# Patient Record
Sex: Female | Born: 1995 | Race: Black or African American | Hispanic: No | Marital: Single | Smoking: Current every day smoker
Health system: Southern US, Community
[De-identification: ages and names within clinical notes are randomized; demographics above are authoritative.]

## PROBLEM LIST (undated history)

## (undated) ENCOUNTER — Inpatient Hospital Stay (HOSPITAL_COMMUNITY): Payer: Self-pay

## (undated) ENCOUNTER — Emergency Department (HOSPITAL_COMMUNITY): Admission: EM | Payer: Medicaid Other | Source: Home / Self Care

## (undated) ENCOUNTER — Emergency Department (HOSPITAL_COMMUNITY): Discharge status: Absent without leave | Payer: Self-pay

## (undated) DIAGNOSIS — O1490 Unspecified pre-eclampsia, unspecified trimester: Secondary | ICD-10-CM

## (undated) DIAGNOSIS — N200 Calculus of kidney: Secondary | ICD-10-CM

## (undated) DIAGNOSIS — B999 Unspecified infectious disease: Secondary | ICD-10-CM

## (undated) DIAGNOSIS — R011 Cardiac murmur, unspecified: Secondary | ICD-10-CM

## (undated) DIAGNOSIS — J45909 Unspecified asthma, uncomplicated: Secondary | ICD-10-CM

## (undated) HISTORY — PX: NO PAST SURGERIES: SHX2092

## (undated) HISTORY — DX: Unspecified pre-eclampsia, unspecified trimester: O14.90

---
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
## 2013-01-31 ENCOUNTER — Emergency Department (HOSPITAL_COMMUNITY)
Admission: EM | Admit: 2013-01-31 | Discharge: 2013-01-31 | Disposition: A | Discharge status: Home / Self Care | Payer: Medicaid Other | Attending: Emergency Medicine | Admitting: Emergency Medicine

## 2013-01-31 ENCOUNTER — Encounter (HOSPITAL_COMMUNITY): Payer: Self-pay

## 2013-01-31 DIAGNOSIS — R011 Cardiac murmur, unspecified: Secondary | ICD-10-CM | POA: Insufficient documentation

## 2013-01-31 DIAGNOSIS — L259 Unspecified contact dermatitis, unspecified cause: Secondary | ICD-10-CM

## 2013-01-31 DIAGNOSIS — R21 Rash and other nonspecific skin eruption: Secondary | ICD-10-CM | POA: Insufficient documentation

## 2013-01-31 MED ORDER — METHYLPREDNISOLONE SODIUM SUCC 125 MG IJ SOLR
125.0000 mg | Freq: Once | INTRAMUSCULAR | Status: AC
  Administered 2013-01-31: 125 mg via INTRAMUSCULAR
  Filled 2013-01-31: qty 2

## 2013-01-31 MED ORDER — DIPHENHYDRAMINE HCL 25 MG PO CAPS
25.0000 mg | ORAL_CAPSULE | Freq: Once | ORAL | Status: AC
Start: 2013-01-31 — End: 2013-01-31
  Administered 2013-01-31: 25 mg via ORAL
  Filled 2013-01-31: qty 1

## 2013-01-31 NOTE — ED Provider Notes (Signed)
History  This chart was scribed for non-physician practitioner, Teressa Lower PA-C, working with Derwood Kaplan, MD by Ardeen Jourdain, ED Scribe. This patient was seen in room WTR6/WTR6 and the patient's care was started at 1655.  CSN: [id number]     Arrival date & time 01/31/13  1644   First MD Initiated Contact with Patient 01/31/13 1655     Chief Complaint  Patient presents with  . Facial Swelling  . Rash    Patient is a 17 y.o. female presenting with rash. The history is provided by the patient. No language interpreter was used.  Rash Location:  Face and shoulder/arm Facial rash location:  Face Shoulder/arm rash location:  L arm and R arm Quality: swelling   Severity:  Mild Onset quality:  Gradual Duration:  8 hours Timing:  Constant Progression:  Worsening Chronicity:  New Relieved by:  None tried Worsened by:  Nothing tried Ineffective treatments:  None tried Associated symptoms: no abdominal pain, no diarrhea, no fatigue, no fever, no headaches, no hoarse voice, no induration, no joint pain, no myalgias, no nausea, no periorbital edema, no shortness of breath, no sore throat, no throat swelling, no tongue swelling, no URI, not vomiting and not wheezing     HPI Comments: Cindy [name] is a 17 y.o. female who presents to the Emergency Department complaining of gradual onset, unchanging, constant facial swelling and rash to bilateral arms. Pt states she woke up this morning with the rash. She denies any new soaps, detergent, lotions and makeup. She reports taking Tylenol for the first time yesterday. She denies any trouble breathing, trouble swallowing and pain as associated symptoms.    Past Medical History  Diagnosis Date  . Heart murmur    No past surgical history on file. No family history on file. History  Substance Use Topics  . Smoking status: Never Smoker   . Smokeless tobacco: Not on file  . Alcohol Use: No   No OB history available.   Review of  Systems  Constitutional: Negative for fever and fatigue.  HENT: Positive for facial swelling. Negative for sore throat and hoarse voice.   Respiratory: Negative for shortness of breath and wheezing.   Gastrointestinal: Negative for nausea, vomiting, abdominal pain and diarrhea.  Musculoskeletal: Negative for myalgias and arthralgias.  Skin: Positive for rash.  Neurological: Negative for headaches.  All other systems reviewed and are negative.    Allergies  Review of patient's allergies indicates no known allergies.  Home Medications   Current Outpatient Rx  Name  Route  Sig  Dispense  Refill  . acetaminophen (TYLENOL) 500 MG tablet   Oral   Take 500 mg by mouth every 6 (six) hours as needed for pain.           Triage Vitals: BP 119/76  Pulse 99  Temp(Src) 99.6 F (37.6 C) (Oral)  Resp 21  SpO2 100%  Physical Exam  Nursing note and vitals reviewed. Constitutional: She is oriented to person, place, and time. She appears well-developed and well-nourished. No distress.  HENT:  Head: Normocephalic and atraumatic.  Mouth/Throat: Uvula is midline. No posterior oropharyngeal edema or posterior oropharyngeal erythema.  Mild facial swelling. No mucus al swelling. No oropharyngeal edema.   Eyes: EOM are normal. Pupils are equal, round, and reactive to light.  Neck: Normal range of motion. Neck supple. No tracheal deviation present.  Cardiovascular: Normal rate, regular rhythm and normal heart sounds.  Exam reveals no gallop and no friction rub.  No murmur heard. Pulmonary/Chest: Effort normal and breath sounds normal. No respiratory distress. She has no wheezes. She has no rales. She exhibits no tenderness.  Abdominal: Soft. She exhibits no distension.  Musculoskeletal: Normal range of motion. She exhibits no edema.  Neurological: She is alert and oriented to person, place, and time.  Skin: Skin is warm and dry.  Raised papules to upper extremities,face and trunk    Psychiatric: She has a normal mood and affect. Her behavior is normal.    ED Course   Procedures (including critical care time)  DIAGNOSTIC STUDIES: Oxygen Saturation is 100% on room air, normal by my interpretation.    COORDINATION OF CARE:  5:12 PM-Discussed treatment plan which includes steroids and benadryl with pt at bedside and pt agreed to plan.    Labs Reviewed - No data to display No results found. 1. Contact dermatitis     MDM  Rash consistent with allergic reaction:pt not having any respiratory symptoms:discussed with AV:[id number] relation to the acetaminophen dose taken yesterday as pt has never had in the past  I personally performed the services described in this documentation, which was scribed in my presence. The recorded information has been reviewed and is accurate.    Teressa Lower, NP 01/31/13 1753

## 2013-01-31 NOTE — ED Notes (Signed)
Pt reports she woke up with facial swelling, denies difficulty breathing or swallowing. Also reports rash on arms. Denies pain

## 2013-01-31 NOTE — ED Notes (Signed)
Facial swelling since 8am today denies trouble breathing

## 2013-02-03 NOTE — ED Provider Notes (Signed)
Medical screening examination/treatment/procedure(s) were performed by non-physician practitioner and as supervising physician I was immediately available for consultation/collaboration.  Helmi Hechavarria, MD 02/03/13 1423 

## 2013-03-22 ENCOUNTER — Emergency Department (HOSPITAL_COMMUNITY)
Admission: EM | Admit: 2013-03-22 | Discharge: 2013-03-23 | Disposition: A | Discharge status: Home / Self Care | Payer: No Typology Code available for payment source | Attending: Emergency Medicine | Admitting: Emergency Medicine

## 2013-03-22 ENCOUNTER — Encounter (HOSPITAL_COMMUNITY): Payer: Self-pay | Admitting: *Deleted

## 2013-03-22 DIAGNOSIS — S8001XA Contusion of right knee, initial encounter: Secondary | ICD-10-CM

## 2013-03-22 DIAGNOSIS — S8000XA Contusion of unspecified knee, initial encounter: Secondary | ICD-10-CM | POA: Insufficient documentation

## 2013-03-22 DIAGNOSIS — S139XXA Sprain of joints and ligaments of unspecified parts of neck, initial encounter: Secondary | ICD-10-CM | POA: Insufficient documentation

## 2013-03-22 DIAGNOSIS — Z79899 Other long term (current) drug therapy: Secondary | ICD-10-CM | POA: Insufficient documentation

## 2013-03-22 DIAGNOSIS — Y92410 Unspecified street and highway as the place of occurrence of the external cause: Secondary | ICD-10-CM | POA: Insufficient documentation

## 2013-03-22 DIAGNOSIS — R011 Cardiac murmur, unspecified: Secondary | ICD-10-CM | POA: Insufficient documentation

## 2013-03-22 DIAGNOSIS — Y9389 Activity, other specified: Secondary | ICD-10-CM | POA: Insufficient documentation

## 2013-03-22 DIAGNOSIS — S161XXA Strain of muscle, fascia and tendon at neck level, initial encounter: Secondary | ICD-10-CM
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
## 2013-03-23 MED ORDER — IBUPROFEN 200 MG PO TABS
600.0000 mg | ORAL_TABLET | Freq: Once | ORAL | Status: AC
  Administered 2013-03-23: 600 mg via ORAL
  Filled 2013-03-23: qty 3

## 2013-03-23 MED ORDER — METHOCARBAMOL 500 MG PO TABS
500.0000 mg | ORAL_TABLET | Freq: Two times a day (BID) | ORAL | Status: DC
Start: 2013-03-23 — End: 2013-07-03

## 2013-03-23 MED ORDER — IBUPROFEN 600 MG PO TABS: 600.0000 mg | ORAL_TABLET | Freq: Four times a day (QID) | ORAL | Status: DC | PRN

## 2013-03-23 NOTE — ED Provider Notes (Signed)
Medical screening examination/treatment/procedure(s) were performed by non-physician practitioner and as supervising physician I was immediately available for consultation/collaboration.  Tobin Witucki L Shakiara Lukic, MD 03/23/13 0448 

## 2013-07-03 ENCOUNTER — Encounter (HOSPITAL_COMMUNITY): Payer: Self-pay | Admitting: Emergency Medicine

## 2013-07-03 ENCOUNTER — Emergency Department (HOSPITAL_COMMUNITY)
Admission: EM | Admit: 2013-07-03 | Discharge: 2013-07-03 | Disposition: A | Discharge status: Home / Self Care | Payer: Medicaid Other | Attending: Emergency Medicine | Admitting: Emergency Medicine

## 2013-07-03 DIAGNOSIS — Z3202 Encounter for pregnancy test, result negative: Secondary | ICD-10-CM | POA: Insufficient documentation

## 2013-07-03 DIAGNOSIS — R809 Proteinuria, unspecified: Secondary | ICD-10-CM | POA: Insufficient documentation

## 2013-07-03 DIAGNOSIS — N390 Urinary tract infection, site not specified: Secondary | ICD-10-CM | POA: Insufficient documentation

## 2013-07-03 DIAGNOSIS — R011 Cardiac murmur, unspecified: Secondary | ICD-10-CM | POA: Insufficient documentation

## 2013-07-03 LAB — URINALYSIS, ROUTINE W REFLEX MICROSCOPIC
BILIRUBIN URINE: NEGATIVE
GLUCOSE, UA: NEGATIVE mg/dL
KETONES UR: 15 mg/dL — AB
Nitrite: POSITIVE — AB
PH: 6 (ref 5.0–8.0)
Protein, ur: >300 mg/dL — AB
Specific Gravity, Urine: 1.028 (ref 1.005–1.030)
Urobilinogen, UA: 0.2 mg/dL (ref 0.0–1.0)

## 2013-07-03 LAB — URINE MICROSCOPIC-ADD ON

## 2013-07-03 LAB — POCT PREGNANCY, URINE: Preg Test, Ur: NEGATIVE

## 2013-07-03 MED ORDER — PHENAZOPYRIDINE HCL 200 MG PO TABS: 200.0000 mg | ORAL_TABLET | Freq: Three times a day (TID) | ORAL | Status: DC

## 2013-07-03 MED ORDER — SULFAMETHOXAZOLE-TRIMETHOPRIM 800-160 MG PO TABS: 1.0000 | ORAL_TABLET | Freq: Two times a day (BID) | ORAL | Status: DC

## 2013-07-03 NOTE — Discharge Instructions (Signed)
You have been seen today for your complaint of pain with urination. Your lab work showed urine infection. Your discharge medications include: 1)Bactrim Please take all of your antibiotics until finished!   You may develop abdominal discomfort or diarrhea from the antibiotic.  You may help offset this with probiotics which you can buy or get in yogurt. Do not eat  or take the probiotics until 2 hours after your antibiotic.  2) Pyridium This medication will help relieve pain and burning but does not treat the infection.  Make sure that you wear a panty liner as it may stain your underwear.   There was protein in your urine. This may be a result of the infection and may resolve, however you will need to follow up in the next to weeks to recheck your urine with your primary care doctor.  Home care instructions are as follows:  1) please drink plenty of water, avoid tea and beverages with caffeine like coffee or soda 2) if you are sexually active, ,make sure to urinate immediately after intercourse Follow up with: your doctor or the emergency department Please seek immediate medical care if you develop any of the following symptoms: SEEK MEDICAL CARE IF:  You have back pain.  You develop a fever.  Your symptoms do not begin to resolve within 3 days.  SEEK IMMEDIATE MEDICAL CARE IF:  You have severe back pain or lower abdominal pain.  You develop chills.  You have nausea or vomiting.  You have continued burning or discomfort with urination.   Proteinuria Proteinuria is a condition in which urine contains more protein than is normal. Proteinuria is either a sign that your body is producing too much protein or a sign that there is a problem with the kidneys. Healthy kidneys prevent most substances that the body needs, including proteins, from leaving the bloodstream and ending up in urine. CAUSES  Proteinuria may be caused by a temporary event or condition such as stress, exercise, or fever,  and go away on its own. Proteinuria may also be a symptom of a more serious condition or disease. Causes of proteinuria include:  A kidney disease caused by:  Diabetes.  High blood pressure (hypertension).   A disease that affects the immune system, such as lupus.  A genetic disease, such as Alport's syndrome.  Medicines that damage the kidneys, such as long-term nonsteroidal anti-inflammatory drugs (NSAIDs).  Poisoning or exposure to toxic substances.  A reoccurring kidney or urinary infection.  Excess protein production in the body caused by:  Multiple myeloma.  Amyloidosis. SYMPTOMS You may have proteinuria without having noticeable symptoms. If there is a large amount of protein in your urine, your urine may look foamy. You may also notice swelling (edema) in your hands, feet, abdomen, or face. DIAGNOSIS To determine whether you have proteinuria, you will need to provide a urine sample. Your urine will then be tested for too much protein and the main blood protein albumin. If your test shows that you have proteinuria, you may need to take additional tests to determine its cause, how much protein is in your urine, and what type of protein is being lost. Tests may include:  Blood tests.  Urine tests.  A blood pressure measurement.  Imaging tests. TREATMENT  Treatment will depend on the cause of your proteinuria. Your caregiver will discuss treatment options with you after you have been diagnosed. If your proteinuria is mild or temporary, no treatment may be necessary. HOME CARE INSTRUCTIONS Ask  your caregiver if monitoring the level of protein in your urine at home using simple testing strips is appropriate for you. Early detection of proteinuria can lead to early and often successful treatment of the condition causing it. Document Released: 07/31/2005 Document Revised: 03/04/2012 Document Reviewed: 11/08/2011 [facility] Patient Information 2014 [place].

## 2013-07-03 NOTE — ED Provider Notes (Signed)
CSN: [id number]     Arrival date & time 07/03/13  1052 History   First MD Initiated Contact with Patient 07/03/13 1114     Chief Complaint  Patient presents with  . Dysuria   (Consider location/radiation/quality/duration/timing/severity/associated sxs/prior Treatment) HPI This is a 18 year old female presents the emergency department with chief complaint of symptoms of UTI.  Patient states that for the past few days she has had frequency of urination, urgency to urinate, dysuria, and suprapubic pain.  Patient denies hematuria or flank pain.  She has a previous history of urinary tract infections and states that this feels similar to her previous.  The patient denies any vaginal symptoms.  She lives in a monogamous relationship with one partner.  She is not using birth control.  She uses protection "most of the time."  Patient denies pain with sexual intercourse. Denies fevers, chills, myalgias, arthralgias. Denies DOE, SOB, chest tightness or pressure, radiation to left arm, jaw or back, or diaphoresis.  Denies headaches, light headedness, weakness, visual disturbances. Denies abdominal pain, nausea, vomiting, diarrhea or constipation.    Past Medical History  Diagnosis Date  . Heart murmur    History reviewed. No pertinent past surgical history. History reviewed. No pertinent family history. History  Substance Use Topics  . Smoking status: Never Smoker   . Smokeless tobacco: Not on file  . Alcohol Use: No   OB History   Grav Para Term Preterm Abortions TAB SAB Ect Mult Living                 Review of Systems Ten systems reviewed and are negative for acute change, except as noted in the HPI.   Allergies  Acetaminophen  Home Medications  No current outpatient prescriptions on file. BP 111/58  Pulse 70  Temp(Src) 98.4 F (36.9 C) (Oral)  Resp 18  Wt 120 lb (54.432 kg)  SpO2 100%  LMP 06/08/2013 Physical Exam Physical Exam  Nursing note and vitals  reviewed. Constitutional: She is oriented to person, place, and time. She appears well-developed and well-nourished. No distress.  HENT:  Head: Normocephalic and atraumatic.  Eyes: Conjunctivae normal and EOM are normal. Pupils are equal, round, and reactive to light. No scleral icterus.  Neck: Normal range of motion.  Cardiovascular: Normal rate, regular rhythm and normal heart sounds.  Exam reveals no gallop and no friction rub.   No murmur heard. Pulmonary/Chest: Effort normal and breath sounds normal. No respiratory distress.  Abdominal: Soft. Bowel sounds are normal. She exhibits no distension and no mass. There is no tenderness. There is no guarding.  Neurological: She is alert and oriented to person, place, and time.  Skin: Skin is warm and dry. She is not diaphoretic.    ED Course  Procedures (including critical care time) Labs Review Labs Reviewed  URINALYSIS, ROUTINE W REFLEX MICROSCOPIC  POCT PREGNANCY, URINE   Imaging Review No results found.  EKG Interpretation   None       MDM   1. UTI (lower urinary tract infection)   2. Proteinuria    Patient here with complaint of urinary symptoms.  Pregnancy is negative.  UA is pending.  .1:09 PM Filed Vitals:   07/03/13 1106 07/03/13 1255  BP: 111/58 110/57  Pulse: 70 75  Temp: 98.4 F (36.9 C) 98.3 F (36.8 C)  TempSrc: Oral Oral  Resp: 18   Weight: 120 lb (54.432 kg)   SpO2: 100% 100%   Recently the urinary tract infection on urinalysis.  She also has routine area greater than 300.  As advised the patient that she will need to follow up with her primary care for repeat urinalysis in the next week or 2.  Patient will be discharged with Bactrim and Pyridium for UTI. The patient appears reasonably screened and/or stabilized for discharge and I doubt any other medical condition or other [facility] requiring further screening, evaluation, or treatment in the ED at this time prior to discharge.   Arthor Captain,  PA-C 07/03/13 1349

## 2013-07-03 NOTE — ED Provider Notes (Signed)
Medical screening examination/treatment/procedure(s) were conducted as a shared visit with non-physician practitioner(s) and myself.  I personally evaluated the patient during the encounter.  EKG Interpretation   None         Darlys Galesavid Corvette Orser, MD 07/03/13 2106

## 2013-07-03 NOTE — ED Notes (Signed)
Pt in from home c/o dysuria & cloudy urine onset x 2 days, denies hematuria, denies back pain

## 2013-07-06 ENCOUNTER — Ambulatory Visit: Payer: Medicaid Other

## 2013-07-06 LAB — URINE CULTURE

## 2013-08-04 ENCOUNTER — Emergency Department (HOSPITAL_COMMUNITY): Payer: Medicaid Other

## 2013-08-04 ENCOUNTER — Emergency Department (HOSPITAL_COMMUNITY)
Admission: EM | Admit: 2013-08-04 | Discharge: 2013-08-04 | Disposition: A | Discharge status: Home / Self Care | Payer: Medicaid Other | Attending: Emergency Medicine | Admitting: Emergency Medicine

## 2013-08-04 ENCOUNTER — Encounter (HOSPITAL_COMMUNITY): Payer: Self-pay | Admitting: Emergency Medicine

## 2013-08-04 DIAGNOSIS — R011 Cardiac murmur, unspecified: Secondary | ICD-10-CM | POA: Insufficient documentation

## 2013-08-04 DIAGNOSIS — N200 Calculus of kidney: Secondary | ICD-10-CM

## 2013-08-04 DIAGNOSIS — Z3202 Encounter for pregnancy test, result negative: Secondary | ICD-10-CM | POA: Insufficient documentation

## 2013-08-04 LAB — CBC WITH DIFFERENTIAL/PLATELET
BASOS ABS: 0 10*3/uL (ref 0.0–0.1)
Basophils Relative: 1 % (ref 0–1)
EOS PCT: 1 % (ref 0–5)
Eosinophils Absolute: 0 10*3/uL (ref 0.0–0.7)
HEMATOCRIT: 31.1 % — AB (ref 36.0–46.0)
Hemoglobin: 10.1 g/dL — ABNORMAL LOW (ref 12.0–15.0)
LYMPHS ABS: 2.3 10*3/uL (ref 0.7–4.0)
LYMPHS PCT: 37 % (ref 12–46)
MCH: 27.7 pg (ref 26.0–34.0)
MCHC: 32.5 g/dL (ref 30.0–36.0)
MCV: 85.2 fL (ref 78.0–100.0)
MONO ABS: 0.6 10*3/uL (ref 0.1–1.0)
Monocytes Relative: 10 % (ref 3–12)
NEUTROS ABS: 3.2 10*3/uL (ref 1.7–7.7)
Neutrophils Relative %: 52 % (ref 43–77)
Platelets: 350 10*3/uL (ref 150–400)
RBC: 3.65 MIL/uL — AB (ref 3.87–5.11)
RDW: 15.8 % — ABNORMAL HIGH (ref 11.5–15.5)
WBC: 6.2 10*3/uL (ref 4.0–10.5)

## 2013-08-04 LAB — URINALYSIS, ROUTINE W REFLEX MICROSCOPIC
Bilirubin Urine: NEGATIVE
GLUCOSE, UA: NEGATIVE mg/dL
HGB URINE DIPSTICK: NEGATIVE
KETONES UR: NEGATIVE mg/dL
Leukocytes, UA: NEGATIVE
Nitrite: NEGATIVE
PROTEIN: NEGATIVE mg/dL
Specific Gravity, Urine: 1.024 (ref 1.005–1.030)
UROBILINOGEN UA: 0.2 mg/dL (ref 0.0–1.0)
pH: 8 (ref 5.0–8.0)

## 2013-08-04 LAB — POCT PREGNANCY, URINE: PREG TEST UR: NEGATIVE

## 2013-08-04 LAB — COMPREHENSIVE METABOLIC PANEL
ALT: 11 U/L (ref 0–35)
AST: 19 U/L (ref 0–37)
Albumin: 4 g/dL (ref 3.5–5.2)
Alkaline Phosphatase: 54 U/L (ref 39–117)
BILIRUBIN TOTAL: 0.3 mg/dL (ref 0.3–1.2)
BUN: 9 mg/dL (ref 6–23)
CALCIUM: 8.8 mg/dL (ref 8.4–10.5)
CHLORIDE: 104 meq/L (ref 96–112)
CO2: 23 meq/L (ref 19–32)
CREATININE: 0.85 mg/dL (ref 0.50–1.10)
GLUCOSE: 100 mg/dL — AB (ref 70–99)
Potassium: 3.8 mEq/L (ref 3.7–5.3)
SODIUM: 140 meq/L (ref 137–147)
Total Protein: 7.5 g/dL (ref 6.0–8.3)

## 2013-08-04 LAB — LIPASE, BLOOD: Lipase: 29 U/L (ref 11–59)

## 2013-08-04 MED ORDER — KETOROLAC TROMETHAMINE 30 MG/ML IJ SOLN
30.0000 mg | Freq: Once | INTRAMUSCULAR | Status: AC
  Administered 2013-08-04: 30 mg via INTRAVENOUS
  Filled 2013-08-04: qty 1

## 2013-08-04 MED ORDER — MORPHINE SULFATE 4 MG/ML IJ SOLN
4.0000 mg | Freq: Once | INTRAMUSCULAR | Status: AC
  Administered 2013-08-04: 4 mg via INTRAVENOUS
  Filled 2013-08-04: qty 1

## 2013-08-04 MED ORDER — NAPROXEN 500 MG PO TABS: 500.0000 mg | ORAL_TABLET | Freq: Two times a day (BID) | ORAL | Status: DC

## 2013-08-04 MED ORDER — TAMSULOSIN HCL 0.4 MG PO CAPS: 0.4000 mg | ORAL_CAPSULE | Freq: Every day | ORAL | Status: DC

## 2013-08-04 MED ORDER — ONDANSETRON HCL 4 MG/2ML IJ SOLN
4.0000 mg | Freq: Once | INTRAMUSCULAR | Status: AC
  Administered 2013-08-04: 4 mg via INTRAVENOUS
  Filled 2013-08-04: qty 2

## 2013-08-04 NOTE — ED Notes (Signed)
Pt given sprite per request 

## 2013-08-04 NOTE — ED Notes (Signed)
Pt from home c/o right upper quadrant abdominal pain that started today, pt states she started her period today and thought the pains were from her menstral but states that the pain is more severe and different. Pt denies any n/v/d, states she is having a normal period. Alert and oriented,nad noted.

## 2013-08-04 NOTE — ED Notes (Signed)
Pt escorted to restroom with little difficulty.

## 2013-08-04 NOTE — ED Notes (Signed)
Unable to obtain e-signature due to signature pad not working.  Pt verbalized understanding of d/c and f/u.  No additional questions by pt regarding d/c.  VSS.  Pt ambulatory at discharge.  Ride called.

## 2013-08-04 NOTE — ED Notes (Signed)
Patient unable to urinate for urinalysis at this moment

## 2013-08-04 NOTE — Discharge Instructions (Signed)
Kidney Stones  Kidney stones (urolithiasis) are deposits that form inside your kidneys. The intense pain is caused by the stone moving through the urinary tract. When the stone moves, the ureter goes into spasm around the stone. The stone is usually passed in the urine.   CAUSES   · A disorder that makes certain neck glands produce too much parathyroid hormone (primary hyperparathyroidism).  · A buildup of uric acid crystals, similar to gout in your joints.  · Narrowing (stricture) of the ureter.  · A kidney obstruction present at birth (congenital obstruction).  · Previous surgery on the kidney or ureters.  · Numerous kidney infections.  SYMPTOMS   · Feeling sick to your stomach (nauseous).  · Throwing up (vomiting).  · Blood in the urine (hematuria).  · Pain that usually spreads (radiates) to the groin.  · Frequency or urgency of urination.  DIAGNOSIS   · Taking a history and physical exam.  · Blood or urine tests.  · CT scan.  · Occasionally, an examination of the inside of the urinary bladder (cystoscopy) is performed.  TREATMENT   · Observation.  · Increasing your fluid intake.  · Extracorporeal shock wave lithotripsy This is a noninvasive procedure that uses shock waves to break up kidney stones.  · Surgery may be needed if you have severe pain or persistent obstruction. There are various surgical procedures. Most of the procedures are performed with the use of small instruments. Only small incisions are needed to accommodate these instruments, so recovery time is minimized.  The size, location, and chemical composition are all important variables that will determine the proper choice of action for you. Talk to your health care provider to better understand your situation so that you will minimize the risk of injury to yourself and your kidney.   HOME CARE INSTRUCTIONS   · Drink enough water and fluids to keep your urine clear or pale yellow. This will help you to pass the stone or stone fragments.  · Strain  all urine through the provided strainer. Keep all particulate matter and stones for your health care provider to see. The stone causing the pain may be as small as a grain of salt. It is very important to use the strainer each and every time you pass your urine. The collection of your stone will allow your health care provider to analyze it and verify that a stone has actually passed. The stone analysis will often identify what you can do to reduce the incidence of recurrences.  · Only take over-the-counter or prescription medicines for pain, discomfort, or fever as directed by your health care provider.  · Make a follow-up appointment with your health care provider as directed.  · Get follow-up X-rays if required. The absence of pain does not always mean that the stone has passed. It may have only stopped moving. If the urine remains completely obstructed, it can cause loss of kidney function or even complete destruction of the kidney. It is your responsibility to make sure X-rays and follow-ups are completed. Ultrasounds of the kidney can show blockages and the status of the kidney. Ultrasounds are not associated with any radiation and can be performed easily in a matter of minutes.  SEEK MEDICAL CARE IF:  · You experience pain that is progressive and unresponsive to any pain medicine you have been prescribed.  SEEK IMMEDIATE MEDICAL CARE IF:   · Pain cannot be controlled with the prescribed medicine.  · You have a fever   or shaking chills.  · The severity or intensity of pain increases over 18 hours and is not relieved by pain medicine.  · You develop a new onset of abdominal pain.  · You feel faint or pass out.  · You are unable to urinate.  MAKE SURE YOU:   · Understand these instructions.  · Will watch your condition.  · Will get help right away if you are not doing well or get worse.  Document Released: 06/10/2005 Document Revised: 02/10/2013 Document Reviewed: 11/11/2012  [employer]® Patient Information ©2014  [employer].

## 2013-08-04 NOTE — ED Provider Notes (Signed)
CSN: [id number]     Arrival date & time 08/04/13  1237 History   First MD Initiated Contact with Patient 08/04/13 1335     Chief Complaint  Patient presents with  . Abdominal Pain     (Consider location/radiation/quality/duration/timing/severity/associated sxs/prior Treatment) Patient is a 18 y.o. female presenting with abdominal pain. The history is provided by the patient.  Abdominal Pain  patient here complaining of 24 hours her right upper quadrant pain that began suddenly at rest. She denies any associated nodular vomiting or diarrhea. No fever or chills. No prior history of same and nothing makes her symptoms better or worse. No treatment used for this prior to arrival. Pain characterized as sharp. No associated pleurisy. No dysuria or hematuria. Symptoms have been persistently worse.  Past Medical History  Diagnosis Date  . Heart murmur    History reviewed. No pertinent past surgical history. No family history on file. History  Substance Use Topics  . Smoking status: Never Smoker   . Smokeless tobacco: Not on file  . Alcohol Use: No   OB History   Grav Para Term Preterm Abortions TAB SAB Ect Mult Living                 Review of Systems  Gastrointestinal: Positive for abdominal pain.  All other systems reviewed and are negative.      Allergies  Acetaminophen  Home Medications   Current Outpatient Rx  Name  Route  Sig  Dispense  Refill  . ibuprofen (ADVIL,MOTRIN) 200 MG tablet   Oral   Take 200 mg by mouth every 6 (six) hours as needed for moderate pain.          BP 111/62  Pulse 71  Temp(Src) 99.2 F (37.3 C) (Oral)  Resp 16  SpO2 100%  LMP 08/03/2013 Physical Exam  Nursing note and vitals reviewed. Constitutional: She is oriented to person, place, and time. She appears well-developed and well-nourished.  Non-toxic appearance. No distress.  HENT:  Head: Normocephalic and atraumatic.  Eyes: Conjunctivae, EOM and lids are normal. Pupils are equal,  round, and reactive to light.  Neck: Normal range of motion. Neck supple. No tracheal deviation present. No mass present.  Cardiovascular: Normal rate, regular rhythm and normal heart sounds.  Exam reveals no gallop.   No murmur heard. Pulmonary/Chest: Effort normal and breath sounds normal. No stridor. No respiratory distress. She has no decreased breath sounds. She has no wheezes. She has no rhonchi. She has no rales.  Abdominal: Soft. Normal appearance and bowel sounds are normal. She exhibits no distension. There is tenderness in the right upper quadrant. There is guarding. There is no rigidity, no rebound and no CVA tenderness.    Musculoskeletal: Normal range of motion. She exhibits no edema and no tenderness.  Neurological: She is alert and oriented to person, place, and time. She has normal strength. No cranial nerve deficit or sensory deficit. GCS eye subscore is 4. GCS verbal subscore is 5. GCS motor subscore is 6.  Skin: Skin is warm and dry. No abrasion and no rash noted.  Psychiatric: She has a normal mood and affect. Her speech is normal and behavior is normal.    ED Course  Procedures (including critical care time) Labs Review Labs Reviewed  CBC WITH DIFFERENTIAL - Abnormal; Notable for the following:    RBC 3.65 (*)    Hemoglobin 10.1 (*)    HCT 31.1 (*)    RDW 15.8 (*)    All  other components within normal limits  COMPREHENSIVE METABOLIC PANEL  LIPASE, BLOOD  URINALYSIS, ROUTINE W REFLEX MICROSCOPIC   Imaging Review No results found.  EKG Interpretation   None       MDM   Final diagnoses:  None    Patient given morphine for pain here. We assessed and pain continues. Abdominal ultrasound was suspicious for right-sided renal stone and this is confirmed by CT. Patient to be given referral to urology on call as well as pain medication.    Toy Baker, MD 08/04/13 [phone]

## 2013-08-04 NOTE — ED Notes (Signed)
Pt reminded of urine specimen needed, states she cannot go at this time.

## 2013-08-04 NOTE — ED Notes (Signed)
CT notified of preg test results.

## 2013-08-11 ENCOUNTER — Ambulatory Visit: Payer: Medicaid Other

## 2013-09-22 ENCOUNTER — Encounter (HOSPITAL_COMMUNITY): Payer: Self-pay | Admitting: Emergency Medicine

## 2013-09-22 ENCOUNTER — Emergency Department (HOSPITAL_COMMUNITY)
Admission: EM | Admit: 2013-09-22 | Discharge: 2013-09-22 | Disposition: A | Discharge status: Home / Self Care | Payer: Medicaid Other | Attending: Emergency Medicine | Admitting: Emergency Medicine

## 2013-09-22 DIAGNOSIS — N909 Noninflammatory disorder of vulva and perineum, unspecified: Secondary | ICD-10-CM

## 2013-09-22 DIAGNOSIS — N9089 Other specified noninflammatory disorders of vulva and perineum: Secondary | ICD-10-CM | POA: Insufficient documentation

## 2013-09-22 DIAGNOSIS — Z3202 Encounter for pregnancy test, result negative: Secondary | ICD-10-CM | POA: Insufficient documentation

## 2013-09-22 DIAGNOSIS — N72 Inflammatory disease of cervix uteri: Secondary | ICD-10-CM | POA: Insufficient documentation

## 2013-09-22 DIAGNOSIS — R300 Dysuria: Secondary | ICD-10-CM | POA: Insufficient documentation

## 2013-09-22 LAB — URINALYSIS, ROUTINE W REFLEX MICROSCOPIC
BILIRUBIN URINE: NEGATIVE
Glucose, UA: NEGATIVE mg/dL
HGB URINE DIPSTICK: NEGATIVE
Ketones, ur: NEGATIVE mg/dL
Nitrite: NEGATIVE
PROTEIN: NEGATIVE mg/dL
Specific Gravity, Urine: 1.019 (ref 1.005–1.030)
UROBILINOGEN UA: 0.2 mg/dL (ref 0.0–1.0)
pH: 7 (ref 5.0–8.0)

## 2013-09-22 LAB — URINE MICROSCOPIC-ADD ON

## 2013-09-22 LAB — WET PREP, GENITAL
TRICH WET PREP: NONE SEEN
Yeast Wet Prep HPF POC: NONE SEEN

## 2013-09-22 LAB — POC URINE PREG, ED: Preg Test, Ur: NEGATIVE

## 2013-09-22 MED ORDER — CEFTRIAXONE SODIUM 250 MG IJ SOLR
250.0000 mg | Freq: Once | INTRAMUSCULAR | Status: AC
  Administered 2013-09-22: 250 mg via INTRAMUSCULAR
  Filled 2013-09-22: qty 250

## 2013-09-22 MED ORDER — AZITHROMYCIN 250 MG PO TABS
1000.0000 mg | ORAL_TABLET | Freq: Once | ORAL | Status: AC
  Administered 2013-09-22: 1000 mg via ORAL
  Filled 2013-09-22: qty 4

## 2013-09-22 MED ORDER — LIDOCAINE HCL (PF) 1 % IJ SOLN
INTRAMUSCULAR | Status: AC
  Administered 2013-09-22: 0.9 mL
  Filled 2013-09-22: qty 5

## 2013-09-22 MED ORDER — LIDOCAINE 4 % EX CREA: 1.0000 "application " | TOPICAL_CREAM | CUTANEOUS | Status: DC | PRN

## 2013-09-22 MED ORDER — VALACYCLOVIR HCL 1 G PO TABS: 1000.0000 mg | ORAL_TABLET | Freq: Two times a day (BID) | ORAL | Status: AC

## 2013-09-22 NOTE — ED Notes (Signed)
PT c/o vaginal itching and discharge. Upon assessment, few small reddened blister looking areas noted to R side of labia. Thick white discharge present from vaginal area. LMP last week. Last date of intercourse yesterday.

## 2013-09-22 NOTE — ED Notes (Signed)
Pt in c/o rash to vaginal area and vaginal discharge, seen for same last week and OBGYN and states she was told everything was normal but states symptoms have continued

## 2013-09-22 NOTE — Discharge Instructions (Signed)
You cultures for a STDs including herpes are pending. You will be called if abnormal. Take valtrex as prescribed for possible herpes infection. Apply lidocaine cream for pain. Follow up with OB/GYN for recheck.    Gastritis, Adult Gastritis is soreness and swelling (inflammation) of the lining of the stomach. Gastritis can develop as a sudden onset (acute) or long-term (chronic) condition. If gastritis is not treated, it can lead to stomach bleeding and ulcers. CAUSES  Gastritis occurs when the stomach lining is weak or damaged. Digestive juices from the stomach then inflame the weakened stomach lining. The stomach lining may be weak or damaged due to viral or bacterial infections. One common bacterial infection is the Helicobacter pylori infection. Gastritis can also result from excessive alcohol consumption, taking certain medicines, or having too much acid in the stomach.  SYMPTOMS  In some cases, there are no symptoms. When symptoms are present, they may include:  Pain or a burning sensation in the upper abdomen.  Nausea.  Vomiting.  An uncomfortable feeling of fullness after eating. DIAGNOSIS  Your caregiver may suspect you have gastritis based on your symptoms and a physical exam. To determine the cause of your gastritis, your caregiver may perform the following:  Blood or stool tests to check for the H pylori bacterium.  Gastroscopy. A thin, flexible tube (endoscope) is passed down the esophagus and into the stomach. The endoscope has a light and camera on the end. Your caregiver uses the endoscope to view the inside of the stomach.  Taking a tissue sample (biopsy) from the stomach to examine under a microscope. TREATMENT  Depending on the cause of your gastritis, medicines may be prescribed. If you have a bacterial infection, such as an H pylori infection, antibiotics may be given. If your gastritis is caused by too much acid in the stomach, H2 blockers or antacids may be given.  Your caregiver may recommend that you stop taking aspirin, ibuprofen, or other nonsteroidal anti-inflammatory drugs (NSAIDs). HOME CARE INSTRUCTIONS  Only take over-the-counter or prescription medicines as directed by your caregiver.  If you were given antibiotic medicines, take them as directed. Finish them even if you start to feel better.  Drink enough fluids to keep your urine clear or pale yellow.  Avoid foods and drinks that make your symptoms worse, such as:  Caffeine or alcoholic drinks.  Chocolate.  Peppermint or mint flavorings.  Garlic and onions.  Spicy foods.  Citrus fruits, such as oranges, lemons, or limes.  Tomato-based foods such as sauce, chili, salsa, and pizza.  Fried and fatty foods.  Eat small, frequent meals instead of large meals. SEEK IMMEDIATE MEDICAL CARE IF:   You have black or dark red stools.  You vomit blood or material that looks like coffee grounds.  You are unable to keep fluids down.  Your abdominal pain gets worse.  You have a fever.  You do not feel better after 1 week.  You have any other questions or concerns. MAKE SURE YOU:  Understand these instructions.  Will watch your condition.  Will get help right away if you are not doing well or get worse. Document Released: 06/04/2001 Document Revised: 12/10/2011 Document Reviewed: 07/24/2011 [facility] FExitCare Patient Information 2014 [place], [place].

## 2013-09-22 NOTE — ED Provider Notes (Signed)
CSN: [id number]     Arrival date & time 09/22/13  1643 History   First MD Initiated Contact with Patient 09/22/13 1808     Chief Complaint  Patient presents with  . Rash  . Vaginal Discharge     (Consider location/radiation/quality/duration/timing/severity/associated sxs/prior Treatment) HPI Cindy [name] is a 18 y.o. female who presents emergency department complaining of abnormal vaginal discharge and vulvar rash. Patient states that she has had her discharge for approximately 2 weeks. She was seen by OB/GYN Dr. last week, she had a pelvic exam done, and her GYN Dr. told her everything looked normal. She said he tested her for STDs but unsure which STDs that they tested her for her. She states since then she developed lesions to her perineum. States very tender, pain with palpation, also burns with urination. She states she continues to have discharge. States she was also diagnosed with UTI, currently takes antibiotics for it. Denies urinary frequency or urgency. No fever, chills, malaise.   Past Medical History  Diagnosis Date  . Heart murmur    History reviewed. No pertinent past surgical history. History reviewed. No pertinent family history. History  Substance Use Topics  . Smoking status: Never Smoker   . Smokeless tobacco: Not on file  . Alcohol Use: No   OB History   Grav Para Term Preterm Abortions TAB SAB Ect Mult Living                 Review of Systems  Constitutional: Negative for fever and chills.  Respiratory: Negative for cough, chest tightness and shortness of breath.   Cardiovascular: Negative for chest pain, palpitations and leg swelling.  Gastrointestinal: Negative for nausea, vomiting, abdominal pain and diarrhea.  Genitourinary: Positive for dysuria, vaginal discharge and vaginal pain. Negative for flank pain, vaginal bleeding and pelvic pain.  Musculoskeletal: Negative for arthralgias, myalgias, neck pain and neck stiffness.  Skin: Negative for rash.   Neurological: Negative for dizziness, weakness and headaches.  All other systems reviewed and are negative.      Allergies  Acetaminophen  Home Medications   Current Outpatient Rx  Name  Route  Sig  Dispense  Refill  . naproxen (NAPROSYN) 500 MG tablet   Oral   Take 500 mg by mouth 2 (two) times daily with a meal.         . tamsulosin (FLOMAX) 0.4 MG CAPS capsule   Oral   Take 0.4 mg by mouth daily.          BP 115/60  Pulse 51  Temp(Src) 98.3 F (36.8 C) (Oral)  Resp 18  Ht 5\' 5"  (1.651 m)  Wt 113 lb (51.256 kg)  BMI 18.80 kg/m2  SpO2 99% Physical Exam  Nursing note and vitals reviewed. Constitutional: She appears well-developed and well-nourished. No distress.  HENT:  Head: Normocephalic.  Eyes: Conjunctivae are normal.  Neck: Neck supple.  Cardiovascular: Normal rate, regular rhythm and normal heart sounds.   Pulmonary/Chest: Effort normal and breath sounds normal. No respiratory distress. She has no wheezes. She has no rales.  Abdominal: Soft. Bowel sounds are normal. She exhibits no distension. There is no tenderness. There is no rebound.  Genitourinary:  4 small ulcerated lesions to the vulva around the vaginal opening, no drainage, no vesicles. Tender to palpation. Normal vaginal canal. No CMT. No uterine or adnexal tenderness.   Musculoskeletal: She exhibits no edema.  Neurological: She is alert.  Skin: Skin is warm and dry.  Psychiatric: She has  a normal mood and affect. Her behavior is normal.    ED Course  Procedures (including critical care time) Labs Review Labs Reviewed  WET PREP, GENITAL - Abnormal; Notable for the following:    Clue Cells Wet Prep HPF POC FEW (*)    WBC, Wet Prep HPF POC MANY (*)    All other components within normal limits  URINALYSIS, ROUTINE W REFLEX MICROSCOPIC - Abnormal; Notable for the following:    APPearance CLOUDY (*)    Leukocytes, UA SMALL (*)    All other components within normal limits  URINE  MICROSCOPIC-ADD ON - Abnormal; Notable for the following:    Squamous Epithelial / LPF FEW (*)    All other components within normal limits  GC/CHLAMYDIA PROBE AMP  URINE CULTURE  HERPES SIMPLEX VIRUS CULTURE  RPR  POC URINE PREG, ED   Imaging Review No results found.   EKG Interpretation None      MDM   Final diagnoses:  Cervicitis  Lesion of female perineum    Pt with abnormal vaginal discharge and vulval lesions. Pt is concerned about herpes. i scraped and obtained viral cultures. Pt is afebrile. Many wbcs on wet prep. Will cover with rocephin 250mg  IM and zithromax 1g PO. Will d/c home with close gyn follow up.    Filed Vitals:   09/22/13 1647 09/22/13 1930  BP: 115/60 121/57  Pulse: 51 66  Temp: 98.3 F (36.8 C) 99.1 F (37.3 C)  TempSrc: Oral Oral  Resp: 18 16  Height: 5\' 5"  (1.651 m)   Weight: 113 lb (51.256 kg)   SpO2: 99% 100%      Lottie Mussel, PA-C 09/22/13 2014

## 2013-09-22 NOTE — ED Provider Notes (Signed)
Medical screening examination/treatment/procedure(s) were performed by non-physician practitioner and as supervising physician I was immediately available for consultation/collaboration.   EKG Interpretation None      Granville Whitefield, MD, FACEP   Damaria Vachon L Baruc Tugwell, MD 09/22/13 2026 

## 2013-09-23 LAB — URINE CULTURE
COLONY COUNT: NO GROWTH
Culture: NO GROWTH

## 2013-09-23 LAB — RPR: RPR Ser Ql: NONREACTIVE

## 2013-09-23 LAB — GC/CHLAMYDIA PROBE AMP
CT PROBE, AMP APTIMA: NEGATIVE
GC Probe RNA: NEGATIVE

## 2013-09-25 LAB — HERPES SIMPLEX VIRUS CULTURE
Culture: DETECTED
Special Requests: NORMAL

## 2013-11-28 ENCOUNTER — Encounter (HOSPITAL_COMMUNITY): Payer: Self-pay | Admitting: Emergency Medicine

## 2013-11-28 ENCOUNTER — Emergency Department (HOSPITAL_COMMUNITY)
Admission: EM | Admit: 2013-11-28 | Discharge: 2013-11-29 | Disposition: A | Discharge status: Home / Self Care | Payer: Medicaid Other | Attending: Emergency Medicine | Admitting: Emergency Medicine

## 2013-11-28 DIAGNOSIS — D649 Anemia, unspecified: Secondary | ICD-10-CM

## 2013-11-28 DIAGNOSIS — Z3202 Encounter for pregnancy test, result negative: Secondary | ICD-10-CM | POA: Insufficient documentation

## 2013-11-28 DIAGNOSIS — Z791 Long term (current) use of non-steroidal anti-inflammatories (NSAID): Secondary | ICD-10-CM | POA: Insufficient documentation

## 2013-11-28 DIAGNOSIS — R011 Cardiac murmur, unspecified: Secondary | ICD-10-CM | POA: Insufficient documentation

## 2013-11-28 DIAGNOSIS — Z87442 Personal history of urinary calculi: Secondary | ICD-10-CM | POA: Insufficient documentation

## 2013-11-28 DIAGNOSIS — N946 Dysmenorrhea, unspecified: Secondary | ICD-10-CM | POA: Insufficient documentation

## 2013-11-28 DIAGNOSIS — R110 Nausea: Secondary | ICD-10-CM | POA: Insufficient documentation

## 2013-11-28 DIAGNOSIS — Z79899 Other long term (current) drug therapy: Secondary | ICD-10-CM | POA: Insufficient documentation

## 2013-11-28 HISTORY — DX: Calculus of kidney: N20.0

## 2013-11-28 LAB — CBC WITH DIFFERENTIAL/PLATELET
BASOS PCT: 1 % (ref 0–1)
Basophils Absolute: 0 10*3/uL (ref 0.0–0.1)
EOS ABS: 0 10*3/uL (ref 0.0–0.7)
Eosinophils Relative: 1 % (ref 0–5)
HCT: 29.5 % — ABNORMAL LOW (ref 36.0–46.0)
Hemoglobin: 9.5 g/dL — ABNORMAL LOW (ref 12.0–15.0)
Lymphocytes Relative: 44 % (ref 12–46)
Lymphs Abs: 2.1 10*3/uL (ref 0.7–4.0)
MCH: 27.4 pg (ref 26.0–34.0)
MCHC: 32.2 g/dL (ref 30.0–36.0)
MCV: 85 fL (ref 78.0–100.0)
MONOS PCT: 13 % — AB (ref 3–12)
Monocytes Absolute: 0.6 10*3/uL (ref 0.1–1.0)
NEUTROS PCT: 41 % — AB (ref 43–77)
Neutro Abs: 1.9 10*3/uL (ref 1.7–7.7)
PLATELETS: 336 10*3/uL (ref 150–400)
RBC: 3.47 MIL/uL — ABNORMAL LOW (ref 3.87–5.11)
RDW: 15.4 % (ref 11.5–15.5)
WBC: 4.7 10*3/uL (ref 4.0–10.5)

## 2013-11-28 LAB — URINALYSIS, ROUTINE W REFLEX MICROSCOPIC
Bilirubin Urine: NEGATIVE
Glucose, UA: NEGATIVE mg/dL
Ketones, ur: 15 mg/dL — AB
NITRITE: NEGATIVE
Protein, ur: 100 mg/dL — AB
SPECIFIC GRAVITY, URINE: 1.029 (ref 1.005–1.030)
UROBILINOGEN UA: 1 mg/dL (ref 0.0–1.0)
pH: 6 (ref 5.0–8.0)

## 2013-11-28 LAB — LIPASE, BLOOD: LIPASE: 55 U/L (ref 11–59)

## 2013-11-28 LAB — COMPREHENSIVE METABOLIC PANEL
ALK PHOS: 45 U/L (ref 39–117)
ALT: 8 U/L (ref 0–35)
AST: 17 U/L (ref 0–37)
Albumin: 3.9 g/dL (ref 3.5–5.2)
BUN: 9 mg/dL (ref 6–23)
CO2: 26 mEq/L (ref 19–32)
Calcium: 9.5 mg/dL (ref 8.4–10.5)
Chloride: 102 mEq/L (ref 96–112)
Creatinine, Ser: 0.85 mg/dL (ref 0.50–1.10)
GFR calc Af Amer: >90 mL/min (ref >90)
GFR calc non Af Amer: >90 mL/min (ref >90)
Glucose, Bld: 93 mg/dL (ref 70–99)
Potassium: 3.9 mEq/L (ref 3.7–5.3)
SODIUM: 140 meq/L (ref 137–147)
TOTAL PROTEIN: 7.4 g/dL (ref 6.0–8.3)
Total Bilirubin: 0.3 mg/dL (ref 0.3–1.2)

## 2013-11-28 LAB — URINE MICROSCOPIC-ADD ON

## 2013-11-28 LAB — PREGNANCY, URINE: Preg Test, Ur: NEGATIVE

## 2013-11-28 MED ORDER — IBUPROFEN 200 MG PO TABS
600.0000 mg | ORAL_TABLET | Freq: Once | ORAL | Status: AC
  Administered 2013-11-29: 600 mg via ORAL
  Filled 2013-11-28: qty 3

## 2013-11-28 NOTE — ED Notes (Signed)
MD at bedside. 

## 2013-11-28 NOTE — ED Notes (Signed)
Pt. reports low abdominal pain/cramping  onset last week with nausea , denies emesis or diarrhea . No fever or chills.

## 2013-11-28 NOTE — ED Provider Notes (Signed)
CSN: [id number]     Arrival date & time 11/28/13  2123 History   First MD Initiated Contact with Patient 11/28/13 2334     Chief Complaint  Patient presents with  . Abdominal Pain     (Consider location/radiation/quality/duration/timing/severity/associated sxs/prior Treatment) Patient is a 18 y.o. female presenting with abdominal pain. The history is provided by the patient.  Abdominal Pain She comes in with suprapubic pain for the last 3 days. Pain is crampy and moderate in intensity. She rates it at 6/10. Nothing makes it better nothing makes it worse. There is associated nausea but no vomiting. She has noted some urinary urgency and frequency but no dysuria. She is currently on her menses which started about the same time. Menstrual flow is heavier than normal and she states that she is going through about 4 pads a day with passing some clots. She has problems with underlying irregular menses. She denies fever but has had chills and sweats. She denies constipation or diarrhea. She states that she uses condoms for contraception and denies any unprotected sex. She had been in the ED 2 months ago for painful lesions which are treated as herpes and have resolved. She also states that she had been treated for a kidney stone and was wondering whether she might have another kidney stone. She denies any flank pain.  Past Medical History  Diagnosis Date  . Heart murmur   . Kidney stone    History reviewed. No pertinent past surgical history. No family history on file. History  Substance Use Topics  . Smoking status: Never Smoker   . Smokeless tobacco: Not on file  . Alcohol Use: No   OB History   Grav Para Term Preterm Abortions TAB SAB Ect Mult Living                 Review of Systems  Gastrointestinal: Positive for abdominal pain.  All other systems reviewed and are negative.     Allergies  Acetaminophen  Home Medications   Prior to Admission medications   Medication Sig Start  Date End Date Taking? Authorizing Provider  lidocaine (LMX) 4 % cream Apply 1 application topically as needed. 09/22/13   Tatyana A Kirichenko, PA-C  naproxen (NAPROSYN) 500 MG tablet Take 500 mg by mouth 2 (two) times daily with a meal.    Historical Provider, MD  tamsulosin (FLOMAX) 0.4 MG CAPS capsule Take 0.4 mg by mouth daily.    Historical Provider, MD   BP 107/62  Pulse 97  Temp(Src) 98.7 F (37.1 C) (Oral)  Resp 14  Ht 5\' 5"  (1.651 m)  Wt 113 lb (51.256 kg)  BMI 18.80 kg/m2  SpO2 100%  LMP 11/27/2013 Physical Exam  Nursing note and vitals reviewed.  18 year old female, resting comfortably and in no acute distress. Vital signs are normal. Oxygen saturation is 100%, which is normal. Head is normocephalic and atraumatic. PERRLA, EOMI. Oropharynx is clear. Neck is nontender and supple without adenopathy or JVD. Back is nontender and there is no CVA tenderness. Lungs are clear without rales, wheezes, or rhonchi. Chest is nontender. Heart has regular rate and rhythm without murmur. Abdomen is soft, flat, with mild suprapubic tenderness. There is no rebound or guarding. There are no masses or hepatosplenomegaly and peristalsis is normoactive. Pelvic: Normal external female genitalia. Small amount of blood present in the vaginal vault without active bleeding. Cervix appears normal. Fundus is normal size and position.. No adnexal masses but there is mild bilateral  adnexal tenderness. Extremities have no cyanosis or edema, full range of motion is present. Skin is warm and dry without rash. Neurologic: Mental status is normal, cranial nerves are intact, there are no motor or sensory deficits.  ED Course  Procedures (including critical care time) Labs Review Results for orders placed during the hospital encounter of 11/28/13  WET PREP, GENITAL      Result Value Ref Range   Yeast Wet Prep HPF POC NONE SEEN  NONE SEEN   Trich, Wet Prep NONE SEEN  NONE SEEN   Clue Cells Wet Prep HPF POC  FEW (*) NONE SEEN   WBC, Wet Prep HPF POC FEW (*) NONE SEEN  CBC WITH DIFFERENTIAL      Result Value Ref Range   WBC 4.7  4.0 - 10.5 K/uL   RBC 3.47 (*) 3.87 - 5.11 MIL/uL   Hemoglobin 9.5 (*) 12.0 - 15.0 g/dL   HCT 16.1 (*) 09.6 - 04.5 %   MCV 85.0  78.0 - 100.0 fL   MCH 27.4  26.0 - 34.0 pg   MCHC 32.2  30.0 - 36.0 g/dL   RDW 40.9  81.1 - 91.4 %   Platelets 336  150 - 400 K/uL   Neutrophils Relative % 41 (*) 43 - 77 %   Neutro Abs 1.9  1.7 - 7.7 K/uL   Lymphocytes Relative 44  12 - 46 %   Lymphs Abs 2.1  0.7 - 4.0 K/uL   Monocytes Relative 13 (*) 3 - 12 %   Monocytes Absolute 0.6  0.1 - 1.0 K/uL   Eosinophils Relative 1  0 - 5 %   Eosinophils Absolute 0.0  0.0 - 0.7 K/uL   Basophils Relative 1  0 - 1 %   Basophils Absolute 0.0  0.0 - 0.1 K/uL  COMPREHENSIVE METABOLIC PANEL      Result Value Ref Range   Sodium 140  137 - 147 mEq/L   Potassium 3.9  3.7 - 5.3 mEq/L   Chloride 102  96 - 112 mEq/L   CO2 26  19 - 32 mEq/L   Glucose, Bld 93  70 - 99 mg/dL   BUN 9  6 - 23 mg/dL   Creatinine, Ser 7.82  0.50 - 1.10 mg/dL   Calcium 9.5  8.4 - 95.6 mg/dL   Total Protein 7.4  6.0 - 8.3 g/dL   Albumin 3.9  3.5 - 5.2 g/dL   AST 17  0 - 37 U/L   ALT 8  0 - 35 U/L   Alkaline Phosphatase 45  39 - 117 U/L   Total Bilirubin 0.3  0.3 - 1.2 mg/dL   GFR calc non Af Amer >90  >90 mL/min   GFR calc Af Amer >90  >90 mL/min  LIPASE, BLOOD      Result Value Ref Range   Lipase 55  11 - 59 U/L  PREGNANCY, URINE      Result Value Ref Range   Preg Test, Ur NEGATIVE  NEGATIVE  URINALYSIS, ROUTINE W REFLEX MICROSCOPIC      Result Value Ref Range   Color, Urine YELLOW  YELLOW   APPearance CLOUDY (*) CLEAR   Specific Gravity, Urine 1.029  1.005 - 1.030   pH 6.0  5.0 - 8.0   Glucose, UA NEGATIVE  NEGATIVE mg/dL   Hgb urine dipstick LARGE (*) NEGATIVE   Bilirubin Urine NEGATIVE  NEGATIVE   Ketones, ur 15 (*) NEGATIVE mg/dL   Protein, ur 213 (*)  NEGATIVE mg/dL   Urobilinogen, UA 1.0  0.0 - 1.0  mg/dL   Nitrite NEGATIVE  NEGATIVE   Leukocytes, UA TRACE (*) NEGATIVE  URINE MICROSCOPIC-ADD ON      Result Value Ref Range   Squamous Epithelial / LPF FEW (*) RARE   WBC, UA 3-6  <3 WBC/hpf   RBC / HPF 21-50  <3 RBC/hpf   Bacteria, UA RARE  RARE   Urine-Other MUCOUS PRESENT     MDM   Final diagnoses:  Dysmenorrhea  Anemia    Dysmenorrhea which appears to be somewhat mild. Old records are reviewed and she was evaluated for possible herpes simplex infection on April 1. At that time, GC and Chlamydia and RPR were all negative. Herpes culture was ordered but I do not see any result. She was empirically treated with ceftriaxone and azithromycin as well as valacyclovir. Although she states her menses are heavier than normal, it does not sound as if it is something that requires immediate hormonal manipulation. [facility] and given a prescription for tramadol for pain. Based on the amount of bleeding on exam, I do not feel she needs emergent hormonal manipulation.Dione Booze.    Shavaughn Seidl, MD 11/29/13 [phone]

## 2013-11-29 LAB — HIV ANTIBODY (ROUTINE TESTING W REFLEX): HIV: NONREACTIVE

## 2013-11-29 LAB — WET PREP, GENITAL
Trich, Wet Prep: NONE SEEN
Yeast Wet Prep HPF POC: NONE SEEN

## 2013-11-29 LAB — GC/CHLAMYDIA PROBE AMP
CT Probe RNA: NEGATIVE
GC PROBE AMP APTIMA: NEGATIVE

## 2013-11-29 LAB — RPR

## 2013-11-29 MED ORDER — TRAMADOL HCL 50 MG PO TABS: 50.0000 mg | ORAL_TABLET | Freq: Four times a day (QID) | ORAL | Status: DC | PRN

## 2013-11-29 NOTE — Discharge Instructions (Signed)
Dysmenorrhea °Menstrual cramps (dysmenorrhea) are caused by the muscles of the uterus tightening (contracting) during a menstrual period. For some women, this discomfort is merely bothersome. For others, dysmenorrhea can be severe enough to interfere with everyday activities for a few days each month. °Primary dysmenorrhea is menstrual cramps that last a couple of days when you start having menstrual periods or soon after. This often begins after a teenager starts having her period. As a woman gets older or has a baby, the cramps will usually lessen or disappear. Secondary dysmenorrhea begins later in life, lasts longer, and the pain may be stronger than primary dysmenorrhea. The pain may start before the period and last a few days after the period.  °CAUSES  °Dysmenorrhea is usually caused by an underlying problem, such as: °· The tissue lining the uterus grows outside of the uterus in other areas of the body (endometriosis). °· The endometrial tissue, which normally lines the uterus, is found in or grows into the muscular walls of the uterus (adenomyosis). °· The pelvic blood vessels are engorged with blood just before the menstrual period (pelvic congestive syndrome). °· Overgrowth of cells (polyps) in the lining of the uterus or cervix. °· Falling down of the uterus (prolapse) because of loose or stretched ligaments. °· Depression. °· Bladder problems, infection, or inflammation. °· Problems with the intestine, a tumor, or irritable bowel syndrome. °· Cancer of the female organs or bladder. °· A severely tipped uterus. °· A very tight opening or closed cervix. °· Noncancerous tumors of the uterus (fibroids). °· Pelvic inflammatory disease (PID). °· Pelvic scarring (adhesions) from a previous surgery. °· Ovarian cyst. °· An intrauterine device (IUD) used for birth control. °RISK FACTORS °You may be at greater risk of dysmenorrhea if: °· You are younger than age 30. °· You started puberty early. °· You have  irregular or heavy bleeding. °· You have never given birth. °· You have a family history of this problem. °· You are a smoker. °SIGNS AND SYMPTOMS  °· Cramping or throbbing pain in your lower abdomen. °· Headaches. °· Lower back pain. °· Nausea or vomiting. °· Diarrhea. °· Sweating or dizziness. °· Loose stools. °DIAGNOSIS  °A diagnosis is based on your history, symptoms, physical exam, diagnostic tests, or procedures. Diagnostic tests or procedures may include: °· Blood tests. °· Ultrasonography. °· An examination of the lining of the uterus (dilation and curettage, D&C). °· An examination inside your abdomen or pelvis with a scope (laparoscopy). °· X-rays. °· CT scan. °· MRI. °· An examination inside the bladder with a scope (cystoscopy). °· An examination inside the intestine or stomach with a scope (colonoscopy, gastroscopy). °TREATMENT  °Treatment depends on the cause of the dysmenorrhea. Treatment may include: °· Pain medicine prescribed by your health care provider. °· Birth control pills or an IUD with progesterone hormone in it. °· Hormone replacement therapy. °· Nonsteroidal anti-inflammatory drugs (NSAIDs). These may help stop the production of prostaglandins. °· Surgery to remove adhesions, endometriosis, ovarian cyst, or fibroids. °· Removal of the uterus (hysterectomy). °· Progesterone shots to stop the menstrual period. °· Cutting the nerves on the sacrum that go to the female organs (presacral neurectomy). °· Electric current to the sacral nerves (sacral nerve stimulation). °· Antidepressant medicine. °· Psychiatric therapy, counseling, or group therapy. °· Exercise and physical therapy. °· Meditation and yoga therapy. °· Acupuncture. °HOME CARE INSTRUCTIONS  °· Only take over-the-counter or prescription medicines as directed by your health care provider. °· Place a heating pad   or hot water bottle on your lower back or abdomen. Do not sleep with the heating pad.  Use aerobic exercises, walking,  swimming, biking, and other exercises to help lessen the cramping.  Massage to the lower back or abdomen may help.  Stop smoking.  Avoid alcohol and caffeine. SEEK MEDICAL CARE IF:   Your pain does not get better with medicine.  You have pain with sexual intercourse.  Your pain increases and is not controlled with medicines.  You have abnormal vaginal bleeding with your period.  You develop nausea or vomiting with your period that is not controlled with medicine. SEEK IMMEDIATE MEDICAL CARE IF:  You pass out.  Document Released: 06/10/2005 Document Revised: 02/10/2013 Document Reviewed: 11/26/2012 [facility] Patient Information 2014 [place], [place].   Anemia, Nonspecific Anemia is a condition in which the concentration of red blood cells or hemoglobin in the blood is below normal. Hemoglobin is a substance in red blood cells that carries oxygen to the tissues of the body. Anemia results in not enough oxygen reaching these tissues.  CAUSES  Common causes of anemia include:   Excessive bleeding. Bleeding may be internal or external. This includes excessive bleeding from periods (in women) or from the intestine.   Poor nutrition.   Chronic kidney, thyroid, and liver disease.  Bone marrow disorders that decrease red blood cell production.  Cancer and treatments for cancer.  HIV, AIDS, and their treatments.  Spleen problems that increase red blood cell destruction.  Blood disorders.  Excess destruction of red blood cells due to infection, medicines, and autoimmune disorders. SIGNS AND SYMPTOMS   Minor weakness.   Dizziness.   Headache.  Palpitations.   Shortness of breath, especially with exercise.   Paleness.  Cold sensitivity.  Indigestion.  Nausea.  Difficulty sleeping.  Difficulty concentrating. Symptoms may occur suddenly or they may develop slowly.  DIAGNOSIS  Additional blood tests are often needed. These help your health care provider  determine the best treatment. Your health care provider will check your stool for blood and look for other causes of blood loss.  TREATMENT  Treatment varies depending on the cause of the anemia. Treatment can include:   Supplements of iron, vitamin B12, or folic acid.   Hormone medicines.   A blood transfusion. This may be needed if blood loss is severe.   Hospitalization. This may be needed if there is significant continual blood loss.   Dietary changes.  Spleen removal. HOME CARE INSTRUCTIONS Keep all follow-up appointments. It often takes many weeks to correct anemia, and having your health care provider check on your condition and your response to treatment is very important. SEEK IMMEDIATE MEDICAL CARE IF:   You develop extreme weakness, shortness of breath, or chest pain.   You become dizzy or have trouble concentrating.  You develop heavy vaginal bleeding.   You develop a rash.   You have bloody or black, tarry stools.   You faint.   You vomit up blood.   You vomit repeatedly.   You have abdominal pain.  You have a fever or persistent symptoms for more than 2 3 days.   You have a fever and your symptoms suddenly get worse.   You are dehydrated.  MAKE SURE YOU:  Understand these instructions.  Will watch your condition.  Will get help right away if you are not doing well or get worse. Document Released: 07/18/2004 Document Revised: 02/10/2013 Document Reviewed: 12/04/2012 [facility] Patient Information 2014 [place], [place].  Tramadol tablets What  is this medicine? TRAMADOL (TRA ma dole) is a pain reliever. It is used to treat moderate to severe pain in adults. This medicine may be used for other purposes; ask your health care provider or pharmacist if you have questions. COMMON BRAND NAME(S): Ultram What should I tell my health care provider before I take this medicine? They need to know if you have any of these conditions: -brain  tumor -depression -drug abuse or addiction -head injury -if you frequently drink alcohol containing drinks -kidney disease or trouble passing urine -liver disease -lung disease, asthma, or breathing problems -seizures or epilepsy -suicidal thoughts, plans, or attempt; a previous suicide attempt by you or a family member -an unusual or allergic reaction to tramadol, codeine, other medicines, foods, dyes, or preservatives -pregnant or trying to get pregnant -breast-feeding How should I use this medicine? Take this medicine by mouth with a full glass of water. Follow the directions on the prescription label. If the medicine upsets your stomach, take it with food or milk. Do not take more medicine than you are told to take. Talk to your pediatrician regarding the use of this medicine in children. Special care may be needed. Overdosage: If you think you have taken too much of this medicine contact a poison control center or emergency room at once. NOTE: This medicine is only for you. Do not share this medicine with others. What if I miss a dose? If you miss a dose, take it as soon as you can. If it is almost time for your next dose, take only that dose. Do not take double or extra doses. What may interact with this medicine? Do not take this medicine with any of the following medications: -MAOIs like Carbex, Eldepryl, Marplan, Nardil, and Parnate This medicine may also interact with the following medications: -alcohol or medicines that contain alcohol -antihistamines -benzodiazepines -bupropion -carbamazepine or oxcarbazepine -clozapine -cyclobenzaprine -digoxin -furazolidone -linezolid -medicines for depression, anxiety, or psychotic disturbances -medicines for migraine headache like almotriptan, eletriptan, frovatriptan, naratriptan, rizatriptan, sumatriptan, zolmitriptan -medicines for pain like pentazocine, buprenorphine, butorphanol, meperidine, nalbuphine, and  propoxyphene -medicines for sleep -muscle relaxants -naltrexone -phenobarbital -phenothiazines like perphenazine, thioridazine, chlorpromazine, mesoridazine, fluphenazine, prochlorperazine, promazine, and trifluoperazine -procarbazine -warfarin This list may not describe all possible interactions. Give your health care provider a list of all the medicines, herbs, non-prescription drugs, or dietary supplements you use. Also tell them if you smoke, drink alcohol, or use illegal drugs. Some items may interact with your medicine. What should I watch for while using this medicine? Tell your doctor or health care professional if your pain does not go away, if it gets worse, or if you have new or a different type of pain. You may develop tolerance to the medicine. Tolerance means that you will need a higher dose of the medicine for pain relief. Tolerance is normal and is expected if you take this medicine for a long time. Do not suddenly stop taking your medicine because you may develop a severe reaction. Your body becomes used to the medicine. This does NOT mean you are addicted. Addiction is a behavior related to getting and using a drug for a non-medical reason. If you have pain, you have a medical reason to take pain medicine. Your doctor will tell you how much medicine to take. If your doctor wants you to stop the medicine, the dose will be slowly lowered over time to avoid any side effects. You may get drowsy or dizzy. Do not drive, use machinery, or  do anything that needs mental alertness until you know how this medicine affects you. Do not stand or sit up quickly, especially if you are an older patient. This reduces the risk of dizzy or fainting spells. Alcohol can increase or decrease the effects of this medicine. Avoid alcoholic drinks. You may have constipation. Try to have a bowel movement at least every 2 to 3 days. If you do not have a bowel movement for 3 days, call your doctor or health care  professional. Your mouth may get dry. Chewing sugarless gum or sucking hard candy, and drinking plenty of water may help. Contact your doctor if the problem does not go away or is severe. What side effects may I notice from receiving this medicine? Side effects that you should report to your doctor or health care professional as soon as possible: -allergic reactions like skin rash, itching or hives, swelling of the face, lips, or tongue -breathing difficulties, wheezing -confusion -itching -light headedness or fainting spells -redness, blistering, peeling or loosening of the skin, including inside the mouth -seizures Side effects that usually do not require medical attention (report to your doctor or health care professional if they continue or are bothersome): -constipation -dizziness -drowsiness -headache -nausea, vomiting This list may not describe all possible side effects. Call your doctor for medical advice about side effects. You may report side effects to FDA at 1-800-FDA-1088. Where should I keep my medicine? Keep out of the reach of children. Store at room temperature between 15 and 30 degrees C (59 and 86 degrees F). Keep container tightly closed. Throw away any unused medicine after the expiration date. NOTE: This sheet is a summary. It may not cover all possible information. If you have questions about this medicine, talk to your doctor, pharmacist, or health care provider.  2014, [employer]. (2010-02-21 11:55:44)

## 2013-11-29 NOTE — ED Notes (Signed)
Pelvic cart set up at bedside- pt undressed from waist down.  

## 2014-01-24 ENCOUNTER — Encounter (HOSPITAL_COMMUNITY): Payer: Self-pay | Admitting: Emergency Medicine

## 2014-01-24 ENCOUNTER — Emergency Department (INDEPENDENT_AMBULATORY_CARE_PROVIDER_SITE_OTHER)
Admission: EM | Admit: 2014-01-24 | Discharge: 2014-01-24 | Disposition: A | Discharge status: Home / Self Care | Payer: Self-pay | Source: Home / Self Care

## 2014-01-24 DIAGNOSIS — M65839 Other synovitis and tenosynovitis, unspecified forearm: Secondary | ICD-10-CM

## 2014-01-24 DIAGNOSIS — M778 Other enthesopathies, not elsewhere classified: Secondary | ICD-10-CM

## 2014-01-24 DIAGNOSIS — M65849 Other synovitis and tenosynovitis, unspecified hand: Secondary | ICD-10-CM

## 2014-01-24 DIAGNOSIS — M779 Enthesopathy, unspecified: Principal | ICD-10-CM

## 2014-01-24 NOTE — Discharge Instructions (Signed)
Repetitive Strain Injuries °Repetitive strain injuries (RSIs) result from overuse or misuse of soft tissues including muscles, tendons, or nerves. Tendons are the cord-like structures that attach muscles to bones. RSIs can affect almost any part of the body. However, RSIs are most common in the arms (thumbs, wrists, elbows, shoulders) and legs (ankles, knees). Common medical conditions that are often caused by repetitive strain include carpal tunnel syndrome, tennis or golfer's elbow, bursitis, and tendonitis. If RSIs are treated early, and the repeated activity is reduced or removed, the severity and length of your problems can usually be reduced. RSIs are also called cumulative trauma disorders (CTD).  °CAUSES  °Many RSIs occur due to repeating the same activity at work over weeks or months without sufficient rest, such as prolonged typing. RSIs also commonly occur when a hobby or sport is done repeatedly without sufficient rest. RSIs can also occur due to repeated strain or stress on a body part in someone who has one or more risk factors for RSIs. °RISK FACTORS °Workplace risk factors °· Frequent computer use, especially if your workstation is not adjusted for your body type. °· Infrequent rest breaks. °· Working in a high-pressure environment. °· Working at a fast pace. °· Repeating the same motion, such as frequent typing. °· Working in an awkward position or holding the same position for a long time. °· Forceful movements such as lifting, pulling, or pushing. °· Vibration caused by using power tools. °· Working in cold temperatures. °· Job stress. °Personal risk factors °· Poor posture. °· Being loose-jointed. °· Not exercising regularly. °· Being overweight. °· Arthritis, diabetes, thyroid problems, or other long-term (chronic) medical conditions. °· Vitamin deficiencies. °· Keeping your fingernails long. °· An unhealthy, stressful, or inactive lifestyle. °· Not sleeping well. °SYMPTOMS  °Symptoms often  begin at work but become more noticeable after the repeated stress has ended. For example, you may develop fatigue or soreness in your  wrist while typing at work, and at night you may develop numbness and tingling in your fingers. Common symptoms include:  °· Burning, shooting, or aching pain, especially in the fingers, palms, wrists, forearms, or shoulders. °· Tenderness. °· Swelling. °· Tingling, numbness, or loss of feeling. °· Pain with certain activities, such as turning a doorknob or reaching above your head. °· Weakness, heaviness, or loss of coordination in your hand. °· Muscle spasms or tightness. °In some cases, symptoms can become so intense that it is difficult to perform everyday tasks. Symptoms that do not improve with rest may indicate a more serious condition.  °DIAGNOSIS  °Your caregiver may determine the type of RSI you have based on your medical evaluation and a description of your activities.  °TREATMENT  °Treatment depends on the severity and type of RSI you have. Your caregiver may recommend rest for the affected body part, medicines, and physical or occupational therapy to reduce pain, swelling, and soreness. Discuss the activities you do repeatedly with your caregiver. Your caregiver can help you decide whether you need to change your activities. An RSI may take months or years to heal, especially if the affected body part gets insufficient rest. In some cases, such as severe carpal tunnel syndrome, surgery may be recommended. °PREVENTION °· Talk with your supervisor to make sure you have the proper equipment for your work station. °· Maintain good posture at your desk or work station with: °¨ Feet flat on the floor. °¨ Knees directly over the feet, bent at a right angle. °¨ Lower back supported by your chair or a   cushion in the curve of your lower back. °¨ Shoulders and arms relaxed and at your sides. °¨ Neck relaxed and not bent forwards or backwards. °¨ Your desk and computer workstation  properly adjusted to your body type. °¨ Your chair adjusted so there is no excess pressure on the back of your thighs. °¨ The keyboard resting above your thighs. You should be able to reach the keys with your elbows at your side, bent at a right angle. Your arms should be supported on forearm rests, with your forearms parallel to the ground. °¨ The computer mouse within easy reach. °¨ The monitor directly in front of you, so that your eyes are aligned with the top of the screen. The screen should be about 15 to 25 inches from your eyes. °· While typing, keep your wrist straight, in a neutral position. Move your entire arm when you move your mouse or when typing hard-to-reach keys. °· Only use your computer as much as you need to for work. Do not use it during breaks. °· Take breaks often from any repeated activity. Alternate with another task which requires you to use different muscles, or rest at least once every hour. °· Change positions regularly. If you spend a lot of time sitting, get up, walk around, and stretch. °· Do not hold pens or pencils tightly when writing. °· Exercise regularly. °· Maintain a normal weight. °· Eat a diet with plenty of vegetables, whole grains, and fruit. °· Get sufficient, restful sleep. °HOME CARE INSTRUCTIONS °· If your caregiver prescribed medicine to help reduce swelling, take it as directed. °· Only take over-the-counter or prescription medicines for pain, discomfort, or fever as directed by your caregiver. °· Reduce, and if needed, stop the activities that are causing your problems until you have no further symptoms. If your symptoms are work-related, you may need to talk to your supervisor about changing your activities. °· When symptoms develop, put ice or a cold pack on the aching area. °¨ Put ice in a plastic bag. °¨ Place a towel between your skin and the bag. °¨ Leave the ice on for 15-20 minutes. °· If you were given a splint to keep your wrist from bending, wear it as  instructed. It is important to wear the splint at night. Use the splint for as long as your caregiver recommends. °SEEK MEDICAL CARE IF: °· You develop new problems. °· Your problems do not get better with medicine. °MAKE SURE YOU: °· Understand these instructions. °· Will watch your condition. °· Will get help right away if you are not doing well or get worse. °Document Released: 05/31/2002 Document Revised: 12/10/2011 Document Reviewed: 08/01/2011 °[employer]® Patient Information ©2015 [employer]. This information is not intended to replace advice given to you by your health care provider. Make sure you discuss any questions you have with your health care provider. ° °Tendinitis ° Tendinitis is redness, soreness, and puffiness (inflammation) of the tendons. Tendons are band-like tissues that connect muscle to bone. Tendinitis often happens in the shoulders, heels, or elbows. It might happen if your job involves doing the same motions over and over. °HOME CARE °· Use a sling or splint as told by your doctor. °· Put ice on the injured area. °¨ Put ice in a plastic bag. °¨ Place a towel between your skin and the bag. °¨ Leave the ice on for 15-20 minutes, 03-04 times a day. °· Avoid using your injured arm or leg until the pain goes away. °· Do   gentle exercises only as told by your doctor. Stop exercises if the pain gets worse, unless your doctor tells you otherwise. °· Only take medicines as told by your doctor. °GET HELP RIGHT AWAY IF: °· Your pain and puffiness get worse. °· You have new problems, such as loss of feeling (numbness) in the hands. °MAKE SURE YOU: °· Understand these instructions. °· Will watch your condition. °· Will get help right away if you are not doing well or get worse. °Document Released: 09/20/2010 Document Revised: 09/02/2011 Document Reviewed: 09/20/2010 °[employer]® Patient Information ©2015 [employer]. This information is not intended to replace advice given to you by your health care  provider. Make sure you discuss any questions you have with your health care provider. ° °

## 2014-01-24 NOTE — ED Provider Notes (Signed)
Medical screening examination/treatment/procedure(s) were performed by resident physician or non-physician practitioner and as supervising physician I was immediately available for consultation/collaboration.   Chesley Valls DOUGLAS MD.   Argyle Gustafson D Westley Blass, MD 01/24/14 2027 

## 2014-01-24 NOTE — ED Provider Notes (Signed)
CSN: [id number]     Arrival date & time 01/24/14  1216 History   First MD Initiated Contact with Patient 01/24/14 1442     Chief Complaint  Patient presents with  . 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 No pain. No swelling, No injury.   Past Medical History  Diagnosis Date  . Heart murmur   . Kidney stone    History reviewed. No pertinent past surgical history. History reviewed. No pertinent family history. History  Substance Use Topics  . Smoking status: Never Smoker   . Smokeless tobacco: Not on file  . Alcohol Use: No   OB History   Grav Para Term Preterm Abortions TAB SAB Ect Mult Living                 Review of Systems  Constitutional: Negative.   Musculoskeletal: Negative for back pain, gait problem, joint swelling and myalgias.  Skin: Negative.   Neurological: Positive for numbness. Negative for dizziness, speech difficulty and light-headedness.    Allergies  Acetaminophen  Home Medications   Prior to Admission medications   Not on File   BP 111/71  Pulse 72  Temp(Src) 97.4 F (36.3 C) (Oral)  Resp 16  SpO2 96% Physical Exam  Nursing note and vitals reviewed. Constitutional: She is oriented to person, place, and time. She appears well-developed and well-nourished. No distress.  Neck: Normal range of motion. Neck supple.  Pulmonary/Chest: Effort normal.  Musculoskeletal:  r hand, digits and wrist with full ROM. Difficulty with passive extension of 2-5th digits. Passive is complete. No tenderness or swelling. Nl warmth and color. Sensation is normal. Cap refill brisk. Radial pulse 2+.  Neurological: She is alert and oriented to person, place, and time.  Skin: Skin is warm and dry.  Psychiatric: She has a normal mood and affect.    ED Course  Procedures (including critical care time) Labs Review Labs  Reviewed - No data to display  Imaging Review No results found.   MDM   1. Finger tendinitis     Wrap digits in extension Limit use for 2-3 days.     Hayden Rasmussenavid Almyra Birman, NP 01/24/14 1459

## 2014-01-24 NOTE — ED Notes (Signed)
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

## 2014-01-24 NOTE — ED Notes (Signed)
4  Fingers  Splinted  In pof         With  Gauze and  Ace  Bandage  Reviewed  By  The  physcian  extender

## 2014-06-22 ENCOUNTER — Emergency Department (HOSPITAL_COMMUNITY)
Admission: EM | Admit: 2014-06-22 | Discharge: 2014-06-22 | Disposition: A | Discharge status: Home / Self Care | Payer: No Typology Code available for payment source | Attending: Emergency Medicine | Admitting: Emergency Medicine

## 2014-06-22 ENCOUNTER — Encounter (HOSPITAL_COMMUNITY): Payer: Self-pay

## 2014-06-22 ENCOUNTER — Emergency Department (HOSPITAL_COMMUNITY): Payer: No Typology Code available for payment source

## 2014-06-22 DIAGNOSIS — Y92410 Unspecified street and highway as the place of occurrence of the external cause: Secondary | ICD-10-CM | POA: Diagnosis not present

## 2014-06-22 DIAGNOSIS — S199XXA Unspecified injury of neck, initial encounter: Secondary | ICD-10-CM | POA: Insufficient documentation

## 2014-06-22 DIAGNOSIS — Z87442 Personal history of urinary calculi: Secondary | ICD-10-CM | POA: Insufficient documentation

## 2014-06-22 DIAGNOSIS — S0003XA Contusion of scalp, initial encounter: Secondary | ICD-10-CM | POA: Diagnosis not present

## 2014-06-22 DIAGNOSIS — R51 Headache: Secondary | ICD-10-CM

## 2014-06-22 DIAGNOSIS — Y998 Other external cause status: Secondary | ICD-10-CM | POA: Diagnosis not present

## 2014-06-22 DIAGNOSIS — Z791 Long term (current) use of non-steroidal anti-inflammatories (NSAID): Secondary | ICD-10-CM | POA: Insufficient documentation

## 2014-06-22 DIAGNOSIS — R519 Headache, unspecified: Secondary | ICD-10-CM

## 2014-06-22 DIAGNOSIS — Y9389 Activity, other specified: Secondary | ICD-10-CM | POA: Diagnosis not present

## 2014-06-22 DIAGNOSIS — Z79899 Other long term (current) drug therapy: Secondary | ICD-10-CM | POA: Diagnosis not present

## 2014-06-22 DIAGNOSIS — S0990XA Unspecified injury of head, initial encounter: Secondary | ICD-10-CM | POA: Diagnosis present

## 2014-06-22 DIAGNOSIS — R011 Cardiac murmur, unspecified: Secondary | ICD-10-CM | POA: Diagnosis not present

## 2014-06-22 MED ORDER — NAPROXEN 250 MG PO TABS
500.0000 mg | ORAL_TABLET | Freq: Once | ORAL | Status: AC
  Administered 2014-06-22: 500 mg via ORAL
  Filled 2014-06-22: qty 2

## 2014-06-22 MED ORDER — NAPROXEN 500 MG PO TABS: 500.0000 mg | ORAL_TABLET | Freq: Two times a day (BID) | ORAL | Status: DC

## 2014-06-22 MED ORDER — METHOCARBAMOL 500 MG PO TABS: 500.0000 mg | ORAL_TABLET | Freq: Two times a day (BID) | ORAL | Status: DC
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
## 2014-10-20 ENCOUNTER — Inpatient Hospital Stay (HOSPITAL_COMMUNITY): Payer: Self-pay

## 2014-10-20 ENCOUNTER — Encounter (HOSPITAL_COMMUNITY): Payer: Self-pay

## 2014-10-20 ENCOUNTER — Inpatient Hospital Stay (HOSPITAL_COMMUNITY)
Admission: AD | Admit: 2014-10-20 | Discharge: 2014-10-20 | Disposition: A | Discharge status: Home / Self Care | Payer: Self-pay | Source: Ambulatory Visit | Attending: Obstetrics & Gynecology | Admitting: Obstetrics & Gynecology

## 2014-10-20 DIAGNOSIS — O3680X0 Pregnancy with inconclusive fetal viability, not applicable or unspecified: Secondary | ICD-10-CM

## 2014-10-20 DIAGNOSIS — Z3A01 Less than 8 weeks gestation of pregnancy: Secondary | ICD-10-CM | POA: Insufficient documentation

## 2014-10-20 DIAGNOSIS — O4691 Antepartum hemorrhage, unspecified, first trimester: Secondary | ICD-10-CM

## 2014-10-20 DIAGNOSIS — O209 Hemorrhage in early pregnancy, unspecified: Secondary | ICD-10-CM | POA: Insufficient documentation

## 2014-10-20 LAB — URINALYSIS, ROUTINE W REFLEX MICROSCOPIC
BILIRUBIN URINE: NEGATIVE
Glucose, UA: NEGATIVE mg/dL
Ketones, ur: NEGATIVE mg/dL
Leukocytes, UA: NEGATIVE
Nitrite: NEGATIVE
PH: 6.5 (ref 5.0–8.0)
Protein, ur: NEGATIVE mg/dL
SPECIFIC GRAVITY, URINE: 1.015 (ref 1.005–1.030)
UROBILINOGEN UA: 0.2 mg/dL (ref 0.0–1.0)

## 2014-10-20 LAB — ABO/RH: ABO/RH(D): A POS

## 2014-10-20 LAB — CBC
HCT: 30.9 % — ABNORMAL LOW (ref 36.0–46.0)
Hemoglobin: 10.4 g/dL — ABNORMAL LOW (ref 12.0–15.0)
MCH: 30.2 pg (ref 26.0–34.0)
MCHC: 33.7 g/dL (ref 30.0–36.0)
MCV: 89.8 fL (ref 78.0–100.0)
PLATELETS: 317 10*3/uL (ref 150–400)
RBC: 3.44 MIL/uL — ABNORMAL LOW (ref 3.87–5.11)
RDW: 14.8 % (ref 11.5–15.5)
WBC: 6.5 10*3/uL (ref 4.0–10.5)

## 2014-10-20 LAB — URINE MICROSCOPIC-ADD ON

## 2014-10-20 LAB — WET PREP, GENITAL
Trich, Wet Prep: NONE SEEN
Yeast Wet Prep HPF POC: NONE SEEN

## 2014-10-20 LAB — HCG, QUANTITATIVE, PREGNANCY: hCG, Beta Chain, Quant, S: 169 m[IU]/mL — ABNORMAL HIGH (ref <5)

## 2014-10-20 LAB — POCT PREGNANCY, URINE: PREG TEST UR: POSITIVE — AB

## 2014-10-20 NOTE — MAU Provider Note (Signed)
Chief Complaint: Vaginal Bleeding   First Provider Initiated Contact with Patient 10/20/14 1832      SUBJECTIVE HPI: Cindy [name] is a 19 y.o. G1P0 at 3083w3d by LMP who presents to maternity admissions reporting light spotting 2-3 days after missed menses on 4/23, then positive pregnancy test at home, then more light bleeding starting yesterday. She reports mild cramping associated with bleeding, that is intermittent. She has not taken anything for pain.  Bleeding is intermittent and light.   She denies vaginal itching/burning, urinary symptoms, h/a, dizziness, n/v, or fever/chills.     Vaginal Bleeding The patient's primary symptoms include vaginal bleeding. This is a recurrent problem. The current episode started 1 to 4 weeks ago. The problem occurs intermittently. The problem has been waxing and waning. The pain is mild. The problem affects both sides. She is pregnant. Associated symptoms include abdominal pain. Pertinent negatives include no chills, constipation, dysuria, fever, frequency, headaches, nausea, urgency or vomiting. The vaginal bleeding is lighter than menses. She has not been passing clots. She has not been passing tissue. Nothing aggravates the symptoms. No, her partner does not have an STD.    Past Medical History  Diagnosis Date  . Heart murmur   . Kidney stone    History reviewed. No pertinent past surgical history. History   Social History  . Marital Status: Single    Spouse Name: N/A  . Number of Children: N/A  . Years of Education: N/A   Occupational History  . Not on file.   Social History Main Topics  . Smoking status: Never Smoker   . Smokeless tobacco: Not on file  . Alcohol Use: No  . Drug Use: Yes    Special: Marijuana  . Sexual Activity: Yes    Birth Control/ Protection: None   Other Topics Concern  . Not on file   Social History Narrative   No current facility-administered medications on file prior to encounter.   Current Outpatient  Prescriptions on File Prior to Encounter  Medication Sig Dispense Refill  . ibuprofen (ADVIL,MOTRIN) 200 MG tablet Take 200-400 mg by mouth every 6 (six) hours as needed for moderate pain.    . methocarbamol (ROBAXIN) 500 MG tablet Take 1 tablet (500 mg total) by mouth 2 (two) times daily. (Patient not taking: Reported on 10/20/2014) 20 tablet 0  . naproxen (NAPROSYN) 500 MG tablet Take 1 tablet (500 mg total) by mouth 2 (two) times daily. (Patient not taking: Reported on 10/20/2014) 30 tablet 0   Allergies  Allergen Reactions  . Acetaminophen Swelling    Causes swelling of the face.    Review of Systems  Constitutional: Negative for fever, chills and malaise/fatigue.  Eyes: Negative for blurred vision.  Respiratory: Negative for cough and shortness of breath.   Cardiovascular: Negative for chest pain.  Gastrointestinal: Positive for abdominal pain. Negative for heartburn, nausea, vomiting and constipation.  Genitourinary: Positive for vaginal bleeding. Negative for dysuria, urgency and frequency.  Musculoskeletal: Negative.   Neurological: Negative for dizziness and headaches.  Psychiatric/Behavioral: Negative for depression.    OBJECTIVE Blood pressure 131/65, pulse 64, temperature 99.6 F (37.6 C), resp. rate 18, height 5\' 5"  (1.651 m), weight 50.077 kg (110 lb 6.4 oz), last menstrual period 09/12/2014. GENERAL: Well-developed, well-nourished female in no acute distress.  EYES: normal sclera/conjunctiva; no lid-lag HENT: Atraumatic, normocephalic HEART: normal rate RESP: normal effort ABDOMEN: Soft, non-tender MUSCULOSKELETAL: Normal ROM EXTREMITIES: Nontender, no edema NEURO/PSYCH: Alert and oriented, appropriate affect  PELVIC EXAM: Cervix  pink, visually closed, without lesion, small amount dark red bleeding, vaginal walls and external genitalia normal Bimanual exam: Cervix 0/long/high, firm, anterior, neg CMT, uterus nontender, nonenlarged, adnexa without tenderness,  enlargement, or mass   LAB RESULTS Results for orders placed or performed during the hospital encounter of 10/20/14 (from the past 24 hour(s))  Urinalysis, Routine w reflex microscopic     Status: Abnormal   Collection Time: 10/20/14  6:00 PM  Result Value Ref Range   Color, Urine YELLOW YELLOW   APPearance CLEAR CLEAR   Specific Gravity, Urine 1.015 1.005 - 1.030   pH 6.5 5.0 - 8.0   Glucose, UA NEGATIVE NEGATIVE mg/dL   Hgb urine dipstick LARGE (A) NEGATIVE   Bilirubin Urine NEGATIVE NEGATIVE   Ketones, ur NEGATIVE NEGATIVE mg/dL   Protein, ur NEGATIVE NEGATIVE mg/dL   Urobilinogen, UA 0.2 0.0 - 1.0 mg/dL   Nitrite NEGATIVE NEGATIVE   Leukocytes, UA NEGATIVE NEGATIVE  Urine microscopic-add on     Status: Abnormal   Collection Time: 10/20/14  6:00 PM  Result Value Ref Range   Squamous Epithelial / LPF FEW (A) RARE   WBC, UA 3-6 <3 WBC/hpf   RBC / HPF 7-10 <3 RBC/hpf   Bacteria, UA FEW (A) RARE  Pregnancy, urine POC     Status: Abnormal   Collection Time: 10/20/14  6:17 PM  Result Value Ref Range   Preg Test, Ur POSITIVE (A) NEGATIVE  Wet prep, genital     Status: Abnormal   Collection Time: 10/20/14  6:29 PM  Result Value Ref Range   Yeast Wet Prep HPF POC NONE SEEN NONE SEEN   Trich, Wet Prep NONE SEEN NONE SEEN   Clue Cells Wet Prep HPF POC FEW (A) NONE SEEN   WBC, Wet Prep HPF POC FEW (A) NONE SEEN  CBC     Status: Abnormal   Collection Time: 10/20/14  7:00 PM  Result Value Ref Range   WBC 6.5 4.0 - 10.5 K/uL   RBC 3.44 (L) 3.87 - 5.11 MIL/uL   Hemoglobin 10.4 (L) 12.0 - 15.0 g/dL   HCT 16.1 (L) 09.6 - 04.5 %   MCV 89.8 78.0 - 100.0 fL   MCH 30.2 26.0 - 34.0 pg   MCHC 33.7 30.0 - 36.0 g/dL   RDW 40.9 81.1 - 91.4 %   Platelets 317 150 - 400 K/uL  hCG, quantitative, pregnancy     Status: Abnormal   Collection Time: 10/20/14  7:00 PM  Result Value Ref Range   hCG, Beta Chain, Quant, S 169 (H) <5 mIU/mL  ABO/Rh     Status: None (Preliminary result)   Collection  Time: 10/20/14  7:00 PM  Result Value Ref Range   ABO/RH(D) A POS     IMAGING US Ob Comp Less 14 Wks  10/20/2014   CLINICAL DATA:  19 year old G1, LMP 09/12/2014 (5 weeks 3 days), presenting with vaginal bleeding that began yesterday. Quantitative beta HCG pending.  EXAM: OBSTETRIC <14 WK [place] AND TRANSVAGINAL OB US  TECHNIQUE: Both transabdominal and transvaginal ultrasound examinations were performed for complete evaluation of the gestation as well as the maternal uterus, adnexal regions, and pelvic cul-de-sac. Transvaginal technique was performed to assess early pregnancy.  COMPARISON:  None.  FINDINGS: Intrauterine gestational sac: Not present.  Yolk sac:  Not present.  Embryo:  Not present.  Cardiac Activity: Not applicable.  MSD: Not applicable.  [place] EDC: Not applicable.  Maternal uterus/adnexae: Endometrial thickening up to approximately 14 mm.  Normal-appearing ovaries bilaterally, each containing small follicular cysts. Right ovary measures approximately 3.5 x 1.6 x 2.2 cm. Left ovary measures approximately 2.5 x 1.6 x 1.8 cm. Normal power Doppler flow in both ovaries. Small amount of free fluid in the cul-de-sac.  IMPRESSION: 1. No evidence of intrauterine pregnancy. Please correlate with quantitative beta HCG levels when available. 2. Endometrial thickening up to approximately 14 mm. 3. Normal-appearing ovaries. 4. No adnexal masses.  Small amount of free fluid in the cul-de-sac.   Electronically Signed   By: Hulan Saas M.D.   On: 10/20/2014 19:41   US Ob Transvaginal  10/20/2014   CLINICAL DATA:  19 year old G1, LMP 09/12/2014 (5 weeks 3 days), presenting with vaginal bleeding that began yesterday. Quantitative beta HCG pending.  EXAM: OBSTETRIC <14 WK [place] AND TRANSVAGINAL OB US  TECHNIQUE: Both transabdominal and transvaginal ultrasound examinations were performed for complete evaluation of the gestation as well as the maternal uterus, adnexal regions, and pelvic cul-de-sac. Transvaginal  technique was performed to assess early pregnancy.  COMPARISON:  None.  FINDINGS: Intrauterine gestational sac: Not present.  Yolk sac:  Not present.  Embryo:  Not present.  Cardiac Activity: Not applicable.  MSD: Not applicable.  [place] EDC: Not applicable.  Maternal uterus/adnexae: Endometrial thickening up to approximately 14 mm. Normal-appearing ovaries bilaterally, each containing small follicular cysts. Right ovary measures approximately 3.5 x 1.6 x 2.2 cm. Left ovary measures approximately 2.5 x 1.6 x 1.8 cm. Normal power Doppler flow in both ovaries. Small amount of free fluid in the cul-de-sac.  IMPRESSION: 1. No evidence of intrauterine pregnancy. Please correlate with quantitative beta HCG levels when available. 2. Endometrial thickening up to approximately 14 mm. 3. Normal-appearing ovaries. 4. No adnexal masses.  Small amount of free fluid in the cul-de-sac.   Electronically Signed   By: Hulan Saas M.D.   On: 10/20/2014 19:41    ASSESSMENT 1. Pregnancy of unknown anatomic location   2. Vaginal bleeding in pregnancy, first trimester     PLAN Discharge home with ectopic precautions Return to MAU in 48 hours for repeat quant hcg Return sooner as needed for emergencies    Medication List    STOP taking these medications        ibuprofen 200 MG tablet  Commonly known as:  ADVIL,MOTRIN     methocarbamol 500 MG tablet  Commonly known as:  ROBAXIN     naproxen 500 MG tablet  Commonly known as:  NAPROSYN       Follow-up Information    Follow up with [facility].   Why:  In 48 hours for repeat labs or sooner as needed   Contact information:   [address] [id number] mc [place] [address] [phone]      Sharen Counter Certified Nurse-Midwife 10/20/2014  8:12 PM

## 2014-10-20 NOTE — Discharge Instructions (Signed)
Vaginal Bleeding During Pregnancy, First Trimester  A small amount of bleeding (spotting) from the vagina is relatively common in early pregnancy. It usually stops on its own. Various things may cause bleeding or spotting in early pregnancy. Some bleeding may be related to the pregnancy, and some may not. In most cases, the bleeding is normal and is not a problem. However, bleeding can also be a sign of something serious. Be sure to tell your health care provider about any vaginal bleeding right away.  Some possible causes of vaginal bleeding during the first trimester include:  · Infection or inflammation of the cervix.  · Growths (polyps) on the cervix.  · Miscarriage or threatened miscarriage.  · Pregnancy tissue has developed outside of the uterus and in a fallopian tube (tubal pregnancy).  · Tiny cysts have developed in the uterus instead of pregnancy tissue (molar pregnancy).  HOME CARE INSTRUCTIONS   Watch your condition for any changes. The following actions may help to lessen any discomfort you are feeling:  · Follow your health care provider's instructions for limiting your activity. If your health care provider orders bed rest, you may need to stay in bed and only get up to use the bathroom. However, your health care provider may allow you to continue light activity.  · If needed, make plans for someone to help with your regular activities and responsibilities while you are on bed rest.  · Keep track of the number of pads you use each day, how often you change pads, and how soaked (saturated) they are. Write this down.  · Do not use tampons. Do not douche.  · Do not have sexual intercourse or orgasms until approved by your health care provider.  · If you pass any tissue from your vagina, save the tissue so you can show it to your health care provider.  · Only take over-the-counter or prescription medicines as directed by your health care provider.  · Do not take aspirin because it can make you  bleed.  · Keep all follow-up appointments as directed by your health care provider.  SEEK MEDICAL CARE IF:  · You have any vaginal bleeding during any part of your pregnancy.  · You have cramps or labor pains.  · You have a fever, not controlled by medicine.  SEEK IMMEDIATE MEDICAL CARE IF:   · You have severe cramps in your back or belly (abdomen).  · You pass large clots or tissue from your vagina.  · Your bleeding increases.  · You feel light-headed or weak, or you have fainting episodes.  · You have chills.  · You are leaking fluid or have a gush of fluid from your vagina.  · You pass out while having a bowel movement.  MAKE SURE YOU:  · Understand these instructions.  · Will watch your condition.  · Will get help right away if you are not doing well or get worse.  Document Released: 03/20/2005 Document Revised: 06/15/2013 Document Reviewed: 02/15/2013  ExitCare® Patient Information ©2015 [employer]. This information is not intended to replace advice given to you by your health care provider. Make sure you discuss any questions you have with your health care provider.

## 2014-10-20 NOTE — MAU Note (Signed)
Pt reports she had positive HPT on 10/18/14. Stared bleeding yesterday. C/O abd cramping as well.

## 2014-10-21 LAB — GC/CHLAMYDIA PROBE AMP (~~LOC~~) NOT AT ARMC
Chlamydia: POSITIVE — AB
Neisseria Gonorrhea: NEGATIVE

## 2014-10-21 LAB — HIV ANTIBODY (ROUTINE TESTING W REFLEX): HIV Screen 4th Generation wRfx: NONREACTIVE

## 2014-10-22 ENCOUNTER — Inpatient Hospital Stay (HOSPITAL_COMMUNITY)
Admission: AD | Admit: 2014-10-22 | Discharge: 2014-10-22 | Disposition: A | Discharge status: Home / Self Care | Payer: Self-pay | Source: Ambulatory Visit | Attending: Family Medicine | Admitting: Family Medicine

## 2014-10-22 DIAGNOSIS — O021 Missed abortion: Secondary | ICD-10-CM | POA: Insufficient documentation

## 2014-10-22 DIAGNOSIS — O020 Blighted ovum and nonhydatidiform mole: Secondary | ICD-10-CM

## 2014-10-22 LAB — HCG, QUANTITATIVE, PREGNANCY: hCG, Beta Chain, Quant, S: 84 m[IU]/mL — ABNORMAL HIGH (ref <5)

## 2014-10-22 NOTE — MAU Provider Note (Signed)
Pt here for f/u HCG; pt was seen on 10/20/2014 with spotting and light bleeding and cramping in pregnancy Pt was to f/u later today for HCG but started having heavier bleeding. US on 4/28 showed no IUGS, no evidence of IUP and normal ovaries Pt is bleeding like a heavy period; some cramping - has changed pads about 5 times yesterday- not saturated Pt is alert and oriented in NAD Results for Drucie IpSHIPMAN, Ismahan T (MRN [id number]) as of 10/22/2014 13:07  Ref. Range 10/20/2014 19:00 10/20/2014 19:34 10/22/2014 11:57  HCG, Beta Chain, Quant, S Latest Ref Range: <5 mIU/mL 169 (H)  84 (H)   IMP- anembryonic pregnancy Plan/Disp -f/u 1 week for repeat HCG in MAU Sign and held order for HCG submitted Jean RosenthalSusan P Alfreida Steffenhagen, NP

## 2014-10-22 NOTE — MAU Note (Signed)
Pt presents to MAU with complaints of heavier vaginal bleeding with clots this am. Repeat quant was suppose to be around 5pm but pt was told to come in if her vaginal bleeding got worse.

## 2014-10-24 ENCOUNTER — Telehealth (HOSPITAL_COMMUNITY): Payer: Self-pay | Admitting: *Deleted

## 2014-10-24 DIAGNOSIS — O98811 Other maternal infectious and parasitic diseases complicating pregnancy, first trimester: Principal | ICD-10-CM

## 2014-10-24 DIAGNOSIS — A749 Chlamydial infection, unspecified: Secondary | ICD-10-CM

## 2014-10-24 MED ORDER — AZITHROMYCIN 500 MG PO TABS: ORAL_TABLET | ORAL | Status: DC

## 2014-10-24 NOTE — Telephone Encounter (Signed)
Telephone call to patient regarding positive chlamydia culture, patient notified.  Patient had not been treated and Rx called in per protocol to pharmacy.  Instructed patient to notify her partner for treatment.  Report faxed to health department.

## 2014-10-29 ENCOUNTER — Inpatient Hospital Stay (HOSPITAL_COMMUNITY)
Admission: AD | Admit: 2014-10-29 | Discharge: 2014-10-29 | Disposition: A | Discharge status: Home / Self Care | Payer: Self-pay | Source: Ambulatory Visit | Attending: Obstetrics and Gynecology | Admitting: Obstetrics and Gynecology

## 2014-10-29 DIAGNOSIS — O039 Complete or unspecified spontaneous abortion without complication: Secondary | ICD-10-CM | POA: Insufficient documentation

## 2014-10-29 LAB — HCG, QUANTITATIVE, PREGNANCY: HCG, BETA CHAIN, QUANT, S: 59 m[IU]/mL — AB (ref <5)

## 2014-10-29 NOTE — Discharge Instructions (Signed)
Miscarriage A miscarriage is the sudden loss of an unborn baby (fetus) before the 20th week of pregnancy. Most miscarriages happen in the first 3 months of pregnancy. Sometimes, it happens before a woman even knows she is pregnant. A miscarriage is also called a "spontaneous miscarriage" or "early pregnancy loss." Having a miscarriage can be an emotional experience. Talk with your caregiver about any questions you may have about miscarrying, the grieving process, and your future pregnancy plans. CAUSES   Problems with the fetal chromosomes that make it impossible for the baby to develop normally. Problems with the baby's genes or chromosomes are most often the result of errors that occur, by chance, as the embryo divides and grows. The problems are not inherited from the parents.  Infection of the cervix or uterus.   Hormone problems.   Problems with the cervix, such as having an incompetent cervix. This is when the tissue in the cervix is not strong enough to hold the pregnancy.   Problems with the uterus, such as an abnormally shaped uterus, uterine fibroids, or congenital abnormalities.   Certain medical conditions.   Smoking, drinking alcohol, or taking illegal drugs.   Trauma.  Often, the cause of a miscarriage is unknown.  SYMPTOMS   Vaginal bleeding or spotting, with or without cramps or pain.  Pain or cramping in the abdomen or lower back.  Passing fluid, tissue, or blood clots from the vagina. DIAGNOSIS  Your caregiver will perform a physical exam. You may also have an ultrasound to confirm the miscarriage. Blood or urine tests may also be ordered. TREATMENT   Sometimes, treatment is not necessary if you naturally pass all the fetal tissue that was in the uterus. If some of the fetus or placenta remains in the body (incomplete miscarriage), tissue left behind may become infected and must be removed. Usually, a dilation and curettage (D and C) procedure is performed.  During a D and C procedure, the cervix is widened (dilated) and any remaining fetal or placental tissue is gently removed from the uterus.  Antibiotic medicines are prescribed if there is an infection. Other medicines may be given to reduce the size of the uterus (contract) if there is a lot of bleeding.  If you have Rh negative blood and your baby was Rh positive, you will need a Rh immunoglobulin shot. This shot will protect any future baby from having Rh blood problems in future pregnancies. HOME CARE INSTRUCTIONS   Your caregiver may order bed rest or may allow you to continue light activity. Resume activity as directed by your caregiver.  Have someone help with home and family responsibilities during this time.   Keep track of the number of sanitary pads you use each day and how soaked (saturated) they are. Write down this information.   Do not use tampons. Do not douche or have sexual intercourse until approved by your caregiver.   Only take over-the-counter or prescription medicines for pain or discomfort as directed by your caregiver.   Do not take aspirin. Aspirin can cause bleeding.   Keep all follow-up appointments with your caregiver.   If you or your partner have problems with grieving, talk to your caregiver or seek counseling to help cope with the pregnancy loss. Allow enough time to grieve before trying to get pregnant again.  SEEK IMMEDIATE MEDICAL CARE IF:   You have severe cramps or pain in your back or abdomen.  You have a fever.  You pass large blood clots (walnut-sized   or larger) ortissue from your vagina. Save any tissue for your caregiver to inspect.   Your bleeding increases.   You have a thick, bad-smelling vaginal discharge.  You become lightheaded, weak, or you faint.   You have chills.  MAKE SURE YOU:  Understand these instructions.  Will watch your condition.  Will get help right away if you are not doing well or get  worse. Document Released: 12/04/2000 Document Revised: 10/05/2012 Document Reviewed: 07/30/2011 ExitCare Patient Information 2015 [employer]. This information is not intended to replace advice given to you by your health care provider. Make sure you discuss any questions you have with your health care provider.  

## 2014-10-29 NOTE — MAU Provider Note (Signed)
S: 18 y.o. G1P0 @[redacted]w[redacted]d  by LMP presents to MAU for repeat hcg.  She was diagnosed with SAB at previous visit related to decreasing quant hcg.  She continues to have light spotting, only when wiping and denies pain.  She did pick up prescribed abx for chlamydia and completed treatment.     Her quant hcg on 4/28was 169, then on 4/30 was 84.     O: BP 126/66 mmHg  Pulse 67  Temp(Src) 97.7 F (36.5 C) (Oral)  Resp 16  Ht 5\' 5"  (1.651 m)  Wt 50.066 kg (110 lb 6 oz)  BMI 18.37 kg/m2  LMP 09/12/2014  Physical Examination: General appearance - alert, well appearing, and in no distress, oriented to person, place, and time and acyanotic, in no respiratory distress  Results for orders placed or performed during the hospital encounter of 10/29/14 (from the past 24 hour(s))  hCG, quantitative, pregnancy     Status: Abnormal   Collection Time: 10/29/14  4:38 PM  Result Value Ref Range   hCG, Beta Chain, Quant, S 59 (H) <5 mIU/mL    A: 1. SAB (spontaneous abortion)     P: D/C home with bleeding precautions F/U in [facility] in 1 week, message sent to clinic to make appt Return to MAU as needed for emergencies  LEFTWICH-KIRBY, Remedios Mckone, CNM 2:18 PM

## 2014-10-29 NOTE — MAU Note (Signed)
Pt here for repeat BHCG, denies pain, is still bleeding however only when wiping.

## 2014-11-04 ENCOUNTER — Other Ambulatory Visit: Payer: Medicaid Other

## 2014-11-04 DIAGNOSIS — O039 Complete or unspecified spontaneous abortion without complication: Secondary | ICD-10-CM

## 2014-11-05 LAB — HCG, QUANTITATIVE, PREGNANCY: hCG, Beta Chain, Quant, S: <2 m[IU]/mL

## 2014-11-08 ENCOUNTER — Telehealth: Payer: Self-pay | Admitting: *Deleted

## 2014-11-08 NOTE — Telephone Encounter (Addendum)
-----   Message from Levie HeritageJacob J Stinson, DO sent at 11/05/2014  3:21 PM EDT ----- HCG negative.  No further testing needed.  5/17 0850  Called pt and informed her of result of HCG test on 5/13. I advised that no further testing is necessary. Pt asked if her clinic appt on 5/27 is still needed. I said yes and informed her that the provider will answer any remaining questions that she might have as well as review her birth control options.  Pt agreed and voiced understanding.   Giabella Duhart RNC

## 2014-11-18 ENCOUNTER — Encounter: Payer: Self-pay | Admitting: Medical

## 2014-11-18 ENCOUNTER — Ambulatory Visit (INDEPENDENT_AMBULATORY_CARE_PROVIDER_SITE_OTHER): Payer: Medicaid Other | Admitting: Medical

## 2014-11-18 VITALS — BP 112/61 | HR 62 | Temp 98.2°F | Wt 106.6 lb

## 2014-11-18 DIAGNOSIS — Z30011 Encounter for initial prescription of contraceptive pills: Secondary | ICD-10-CM | POA: Diagnosis present

## 2014-11-18 DIAGNOSIS — R636 Underweight: Secondary | ICD-10-CM | POA: Diagnosis not present

## 2014-11-18 DIAGNOSIS — Z3009 Encounter for other general counseling and advice on contraception: Secondary | ICD-10-CM

## 2014-11-18 MED ORDER — NORGESTIMATE-ETH ESTRADIOL 0.25-35 MG-MCG PO TABS: 1.0000 | ORAL_TABLET | Freq: Every day | ORAL | Status: DC

## 2014-11-18 NOTE — Progress Notes (Signed)
Patient ID: Cindy IpRashawna T Marple, female   DOB: 02-14-1996, 19 y.o.   MRN: [id number]  History:  Cindy [name] is a 19 y.o. G1P0010 who presents to clinic today for follow-up after SAB. Patient had recent lab work showing hCG < 2.0. Patient denies bleeding and pain today. She has not resumed normal periods yet. She has resumed sexual activity and has been using condoms with each encounter. She does not desire another pregnancy at this time and would like to initiate birth control. She has used Nexplanon in the past and did not like it.     Patient Active Problem List   Diagnosis Date Noted  . TINEA VERSICOLOR 08/21/2010  . 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 DYSPNEA ON EXERTION 08/21/2010  . ANEMIA 03/31/2007  . MURMUR 03/31/2007  . CHEST WALL PAIN, HX OF 03/31/2007    Allergies  Allergen Reactions  . Acetaminophen Swelling    Causes swelling of the face.    No current outpatient prescriptions on file prior to visit.   No current facility-administered medications on file prior to visit.     The following portions of the patient's history were reviewed and updated as appropriate: allergies, current medications, family history, past medical history, social history, past surgical history and problem list.  Review of Systems:  Other than those mentioned in HPI all ROS negative  Objective:  Physical Exam BP 112/61 mmHg  Pulse 62  Temp(Src) 98.2 F (36.8 C)  Wt 106 lb 9.6 oz (48.353 kg)  LMP 09/12/2014 CONSTITUTIONAL: Well-developed, well-nourished female in no acute distress.  EYES: EOM intact, conjunctivae normal, no scleral icterus HEAD: Normocephalic, atraumatic ENT: External right and left ear normal, oropharynx is clear and moist. CARDIOVASCULAR: Normal heart rate noted RESPIRATORY: Normal effort MUSCULOSKELETAL: Normal range of motion. No tenderness. SKIN: Skin is warm and dry. No rash noted. Not diaphoretic. No erythema. No pallor. NEUROLGIC: Alert and oriented  to person, place, and time. Normal reflexes, muscle tone, coordination PSYCHIATRIC: Normal mood and affect. Normal behavior. Normal judgment and thought content.  MDM Discussed birth control options. Patient desires OCPs.  Assessment & Plan:  Assessment: SAB, complete Birth control intiation Under weight  Plans: Rx for Sprintec sent to patient's pharmacy Advised of condom use until completed at least the first month of OCPs as back-up  [facility] and [facility] care [facility]  for annual exam or sooner if symptoms were to change or worsen  Marny LowensteinJulie N Minerva Bluett, PA-C 11/18/2014 10:32 AM

## 2014-11-18 NOTE — Progress Notes (Signed)
Patient ID: Cindy [name], female   DOB: January 26, 1996, [age] y.o.   MRN: [id number] Pt denies any bleeding today or pain.

## 2014-11-18 NOTE — Patient Instructions (Signed)

## 2015-06-25 NOTE — L&D Delivery Note (Signed)
Delivery Note  Pt progressed to C/C/+1 without an urge to push. She labored down for a while, than ctx spaced out to q 4-5 minutes.  Pitocin ws started at 2 mu//m,in, and pt labored down for a few2 more hours.  After an hour or so 2nd stage, At 5:35 AM a viable female was delivered via Vaginal, Spontaneous Delivery (Presentation: ;  ).  APGAR: ,9/9 ; weight pedning.  Loose nuchal cord, delivered through.  After 2 minutes, the cord was clamped and cut. 40 units of pitocin diluted in 1000cc LR was infused rapidly IV.  The placenta separated spontaneously and delivered via CCT and maternal pushing effort.  It was inspected and appears to be intact with a 3 VC.    Anesthesia:  epidural Episiotomy: None Lacerations: 1st  degree lperineal Suture Repair: 2.0 vicryl Est. Blood Loss (mL):  200  Mom to postpartum.  Baby to Couplet care / Skin to Skin.  The above by Thalia BloodgoodSam Weinhold, snm under my direct supervision  Cindy [name],Cindy [name] 05/31/2016, 5:39 AM

## 2015-08-25 ENCOUNTER — Encounter (HOSPITAL_COMMUNITY): Payer: Self-pay | Admitting: *Deleted

## 2015-10-10 ENCOUNTER — Encounter: Payer: Self-pay | Admitting: Family Medicine

## 2015-10-10 ENCOUNTER — Ambulatory Visit (INDEPENDENT_AMBULATORY_CARE_PROVIDER_SITE_OTHER): Payer: Self-pay

## 2015-10-10 DIAGNOSIS — Z3201 Encounter for pregnancy test, result positive: Secondary | ICD-10-CM

## 2015-10-10 LAB — POCT URINALYSIS DIP (DEVICE)
Bilirubin Urine: NEGATIVE
Glucose, UA: NEGATIVE mg/dL
Hgb urine dipstick: NEGATIVE
KETONES UR: NEGATIVE mg/dL
Leukocytes, UA: NEGATIVE
Nitrite: NEGATIVE
PH: 7 (ref 5.0–8.0)
Protein, ur: NEGATIVE mg/dL
Specific Gravity, Urine: 1.02 (ref 1.005–1.030)
Urobilinogen, UA: 0.2 mg/dL (ref 0.0–1.0)

## 2015-10-10 LAB — POCT PREGNANCY, URINE: PREG TEST UR: POSITIVE — AB

## 2015-10-10 NOTE — Progress Notes (Signed)
Pt presents today for a pregnancy test which is positive. EDD is 06/04/2016. Pt [facility] with [facility].

## 2015-10-27 ENCOUNTER — Encounter (HOSPITAL_COMMUNITY): Payer: Self-pay | Admitting: *Deleted

## 2015-10-27 ENCOUNTER — Inpatient Hospital Stay (HOSPITAL_COMMUNITY): Payer: Self-pay

## 2015-10-27 ENCOUNTER — Inpatient Hospital Stay (HOSPITAL_COMMUNITY)
Admission: AD | Admit: 2015-10-27 | Discharge: 2015-10-27 | Disposition: A | Discharge status: Home / Self Care | Payer: Self-pay | Source: Ambulatory Visit | Attending: Obstetrics & Gynecology | Admitting: Obstetrics & Gynecology

## 2015-10-27 DIAGNOSIS — B9689 Other specified bacterial agents as the cause of diseases classified elsewhere: Secondary | ICD-10-CM

## 2015-10-27 DIAGNOSIS — O209 Hemorrhage in early pregnancy, unspecified: Secondary | ICD-10-CM | POA: Insufficient documentation

## 2015-10-27 DIAGNOSIS — N760 Acute vaginitis: Secondary | ICD-10-CM | POA: Insufficient documentation

## 2015-10-27 DIAGNOSIS — O26851 Spotting complicating pregnancy, first trimester: Secondary | ICD-10-CM

## 2015-10-27 DIAGNOSIS — O23591 Infection of other part of genital tract in pregnancy, first trimester: Secondary | ICD-10-CM | POA: Insufficient documentation

## 2015-10-27 DIAGNOSIS — Z3A08 8 weeks gestation of pregnancy: Secondary | ICD-10-CM | POA: Insufficient documentation

## 2015-10-27 DIAGNOSIS — Z3491 Encounter for supervision of normal pregnancy, unspecified, first trimester: Secondary | ICD-10-CM

## 2015-10-27 DIAGNOSIS — A499 Bacterial infection, unspecified: Secondary | ICD-10-CM

## 2015-10-27 LAB — CBC WITH DIFFERENTIAL/PLATELET
BASOS PCT: 0 %
Basophils Absolute: 0 10*3/uL (ref 0.0–0.1)
EOS ABS: 0 10*3/uL (ref 0.0–0.7)
Eosinophils Relative: 0 %
HEMATOCRIT: 31.5 % — AB (ref 36.0–46.0)
HEMOGLOBIN: 11 g/dL — AB (ref 12.0–15.0)
Lymphocytes Relative: 36 %
Lymphs Abs: 1.9 10*3/uL (ref 0.7–4.0)
MCH: 31.2 pg (ref 26.0–34.0)
MCHC: 34.9 g/dL (ref 30.0–36.0)
MCV: 89.2 fL (ref 78.0–100.0)
MONO ABS: 0.4 10*3/uL (ref 0.1–1.0)
MONOS PCT: 8 %
NEUTROS PCT: 56 %
Neutro Abs: 3.1 10*3/uL (ref 1.7–7.7)
Platelets: 285 10*3/uL (ref 150–400)
RBC: 3.53 MIL/uL — ABNORMAL LOW (ref 3.87–5.11)
RDW: 14.8 % (ref 11.5–15.5)
WBC: 5.5 10*3/uL (ref 4.0–10.5)

## 2015-10-27 LAB — URINALYSIS, ROUTINE W REFLEX MICROSCOPIC
Bilirubin Urine: NEGATIVE
Glucose, UA: NEGATIVE mg/dL
Hgb urine dipstick: NEGATIVE
KETONES UR: NEGATIVE mg/dL
LEUKOCYTES UA: NEGATIVE
NITRITE: NEGATIVE
PROTEIN: NEGATIVE mg/dL
Specific Gravity, Urine: <1.005 — ABNORMAL LOW (ref 1.005–1.030)
pH: 5.5 (ref 5.0–8.0)

## 2015-10-27 LAB — RAPID HIV SCREEN (HIV 1/2 AB+AG)
HIV 1/2 Antibodies: NONREACTIVE
HIV-1 P24 ANTIGEN - HIV24: NONREACTIVE

## 2015-10-27 LAB — WET PREP, GENITAL
Sperm: NONE SEEN
Trich, Wet Prep: NONE SEEN
YEAST WET PREP: NONE SEEN

## 2015-10-27 MED ORDER — METRONIDAZOLE 500 MG PO TABS: 500.0000 mg | ORAL_TABLET | Freq: Two times a day (BID) | ORAL | Status: DC

## 2015-10-27 NOTE — Discharge Instructions (Signed)
Bacterial Vaginosis Bacterial vaginosis is a vaginal infection that occurs when the normal balance of bacteria in the vagina is disrupted. It results from an overgrowth of certain bacteria. This is the most common vaginal infection in women of childbearing age. Treatment is important to prevent complications, especially in pregnant women, as it can cause a premature delivery. CAUSES  Bacterial vaginosis is caused by an increase in harmful bacteria that are normally present in smaller amounts in the vagina. Several different kinds of bacteria can cause bacterial vaginosis. However, the reason that the condition develops is not fully understood. RISK FACTORS Certain activities or behaviors can put you at an increased risk of developing bacterial vaginosis, including:  Having a new sex partner or multiple sex partners.  Douching.  Using an intrauterine device (IUD) for contraception. Women do not get bacterial vaginosis from toilet seats, bedding, swimming pools, or contact with objects around them. SIGNS AND SYMPTOMS  Some women with bacterial vaginosis have no signs or symptoms. Common symptoms include:  Grey vaginal discharge.  A fishlike odor with discharge, especially after sexual intercourse.  Itching or burning of the vagina and vulva.  Burning or pain with urination. DIAGNOSIS  Your health care provider will take a medical history and examine the vagina for signs of bacterial vaginosis. A sample of vaginal fluid may be taken. Your health care provider will look at this sample under a microscope to check for bacteria and abnormal cells. A vaginal pH test may also be done.  TREATMENT  Bacterial vaginosis may be treated with antibiotic medicines. These may be given in the form of a pill or a vaginal cream. A second round of antibiotics may be prescribed if the condition comes back after treatment. Because bacterial vaginosis increases your risk for sexually transmitted diseases, getting  treated can help reduce your risk for chlamydia, gonorrhea, HIV, and herpes. HOME CARE INSTRUCTIONS   Only take over-the-counter or prescription medicines as directed by your health care provider.  If antibiotic medicine was prescribed, take it as directed. Make sure you finish it even if you start to feel better.  Tell all sexual partners that you have a vaginal infection. They should see their health care provider and be treated if they have problems, such as a mild rash or itching.  During treatment, it is important that you follow these instructions:  Avoid sexual activity or use condoms correctly.  Do not douche.  Avoid alcohol as directed by your health care provider.  Avoid breastfeeding as directed by your health care provider. SEEK MEDICAL CARE IF:   Your symptoms are not improving after 3 days of treatment.  You have increased discharge or pain.  You have a fever. MAKE SURE YOU:   Understand these instructions.  Will watch your condition.  Will get help right away if you are not doing well or get worse. FOR MORE INFORMATION  Centers for Disease Control and Prevention, Division of STD Prevention: [employer].za American Sexual Health Association (ASHA): www.ashastd.org    This information is not intended to replace advice given to you by your health care provider. Make sure you discuss any questions you have with your health care provider.   Document Released: 06/10/2005 Document Revised: 07/01/2014 Document Reviewed: 01/20/2013 Elsevier Interactive Patient Education 2016 Elsevier Inc. Vaginal Bleeding During Pregnancy, First Trimester A small amount of bleeding (spotting) from the vagina is common in early pregnancy. Sometimes the bleeding is normal and is not a problem, and sometimes it is a sign of  something serious. Be sure to tell your doctor about any bleeding from your vagina right away. HOME CARE  Watch your condition for any changes.  Follow your  doctor's instructions about how active you can be.  If you are on bed rest:  You may need to stay in bed and only get up to use the bathroom.  You may be allowed to do some activities.  If you need help, make plans for someone to help you.  Write down:  The number of pads you use each day.  How often you change pads.  How soaked (saturated) your pads are.  Do not use tampons.  Do not douche.  Do not have sex or orgasms until your doctor says it is okay.  If you pass any tissue from your vagina, save the tissue so you can show it to your doctor.  Only take medicines as told by your doctor.  Do not take aspirin because it can make you bleed.  Keep all follow-up visits as told by your doctor. GET HELP IF:   You bleed from your vagina.  You have cramps.  You have labor pains.  You have a fever that does not go away after you take medicine. GET HELP RIGHT AWAY IF:   You have very bad cramps in your back or belly (abdomen).  You pass large clots or tissue from your vagina.  You bleed more.  You feel light-headed or weak.  You pass out (faint).  You have chills.  You are leaking fluid or have a gush of fluid from your vagina.  You pass out while pooping (having a bowel movement). MAKE SURE YOU:  Understand these instructions.  Will watch your condition.  Will get help right away if you are not doing well or get worse.   This information is not intended to replace advice given to you by your health care provider. Make sure you discuss any questions you have with your health care provider.   Document Released: 10/25/2013 Document Reviewed: 10/25/2013 Elsevier Interactive Patient Education [employer]2016 Elsevier Inc.

## 2015-10-27 NOTE — MAU Provider Note (Signed)
History     CSN: [id number]  Arrival date and time: 10/27/15 1027   None     Chief Complaint  Patient presents with  . Vaginal Bleeding  . Abdominal Pain   HPI Cindy [name] is 20 y.o. G2P0010 4381w2d weeks presenting with spotting that began yesterday.  Light pink in color after shower.  None seen today.  Hx of SAB last year.  Also having left abdomen pain.  Yesterday she had right sided abdominal pain.  Rated pain at its worst 10.10 after eating greasy, currently rates as 5/10. Movement makes it worse. Has not taken anything for pain.  N&V occasionally.  1 partner.     Past Medical History  Diagnosis Date  . Heart murmur   . Kidney stone     Past Surgical History  Procedure Laterality Date  . No past surgeries      History reviewed. No pertinent family history.  Social History  Substance Use Topics  . Smoking status: Never Smoker   . Smokeless tobacco: None  . Alcohol Use: No     Comment: Denies current drup use 10/27/2015    Allergies:  Allergies  Allergen Reactions  . Acetaminophen Swelling    Causes swelling of the face.    Prescriptions prior to admission  Medication Sig Dispense Refill Last Dose  . Prenatal MV-Min-FA-Omega-3 (PRENATAL GUMMIES/DHA & FA PO) Take 2 each by mouth daily.   10/26/2015 at Unknown time    Review of Systems  Constitutional: Negative for fever, chills and malaise/fatigue.  Gastrointestinal: Positive for nausea, vomiting and abdominal pain (lower left abdominal pain.).  Genitourinary: Negative for dysuria, urgency, frequency, hematuria and flank pain.       Spotting yesterday.  Neurological: Negative for headaches.   Physical Exam   Blood pressure 103/59, pulse 76, temperature 98.4 F (36.9 C), resp. rate 16, height 5' 4.5" (1.638 m), weight 100 lb (45.36 kg), last menstrual period 08/30/2015, SpO2 100 %, unknown if currently breastfeeding.  Physical Exam  Nursing note and vitals reviewed. Constitutional: She is oriented to  person, place, and time. She appears well-developed and well-nourished. No distress.  HENT:  Head: Normocephalic.  Neck: Normal range of motion.  Cardiovascular: Normal rate.   Respiratory: Effort normal.  GI: Soft. She exhibits no distension and no mass. There is tenderness (lower left quadrants). There is no rebound and no guarding.  Genitourinary: There is no rash, tenderness or lesion on the right labia. There is no rash, tenderness or lesion on the left labia. Uterus is enlarged. Uterus is not tender. Cervix exhibits no motion tenderness, no discharge and no friability. Right adnexum displays tenderness (lower, mild tenderness on exam) and fullness. Right adnexum displays no mass. Left adnexum displays no mass, no tenderness and no fullness. No erythema, tenderness or bleeding (neg for blood, clots) in the vagina. Vaginal discharge (moderate amount of frothy, malodorous discharge.  ) found.  Neurological: She is alert and oriented to person, place, and time.  Skin: Skin is warm and dry.  Psychiatric: She has a normal mood and affect. Her behavior is normal. Thought content normal.    Results for orders placed or performed during the hospital encounter of 10/27/15 (from the past 24 hour(s))  Urinalysis, Routine w reflex microscopic (not [facility])     Status: Abnormal   Collection Time: 10/27/15 10:35 AM  Result Value Ref Range   Color, Urine YELLOW YELLOW   APPearance CLEAR CLEAR   Specific Gravity, Urine <1.005 (L)  1.005 - 1.030   pH 5.5 5.0 - 8.0   Glucose, UA NEGATIVE NEGATIVE mg/dL   Hgb urine dipstick NEGATIVE NEGATIVE   Bilirubin Urine NEGATIVE NEGATIVE   Ketones, ur NEGATIVE NEGATIVE mg/dL   Protein, ur NEGATIVE NEGATIVE mg/dL   Nitrite NEGATIVE NEGATIVE   Leukocytes, UA NEGATIVE NEGATIVE  Wet prep, genital     Status: Abnormal   Collection Time: 10/27/15 11:13 AM  Result Value Ref Range   Yeast Wet Prep HPF POC NONE SEEN NONE SEEN   Trich, Wet Prep NONE SEEN NONE SEEN    Clue Cells Wet Prep HPF POC PRESENT (A) NONE SEEN   WBC, Wet Prep HPF POC MANY (A) NONE SEEN   Sperm NONE SEEN   CBC with Differential/Platelet     Status: Abnormal   Collection Time: 10/27/15 11:27 AM  Result Value Ref Range   WBC 5.5 4.0 - 10.5 K/uL   RBC 3.53 (L) 3.87 - 5.11 MIL/uL   Hemoglobin 11.0 (L) 12.0 - 15.0 g/dL   HCT 45.4 (L) 09.8 - 11.9 %   MCV 89.2 78.0 - 100.0 fL   MCH 31.2 26.0 - 34.0 pg   MCHC 34.9 30.0 - 36.0 g/dL   RDW 14.7 82.9 - 56.2 %   Platelets 285 150 - 400 K/uL   Neutrophils Relative % 56 %   Neutro Abs 3.1 1.7 - 7.7 K/uL   Lymphocytes Relative 36 %   Lymphs Abs 1.9 0.7 - 4.0 K/uL   Monocytes Relative 8 %   Monocytes Absolute 0.4 0.1 - 1.0 K/uL   Eosinophils Relative 0 %   Eosinophils Absolute 0.0 0.0 - 0.7 K/uL   Basophils Relative 0 %   Basophils Absolute 0.0 0.0 - 0.1 K/uL  Rapid HIV screen (HIV 1/2 Ab+Ag) ([facility] Only)     Status: None   Collection Time: 10/27/15 11:27 AM  Result Value Ref Range   HIV-1 P24 Antigen - HIV24 NON REACTIVE NON REACTIVE   HIV 1/2 Antibodies NON REACTIVE NON REACTIVE   Interpretation (HIV Ag Ab)      A non reactive test result means that HIV 1 or HIV 2 antibodies and HIV 1 p24 antigen were not detected in the specimen.   BLOOD TYPE from previous record A positive  US Ob Comp Less 14 Wks  10/27/2015  CLINICAL DATA:  20 year old pregnant female presenting with abdominal pain and spotting for 1 day. EDC by LMP: 06/05/2016, projecting to an expected gestational age of [redacted] weeks 2 days. EXAM: OBSTETRIC <14 WK [place] AND TRANSVAGINAL OB US TECHNIQUE: Both transabdominal and transvaginal ultrasound examinations were performed for complete evaluation of the gestation as well as the maternal uterus, adnexal regions, and pelvic cul-de-sac. Transvaginal technique was performed to assess early pregnancy. COMPARISON:  No prior scans from this gestation. FINDINGS: Intrauterine gestational sac: Single intrauterine gestational sac appears normal in  size, shape and position. Yolk sac:  Present. Fetus: Present. Fetal anatomy not assessed at this early gestational age. Fetal Cardiac Activity: Regular rate and rhythm. Fetal Heart Rate: 166  bpm CRL:  17.6  mm   8 w   2 d                  [place] EDC: 06/05/2016 Subchorionic hemorrhage:  None visualized. Maternal uterus/adnexae: Left ovary measures 3.6 x 2.3 x 3.4 cm and contains a 1.7 cm corpus luteum. Right ovary measures 3.8 x 1.5 x 1.7 cm. No suspicious ovarian or adnexal masses. No abnormal free  fluid in the pelvis. No uterine fibroids. IMPRESSION: 1. Single living intrauterine gestation at 8 weeks 2 days by crown-rump length, concordant with provided menstrual dating. 2. No first-trimester gestational abnormality. 3. No ovarian or adnexal abnormality. Electronically Signed   By: Delbert Phenix M.D.   On: 10/27/2015 12:18   US Ob Transvaginal  10/27/2015  CLINICAL DATA:  20 year old pregnant female presenting with abdominal pain and spotting for 1 day. EDC by LMP: 06/05/2016, projecting to an expected gestational age of [redacted] weeks 2 days. EXAM: OBSTETRIC <14 WK [place] AND TRANSVAGINAL OB US TECHNIQUE: Both transabdominal and transvaginal ultrasound examinations were performed for complete evaluation of the gestation as well as the maternal uterus, adnexal regions, and pelvic cul-de-sac. Transvaginal technique was performed to assess early pregnancy. COMPARISON:  No prior scans from this gestation. FINDINGS: Intrauterine gestational sac: Single intrauterine gestational sac appears normal in size, shape and position. Yolk sac:  Present. Fetus: Present. Fetal anatomy not assessed at this early gestational age. Fetal Cardiac Activity: Regular rate and rhythm. Fetal Heart Rate: 166  bpm CRL:  17.6  mm   8 w   2 d                  [place] EDC: 06/05/2016 Subchorionic hemorrhage:  None visualized. Maternal uterus/adnexae: Left ovary measures 3.6 x 2.3 x 3.4 cm and contains a 1.7 cm corpus luteum. Right ovary measures 3.8 x 1.5 x 1.7  cm. No suspicious ovarian or adnexal masses. No abnormal free fluid in the pelvis. No uterine fibroids. IMPRESSION: 1. Single living intrauterine gestation at 8 weeks 2 days by crown-rump length, concordant with provided menstrual dating. 2. No first-trimester gestational abnormality. 3. No ovarian or adnexal abnormality. Electronically Signed   By: Delbert Phenix M.D.   On: 10/27/2015 12:18    MAU Course  Procedures  GC/CHL culture pending.  MDM MSE Exam Lab U/S  Assessment and Plan  A:  Vaginal bleeding in early pregnancy       Lower left abdominal pain in early pregnancy       History of SAB       U/S- Viable IUP [redacted]w[redacted]d gestation       Bacterial vaginosis  P:  Begin prenatal care with MD of choice; begin OTC prenatal vitamins 1 qd      Rx for flagyl to pharmacy      Pelvic rest while on antibiotic and until no further bleeding noted      Return for worsening sxs.       KEY,EVE M 10/27/2015, 12:19 PM

## 2015-10-27 NOTE — MAU Note (Signed)
Pt reports spotting since yesterday. She reports pain in her L abdomen. Yesterday, pain was in her R abdomen.

## 2015-10-30 LAB — GC/CHLAMYDIA PROBE AMP (~~LOC~~) NOT AT ARMC
CHLAMYDIA, DNA PROBE: NEGATIVE
NEISSERIA GONORRHEA: NEGATIVE

## 2015-11-09 LAB — OB RESULTS CONSOLE HIV ANTIBODY (ROUTINE TESTING): HIV: NONREACTIVE

## 2015-11-09 LAB — OB RESULTS CONSOLE GC/CHLAMYDIA: Gonorrhea: NEGATIVE

## 2015-11-09 LAB — OB RESULTS CONSOLE RUBELLA ANTIBODY, IGM: RUBELLA: IMMUNE

## 2015-11-09 LAB — OB RESULTS CONSOLE HEPATITIS B SURFACE ANTIGEN: HEP B S AG: NEGATIVE

## 2015-11-09 LAB — OB RESULTS CONSOLE RPR: RPR: NONREACTIVE

## 2015-11-22 ENCOUNTER — Encounter (HOSPITAL_COMMUNITY): Payer: Self-pay | Admitting: Physician Assistant

## 2015-11-28 ENCOUNTER — Encounter (HOSPITAL_COMMUNITY): Payer: Self-pay

## 2015-11-28 ENCOUNTER — Other Ambulatory Visit (HOSPITAL_COMMUNITY): Payer: Self-pay | Admitting: Physician Assistant

## 2015-11-28 DIAGNOSIS — Z369 Encounter for antenatal screening, unspecified: Secondary | ICD-10-CM

## 2015-11-29 ENCOUNTER — Other Ambulatory Visit (HOSPITAL_COMMUNITY): Payer: Self-pay | Admitting: Physician Assistant

## 2015-11-29 ENCOUNTER — Ambulatory Visit (HOSPITAL_COMMUNITY)
Admission: RE | Admit: 2015-11-29 | Discharge: 2015-11-29 | Disposition: A | Discharge status: Home / Self Care | Payer: Medicaid Other | Source: Ambulatory Visit | Attending: Physician Assistant | Admitting: Physician Assistant

## 2015-11-29 ENCOUNTER — Encounter (HOSPITAL_COMMUNITY): Payer: Self-pay

## 2015-11-29 DIAGNOSIS — Z36 Encounter for antenatal screening of mother: Secondary | ICD-10-CM | POA: Diagnosis not present

## 2015-11-29 DIAGNOSIS — Z369 Encounter for antenatal screening, unspecified: Secondary | ICD-10-CM

## 2015-11-29 DIAGNOSIS — Z3A13 13 weeks gestation of pregnancy: Secondary | ICD-10-CM | POA: Diagnosis not present

## 2016-05-09 LAB — OB RESULTS CONSOLE GBS
GBS: POSITIVE
STREP GROUP B AG: POSITIVE

## 2016-05-30 ENCOUNTER — Inpatient Hospital Stay (HOSPITAL_COMMUNITY)
Admission: AD | Admit: 2016-05-30 | Discharge: 2016-06-02 | DRG: 775 | Disposition: A | Discharge status: Home / Self Care | Payer: Medicaid Other | Source: Ambulatory Visit | Attending: Obstetrics and Gynecology | Admitting: Obstetrics and Gynecology

## 2016-05-30 ENCOUNTER — Encounter (HOSPITAL_COMMUNITY): Payer: Self-pay | Admitting: *Deleted

## 2016-05-30 ENCOUNTER — Inpatient Hospital Stay (HOSPITAL_COMMUNITY): Payer: Medicaid Other | Admitting: Anesthesiology

## 2016-05-30 DIAGNOSIS — O4292 Full-term premature rupture of membranes, unspecified as to length of time between rupture and onset of labor: Secondary | ICD-10-CM | POA: Diagnosis present

## 2016-05-30 DIAGNOSIS — O99824 Streptococcus B carrier state complicating childbirth: Secondary | ICD-10-CM | POA: Diagnosis present

## 2016-05-30 DIAGNOSIS — Z3A39 39 weeks gestation of pregnancy: Secondary | ICD-10-CM | POA: Diagnosis not present

## 2016-05-30 LAB — COMPREHENSIVE METABOLIC PANEL
ALBUMIN: 3.2 g/dL — AB (ref 3.5–5.0)
ALK PHOS: 159 U/L — AB (ref 38–126)
ALT: 23 U/L (ref 14–54)
ANION GAP: 7 (ref 5–15)
AST: 32 U/L (ref 15–41)
BILIRUBIN TOTAL: 0.3 mg/dL (ref 0.3–1.2)
BUN: 12 mg/dL (ref 6–20)
CALCIUM: 9.1 mg/dL (ref 8.9–10.3)
CO2: 22 mmol/L (ref 22–32)
Chloride: 106 mmol/L (ref 101–111)
Creatinine, Ser: 0.58 mg/dL (ref 0.44–1.00)
GFR calc Af Amer: >60 mL/min (ref >60)
GLUCOSE: 88 mg/dL (ref 65–99)
Potassium: 4.4 mmol/L (ref 3.5–5.1)
Sodium: 135 mmol/L (ref 135–145)
TOTAL PROTEIN: 7 g/dL (ref 6.5–8.1)

## 2016-05-30 LAB — TYPE AND SCREEN
ABO/RH(D): A POS
ANTIBODY SCREEN: NEGATIVE

## 2016-05-30 LAB — CBC
HCT: 31.5 % — ABNORMAL LOW (ref 36.0–46.0)
Hemoglobin: 10.6 g/dL — ABNORMAL LOW (ref 12.0–15.0)
MCH: 30.2 pg (ref 26.0–34.0)
MCHC: 33.7 g/dL (ref 30.0–36.0)
MCV: 89.7 fL (ref 78.0–100.0)
Platelets: 294 10*3/uL (ref 150–400)
RBC: 3.51 MIL/uL — ABNORMAL LOW (ref 3.87–5.11)
RDW: 14.5 % (ref 11.5–15.5)
WBC: 10 10*3/uL (ref 4.0–10.5)

## 2016-05-30 LAB — PROTEIN / CREATININE RATIO, URINE
Creatinine, Urine: 59 mg/dL
PROTEIN CREATININE RATIO: 0.2 mg/mg{creat} — AB (ref 0.00–0.15)
Total Protein, Urine: 12 mg/dL

## 2016-05-30 LAB — POCT FERN TEST: POCT Fern Test: POSITIVE

## 2016-05-30 MED ORDER — LIDOCAINE HCL (PF) 1 % IJ SOLN
30.0000 mL | INTRAMUSCULAR | Status: DC | PRN
  Administered 2016-05-31: 30 mL via SUBCUTANEOUS
  Filled 2016-05-30: qty 30

## 2016-05-30 MED ORDER — EPHEDRINE 5 MG/ML INJ
10.0000 mg | INTRAVENOUS | Status: DC | PRN
  Filled 2016-05-30: qty 4

## 2016-05-30 MED ORDER — LACTATED RINGERS IV SOLN
500.0000 mL | Freq: Once | INTRAVENOUS | Status: AC
  Administered 2016-05-30: 500 mL via INTRAVENOUS

## 2016-05-30 MED ORDER — LIDOCAINE HCL (PF) 1 % IJ SOLN
INTRAMUSCULAR | Status: DC | PRN
  Administered 2016-05-30: 4 mL
  Administered 2016-05-30: 6 mL via EPIDURAL

## 2016-05-30 MED ORDER — LACTATED RINGERS IV SOLN: 500.0000 mL | INTRAVENOUS | Status: DC | PRN

## 2016-05-30 MED ORDER — FENTANYL CITRATE (PF) 100 MCG/2ML IJ SOLN: 50.0000 ug | INTRAMUSCULAR | Status: DC | PRN

## 2016-05-30 MED ORDER — OXYTOCIN BOLUS FROM INFUSION
500.0000 mL | Freq: Once | INTRAVENOUS | Status: AC
  Administered 2016-05-31: 500 mL via INTRAVENOUS

## 2016-05-30 MED ORDER — PHENYLEPHRINE 40 MCG/ML (10ML) SYRINGE FOR IV PUSH (FOR BLOOD PRESSURE SUPPORT)
80.0000 ug | PREFILLED_SYRINGE | INTRAVENOUS | Status: DC | PRN
  Filled 2016-05-30: qty 5
  Filled 2016-05-30: qty 10

## 2016-05-30 MED ORDER — FLEET ENEMA 7-19 GM/118ML RE ENEM
1.0000 | ENEMA | RECTAL | Status: DC | PRN
Start: 2016-05-30 — End: 2016-05-31

## 2016-05-30 MED ORDER — PENICILLIN G POT IN DEXTROSE 60000 UNIT/ML IV SOLN
3.0000 10*6.[IU] | INTRAVENOUS | Status: DC
  Administered 2016-05-30 – 2016-05-31 (×4): 3 10*6.[IU] via INTRAVENOUS
  Filled 2016-05-30 (×9): qty 50

## 2016-05-30 MED ORDER — PENICILLIN G POTASSIUM 5000000 UNITS IJ SOLR
5.0000 10*6.[IU] | Freq: Once | INTRAMUSCULAR | Status: AC
  Administered 2016-05-30: 5 10*6.[IU] via INTRAVENOUS
  Filled 2016-05-30: qty 5

## 2016-05-30 MED ORDER — OXYTOCIN 40 UNITS IN LACTATED RINGERS INFUSION - SIMPLE MED
2.5000 [IU]/h | INTRAVENOUS | Status: DC
  Administered 2016-05-31: 2.5 [IU]/h via INTRAVENOUS
  Filled 2016-05-30: qty 1000

## 2016-05-30 MED ORDER — FENTANYL 2.5 MCG/ML BUPIVACAINE 1/10 % EPIDURAL INFUSION (WH - ANES)
14.0000 mL/h | INTRAMUSCULAR | Status: DC | PRN
  Administered 2016-05-30 – 2016-05-31 (×3): 14 mL/h via EPIDURAL
  Filled 2016-05-30 (×3): qty 100

## 2016-05-30 MED ORDER — SOD CITRATE-CITRIC ACID 500-334 MG/5ML PO SOLN: 30.0000 mL | ORAL | Status: DC | PRN

## 2016-05-30 MED ORDER — ONDANSETRON HCL 4 MG/2ML IJ SOLN
4.0000 mg | Freq: Four times a day (QID) | INTRAMUSCULAR | Status: DC | PRN
  Administered 2016-05-30: 4 mg via INTRAVENOUS
  Filled 2016-05-30: qty 2

## 2016-05-30 MED ORDER — LACTATED RINGERS IV SOLN
INTRAVENOUS | Status: DC
  Administered 2016-05-30 (×3): via INTRAVENOUS

## 2016-05-30 MED ORDER — PHENYLEPHRINE 40 MCG/ML (10ML) SYRINGE FOR IV PUSH (FOR BLOOD PRESSURE SUPPORT)
80.0000 ug | PREFILLED_SYRINGE | INTRAVENOUS | Status: DC | PRN
  Filled 2016-05-30: qty 5

## 2016-05-30 MED ORDER — DIPHENHYDRAMINE HCL 50 MG/ML IJ SOLN
12.5000 mg | INTRAMUSCULAR | Status: DC | PRN
Start: 2016-05-30 — End: 2016-05-31

## 2016-05-30 NOTE — Anesthesia Procedure Notes (Signed)

## 2016-05-30 NOTE — Anesthesia Pain Management Evaluation Note (Signed)
  CRNA Pain Management Visit Note  Patient: Cindy [name], 20 y.o., female  "Hello I am a member of the anesthesia team at [facility]. We have an anesthesia team available at all times to provide care throughout the hospital, including epidural management and anesthesia for C-section. I don't know your plan for the delivery whether it a natural birth, water birth, IV sedation, nitrous supplementation, doula or epidural, but we want to meet your pain goals."   1.Was your pain managed to your expectations on prior hospitalizations?   No prior hospitalizations  2.What is your expectation for pain management during this hospitalization?     Epidural  3.How can we help you reach that goal? epidural  Record the patient's initial score and the patient's pain goal.   Pain: 7  Pain Goal: 8 [facility] wants you to be able to say your pain was always managed very well.  Cindy [name] 05/30/2016

## 2016-05-30 NOTE — H&P (Signed)
LABOR AND DELIVERY ADMISSION HISTORY AND PHYSICAL NOTE  Cindy [name] is a 20 y.o. female G2P0010 with IUP at 147w1d by LMP presenting for SROM/SOL.   She reports positive fetal movement. She denies leakage of fluid or vaginal bleeding.  Prenatal History/Complications:  Past Medical History: Past Medical History:  Diagnosis Date  . Heart murmur   . Kidney stone     Past Surgical History: Past Surgical History:  Procedure Laterality Date  . NO PAST SURGERIES      Obstetrical History: OB History    Gravida Para Term Preterm AB Living   2 0 0 0 1 0   SAB TAB Ectopic Multiple Live Births   1 0 0 0 0      Social History: Social History   Social History  . Marital status: Single    Spouse name: N/A  . Number of children: N/A  . Years of education: N/A   Social History Main Topics  . Smoking status: Never Smoker  . Smokeless tobacco: Never Used  . Alcohol use No     Comment: Denies current drup use 09/27/2015  . Drug use: No  . Sexual activity: Yes    Birth control/ protection: None   Other Topics Concern  . None   Social History Narrative  . None    Family History: History reviewed. No pertinent family history.  Allergies: Allergies  Allergen Reactions  . Acetaminophen Swelling    Causes swelling of the face.    Prescriptions Prior to Admission  Medication Sig Dispense Refill Last Dose  . Prenatal Vit-Fe Fumarate-FA (PRENATAL MULTIVITAMIN) TABS tablet Take 1 tablet by mouth daily at 12 noon.   Past Week at Unknown time     Review of Systems   All systems reviewed and negative except as stated in HPI  Blood pressure 129/88, pulse 84, temperature 98.5 F (36.9 C), temperature source Axillary, resp. rate 18, height 5\' 4"  (1.626 m), weight 156 lb (70.8 kg), last menstrual period 08/30/2015, unknown if currently breastfeeding. General appearance: alert, cooperative and appears stated age Lungs: clear to auscultation bilaterally Heart: regular rate  and rhythm Abdomen: soft, non-tender; bowel sounds normal Extremities: No calf swelling or tenderness Presentation: cephalic Fetal monitoring: Cat I tracing Uterine activity: absent Dilation: 3 Effacement (%): 80 Station: -2 Exam by:: Earlene PlaterWallace MD   Prenatal labs: ABO, Rh: --/--/A POS (12/07 16100917) Antibody: NEG (12/07 0917) Rubella: !Error!immune RPR:   negative HBsAg:   negative HIV:   Nonreactive GBS: Positive (11/16 0000)  1 hr Glucola: 135 3hr 85/113 Genetic screening:  Quad neg Anatomy US: normal  Prenatal Transfer Tool  Maternal Diabetes: No Genetic Screening: Normal Maternal Ultrasounds/Referrals: Normal Fetal Ultrasounds or other Referrals:  None Maternal Substance Abuse:  Yes:  Type: Marijuana Significant Maternal Medications:  None Significant Maternal Lab Results: None  Results for orders placed or performed during the hospital encounter of 05/30/16 (from the past 24 hour(s))  Fern Test   Collection Time: 05/30/16  8:53 AM  Result Value Ref Range   POCT Fern Test Positive = ruptured amniotic membanes   CBC   Collection Time: 05/30/16  9:17 AM  Result Value Ref Range   WBC 10.0 4.0 - 10.5 K/uL   RBC 3.51 (L) 3.87 - 5.11 MIL/uL   Hemoglobin 10.6 (L) 12.0 - 15.0 g/dL   HCT 96.031.5 (L) 45.436.0 - 09.846.0 %   MCV 89.7 78.0 - 100.0 fL   MCH 30.2 26.0 - 34.0 pg   MCHC  33.7 30.0 - 36.0 g/dL   RDW 11.914.5 14.711.5 - 82.915.5 %   Platelets 294 150 - 400 K/uL  Type and screen [facility]   Collection Time: 05/30/16  9:17 AM  Result Value Ref Range   ABO/RH(D) A POS    Antibody Screen NEG    Sample Expiration 06/02/2016     Patient Active Problem List   Diagnosis Date Noted  . Normal labor 05/30/2016  . TINEA VERSICOLOR 08/21/2010  . 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 DYSPNEA ON EXERTION 08/21/2010  . ANEMIA 03/31/2007  . MURMUR 03/31/2007  . CHEST WALL PAIN, HX OF 03/31/2007    Assessment: Cindy [name] is a 20 y.o. G2P0010 at 1154w1d here for  SROM/SOL  #Labor:Anticipate SVD. #Pain: IV pain meds prn/Epidural on request #FWB: Cat I tracing #ID:  GBS positive-PCN #MOF: Breast #MOC:Depo #Circ:  Griffith Citronn/a  WALLACE, NOAH I, DO PGY-3 05/30/2016, 12:43 PM  I have seen and evaluated the patient and I agree with the above documentation and findings.  Luna KitchensKathryn Kooistra CNM

## 2016-05-30 NOTE — Progress Notes (Signed)
S: Patient seen & examined for progress of labor. Patient comfortable. Currently undecided on pain control medication. Has mild elevated BP. Had elevated BP in outpatient setting last week. Denies PIH symptoms currently.    O:  Vitals:   05/30/16 1100 05/30/16 1103 05/30/16 1203 05/30/16 1300  BP: (!) 140/93 (!) 140/93 129/88 (!) 141/88  Pulse: 85  84 74  Resp:   18   Temp: 98.5 F (36.9 C)   98.2 F (36.8 C)  TempSrc: Axillary   Oral  Weight:      Height:        Dilation: 5 Effacement (%): 80 Cervical Position: Middle Station: -2 Presentation: Vertex Exam by:: Earlene PlaterWallace MD   FHT: Cat I tracing TOCO: q773min   A/P: Pre-E Labs pending Continue expectant management Anticipate SVD  Sheria LangNoah Amore Grater,DO PGY-3

## 2016-05-30 NOTE — Anesthesia Preprocedure Evaluation (Signed)
Anesthesia Evaluation  Patient identified by MRN, date of birth, ID band Patient awake    Reviewed: Allergy & Precautions, H&P , Patient's Chart, lab work & pertinent test results  Airway Mallampati: II  TM Distance: >3 FB Neck ROM: full    Dental no notable dental hx.    Pulmonary    Pulmonary exam normal breath sounds clear to auscultation       Cardiovascular Exercise Tolerance: Good  Rhythm:regular Rate:Normal     Neuro/Psych    GI/Hepatic   Endo/Other    Renal/GU      Musculoskeletal   Abdominal   Peds  Hematology   Anesthesia Other Findings   Reproductive/Obstetrics                             Anesthesia Physical Anesthesia Plan  ASA: II  Anesthesia Plan: Epidural   Post-op Pain Management:    Induction:   Airway Management Planned:   Additional Equipment:   Intra-op Plan:   Post-operative Plan:   Informed Consent: I have reviewed the patients History and Physical, chart, labs and discussed the procedure including the risks, benefits and alternatives for the proposed anesthesia with the patient or authorized representative who has indicated his/her understanding and acceptance.   Dental Advisory Given  Plan Discussed with:   Anesthesia Plan Comments: (Labs checked- platelets confirmed with RN in room. Fetal heart tracing, per RN, reported to be stable enough for sitting procedure. Discussed epidural, and patient consents to the procedure:  included risk of possible headache,backache, failed block, allergic reaction, and nerve injury. This patient was asked if she had any questions or concerns before the procedure started.)        Anesthesia Quick Evaluation  

## 2016-05-30 NOTE — Progress Notes (Signed)
Patient resting comfortably with epidural. BP is now 133/89; FHR 135 with accelerations and moderate variability. No decelerations. Anticipate NSVD.

## 2016-05-30 NOTE — Progress Notes (Signed)
S: Patient seen & examined for progress of labor. Patient comfortable with epidural. No PIH symptoms   O:  Vitals:   05/30/16 1600 05/30/16 1630 05/30/16 1700 05/30/16 1730  BP: 130/77 125/80 132/85 (!) 127/95  Pulse: 82 86 82 85  Resp:  18  16  Temp:    98.5 F (36.9 C)  TempSrc:    Oral  SpO2: 100%     Weight:      Height:        Dilation: 9 Effacement (%): 100 Cervical Position: Middle Station: -1 Presentation: Vertex Exam by:: Earlene PlaterWallace MD   FHT: Cat I tracing TOCO: q853min   A/P: Continue expectant management Anticipate SVD  PGY-3

## 2016-05-30 NOTE — MAU Note (Signed)
Pt states she had large gush of fluid @ 0700 & then again @ 0730, clear fluid. Is continuing to leak some, also began having uc's.  Denies bleeding.

## 2016-05-31 ENCOUNTER — Encounter (HOSPITAL_COMMUNITY): Payer: Self-pay

## 2016-05-31 DIAGNOSIS — O4292 Full-term premature rupture of membranes, unspecified as to length of time between rupture and onset of labor: Secondary | ICD-10-CM

## 2016-05-31 DIAGNOSIS — Z3A39 39 weeks gestation of pregnancy: Secondary | ICD-10-CM

## 2016-05-31 DIAGNOSIS — O99824 Streptococcus B carrier state complicating childbirth: Secondary | ICD-10-CM

## 2016-05-31 LAB — RPR: RPR: NONREACTIVE

## 2016-05-31 MED ORDER — BENZOCAINE-MENTHOL 20-0.5 % EX AERO: 1.0000 "application " | INHALATION_SPRAY | CUTANEOUS | Status: DC | PRN

## 2016-05-31 MED ORDER — SIMETHICONE 80 MG PO CHEW: 80.0000 mg | CHEWABLE_TABLET | ORAL | Status: DC | PRN

## 2016-05-31 MED ORDER — DIPHENHYDRAMINE HCL 25 MG PO CAPS: 25.0000 mg | ORAL_CAPSULE | Freq: Four times a day (QID) | ORAL | Status: DC | PRN

## 2016-05-31 MED ORDER — METHYLERGONOVINE MALEATE 0.2 MG PO TABS: 0.2000 mg | ORAL_TABLET | ORAL | Status: DC | PRN

## 2016-05-31 MED ORDER — IBUPROFEN 600 MG PO TABS
600.0000 mg | ORAL_TABLET | Freq: Four times a day (QID) | ORAL | Status: DC
  Administered 2016-05-31 – 2016-06-02 (×7): 600 mg via ORAL
  Filled 2016-05-31 (×8): qty 1

## 2016-05-31 MED ORDER — PRENATAL MULTIVITAMIN CH
1.0000 | ORAL_TABLET | Freq: Every day | ORAL | Status: DC
  Administered 2016-05-31 – 2016-06-02 (×3): 1 via ORAL
  Filled 2016-05-31 (×3): qty 1

## 2016-05-31 MED ORDER — TERBUTALINE SULFATE 1 MG/ML IJ SOLN
0.2500 mg | Freq: Once | INTRAMUSCULAR | Status: DC | PRN
  Filled 2016-05-31: qty 1

## 2016-05-31 MED ORDER — ZOLPIDEM TARTRATE 5 MG PO TABS: 5.0000 mg | ORAL_TABLET | Freq: Every evening | ORAL | Status: DC | PRN

## 2016-05-31 MED ORDER — ONDANSETRON HCL 4 MG/2ML IJ SOLN: 4.0000 mg | INTRAMUSCULAR | Status: DC | PRN

## 2016-05-31 MED ORDER — OXYCODONE HCL 5 MG PO TABS: 10.0000 mg | ORAL_TABLET | ORAL | Status: DC | PRN

## 2016-05-31 MED ORDER — METHYLERGONOVINE MALEATE 0.2 MG/ML IJ SOLN: 0.2000 mg | INTRAMUSCULAR | Status: DC | PRN

## 2016-05-31 MED ORDER — FERROUS SULFATE 325 (65 FE) MG PO TABS
325.0000 mg | ORAL_TABLET | Freq: Two times a day (BID) | ORAL | Status: DC
  Administered 2016-05-31 – 2016-06-02 (×4): 325 mg via ORAL
  Filled 2016-05-31 (×4): qty 1

## 2016-05-31 MED ORDER — ONDANSETRON HCL 4 MG PO TABS: 4.0000 mg | ORAL_TABLET | ORAL | Status: DC | PRN

## 2016-05-31 MED ORDER — DOCUSATE SODIUM 100 MG PO CAPS
100.0000 mg | ORAL_CAPSULE | Freq: Two times a day (BID) | ORAL | Status: DC
Start: 2016-05-31 — End: 2016-06-02
  Administered 2016-05-31 – 2016-06-02 (×4): 100 mg via ORAL
  Filled 2016-05-31 (×4): qty 1

## 2016-05-31 MED ORDER — DIBUCAINE 1 % RE OINT: 1.0000 "application " | TOPICAL_OINTMENT | RECTAL | Status: DC | PRN

## 2016-05-31 MED ORDER — BISACODYL 10 MG RE SUPP: 10.0000 mg | Freq: Every day | RECTAL | Status: DC | PRN

## 2016-05-31 MED ORDER — OXYTOCIN 40 UNITS IN LACTATED RINGERS INFUSION - SIMPLE MED
1.0000 m[IU]/min | INTRAVENOUS | Status: DC
  Administered 2016-05-31: 2 m[IU]/min via INTRAVENOUS

## 2016-05-31 MED ORDER — OXYCODONE HCL 5 MG PO TABS
5.0000 mg | ORAL_TABLET | ORAL | Status: DC | PRN
  Administered 2016-05-31 – 2016-06-01 (×2): 5 mg via ORAL
  Filled 2016-05-31 (×2): qty 1

## 2016-05-31 MED ORDER — COCONUT OIL OIL
1.0000 "application " | TOPICAL_OIL | Status: DC | PRN
  Administered 2016-06-01: 1 via TOPICAL
  Filled 2016-05-31: qty 120

## 2016-05-31 MED ORDER — WITCH HAZEL-GLYCERIN EX PADS: 1.0000 "application " | MEDICATED_PAD | CUTANEOUS | Status: DC | PRN

## 2016-05-31 MED ORDER — FLEET ENEMA 7-19 GM/118ML RE ENEM: 1.0000 | ENEMA | Freq: Every day | RECTAL | Status: DC | PRN

## 2016-05-31 MED ORDER — MEASLES, MUMPS & RUBELLA VAC ~~LOC~~ INJ
0.5000 mL | INJECTION | Freq: Once | SUBCUTANEOUS | Status: DC
  Filled 2016-05-31: qty 0.5

## 2016-05-31 MED ORDER — TETANUS-DIPHTH-ACELL PERTUSSIS 5-2.5-18.5 LF-MCG/0.5 IM SUSP: 0.5000 mL | Freq: Once | INTRAMUSCULAR | Status: DC

## 2016-05-31 NOTE — Anesthesia Postprocedure Evaluation (Signed)
Anesthesia Post Note  Patient: Cindy [name]  Procedure(s) Performed: * No procedures listed *  Patient location during evaluation: Mother Baby Anesthesia Type: Epidural Level of consciousness: awake Pain management: pain level controlled Vital Signs Assessment: post-procedure vital signs reviewed and stable Respiratory status: spontaneous breathing Cardiovascular status: stable Postop Assessment: no headache, no backache, epidural receding, patient able to bend at knees, no signs of nausea or vomiting and adequate PO intake Anesthetic complications: no     Last Vitals:  Vitals:   05/31/16 0930 05/31/16 1315  BP: 126/74 119/75  Pulse: 78 79  Resp: 16 16  Temp: 37.3 C 36.9 C    Last Pain:  Vitals:   05/31/16 1315  TempSrc: Oral  PainSc: 2    Pain Goal: Patients Stated Pain Goal: 3 (05/30/16 1850)               Fanny DanceMULLINS,Latania Bascomb

## 2016-05-31 NOTE — Lactation Note (Signed)
This note was copied from a baby's chart. Lactation Consultation Note  Patient Name: Cindy [name] ZOXWR'UToday's Date: 05/31/2016 Reason for consult: Initial assessment Baby at 12 hr of life. Upon entry RN had assisted mom with latching baby in the football position to the L breast. Baby appeared to have a deep latch and rhythmic suck with a few swallows. Mom is reporting bilateral nipple soreness, no skin break down or bruising noted. Offered coconut oil. Mom is worried that baby is not eating enough. Discussed baby behavior, feeding frequency, baby belly size, voids, wt loss, breast changes, and nipple care. Mom stated she can manually express and has spoon in the room. Given lactation handouts. Aware of OP services and support group.   Maternal Data Has patient been taught Hand Expression?: Yes Does the patient have breastfeeding experience prior to this delivery?: No  Feeding Feeding Type: Breast Fed Length of feed: 10 min  LATCH Score/Interventions Latch: Repeated attempts needed to sustain latch, nipple held in mouth throughout feeding, stimulation needed to elicit sucking reflex. Intervention(s): Adjust position;Assist with latch  Audible Swallowing: None Intervention(s): Hand expression;Skin to skin  Type of Nipple: Everted at rest and after stimulation  Comfort (Breast/Nipple): Soft / non-tender     Hold (Positioning): Assistance needed to correctly position infant at breast and maintain latch.  LATCH Score: 8  Lactation Tools Discussed/Used WIC Program: Yes   Consult Status Consult Status: Follow-up Date: 06/01/16 Follow-up type: In-patient    [name] 05/31/2016, 5:52 PM
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
## 2016-06-01 NOTE — Progress Notes (Signed)
Post Partum Day 1 Subjective: no complaints, up ad lib, voiding and tolerating PO  Objective: Blood pressure (!) 114/59, pulse 82, temperature 98.1 F (36.7 C), temperature source Oral, resp. rate 18, height 5\' 4"  (1.626 m), weight 156 lb (70.8 kg), last menstrual period 08/30/2015, SpO2 100 %, unknown if currently breastfeeding.  Physical Exam:  General: alert, cooperative and no distress Lochia: appropriate Uterine Fundus: firm Incision: healing well DVT Evaluation: No evidence of DVT seen on physical exam.   Recent Labs  05/30/16 0917  HGB 10.6*  HCT 31.5*    Assessment/Plan: Plan for discharge tomorrow and Breastfeeding   LOS: 2 days   [facility] HospitalWILLIAMS,Cindy [name] 06/01/2016, 5:22 PM

## 2016-06-02 ENCOUNTER — Ambulatory Visit: Payer: Self-pay

## 2016-06-02 MED ORDER — IBUPROFEN 600 MG PO TABS: 600.0000 mg | ORAL_TABLET | Freq: Four times a day (QID) | ORAL | 0 refills | Status: DC

## 2016-06-02 NOTE — Discharge Instructions (Signed)

## 2016-06-02 NOTE — Discharge Summary (Signed)
Obstetric Discharge Summary Reason for Admission: onset of labor Prenatal Procedures: none Intrapartum Procedures: spontaneous vaginal delivery Postpartum Procedures: none Complications-Operative and Postpartum: 1st degree perineal laceration Hemoglobin  Date Value Ref Range Status  05/30/2016 10.6 (L) 12.0 - 15.0 g/dL Final   HCT  Date Value Ref Range Status  05/30/2016 31.5 (L) 36.0 - 46.0 % Final    Physical Exam:  General: alert, cooperative and no distress Lochia: appropriate Uterine Fundus: firm Incision: n/a DVT Evaluation: No evidence of DVT seen on physical exam. Negative Homan's sign. No cords or calf tenderness. No significant calf/ankle edema.  Discharge Diagnoses: Term Pregnancy-delivered  Discharge Information: Date: 06/02/2016 Activity: pelvic rest Diet: routine Medications: PNV and Ibuprofen Condition: stable Instructions: refer to practice specific booklet Discharge to: home  Contraception: POPs [facility] Information    [facility] Schedule an appointment as soon as possible for a visit in 4 week(s).   Why:  For postpartum visit/contraception Contact information: [address] [place] [address] [phone]           Newborn Data: Live born female  Birth Weight: 6 lb 12.3 oz (3070 g) APGAR: 9, 9  Home with mother.  LEFTWICH-KIRBY, LISA 06/02/2016, 10:56 AM

## 2016-06-02 NOTE — Lactation Note (Signed)
This note was copied from a baby's chart. Lactation Consultation Note: Mother has bilateral strips on her nipples. She has been breastfeeding on one side and now the other side is sore. Assist the mother in football hold. Taught mother how to flange infants lips for wider gape. Mother states no pain with the latch. Advised mother to breastfeed infant 8-12 times in 24 hours and with all feeding cues. Mother has a pump at home and was advised to post pump and offer infant extra calories using spoon or curved tip syringe. Mother may want introduce a bottle Nipples. Mother agreeable with plan. Continue to cue base feed and at least 8-12 times in 24/hr.    Patient Name: Girl Theresa DutyRashawna Steve XBJYN'WToday's Date: 06/02/2016 Reason for consult: Follow-up assessment   Maternal Data    Feeding Feeding Type: Breast Fed  [facility] Score/Interventions                      Lactation Tools Discussed/Used     Consult Status      Michel BickersKendrick, Cipriana Biller McCoy 06/02/2016, 1:56 PM

## 2016-06-24 IMAGING — US US OB COMP LESS 14 WK
1 series · 13 of 28 positions shown · non-contrast
Comparison: None.

CLINICAL DATA: 18-year-old G1, LMP 09/12/2014 ([DATE] weeks 3 days),
presenting with vaginal bleeding that began yesterday. Quantitative
beta HCG pending.

EXAM:
OBSTETRIC <14 WK US AND TRANSVAGINAL OB US
TECHNIQUE: Both transabdominal and transvaginal ultrasound examinations were
performed for complete evaluation of the gestation as well as the
maternal uterus, adnexal regions, and pelvic cul-de-sac.
Transvaginal technique was performed to assess early pregnancy.

[Series 1: us ob comp less 14 wk · 38 acquisitions, 13 frames shown]
[im 2/38]
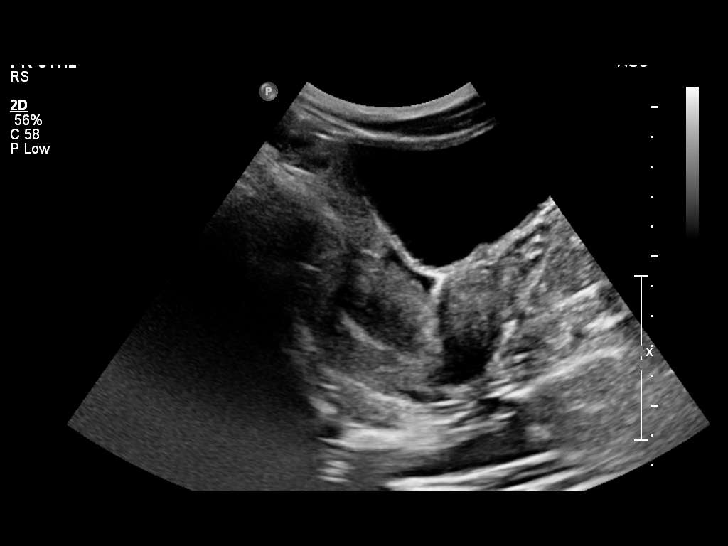
[im 5/38]
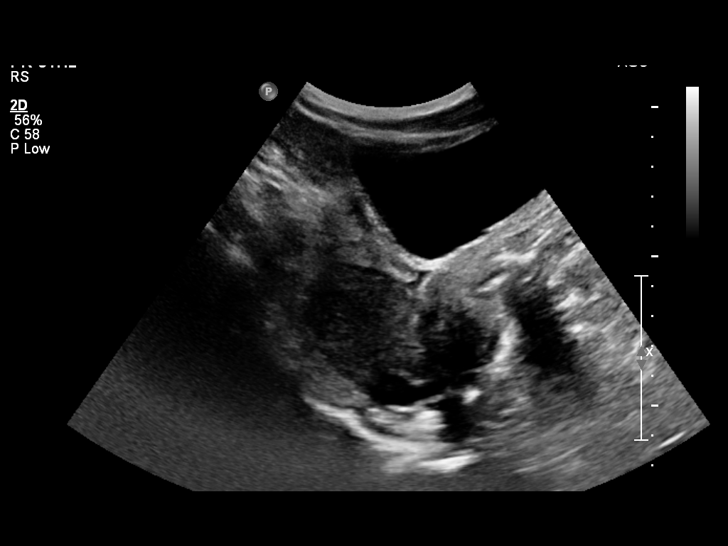
[im 7/38]
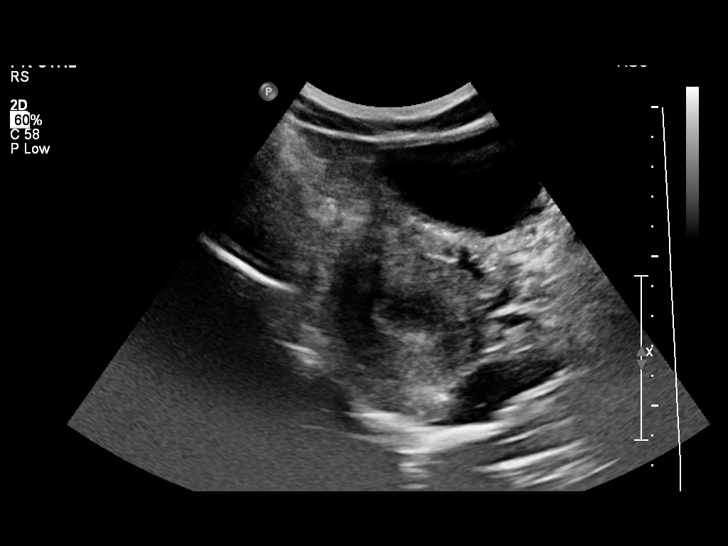
[im 10/38]
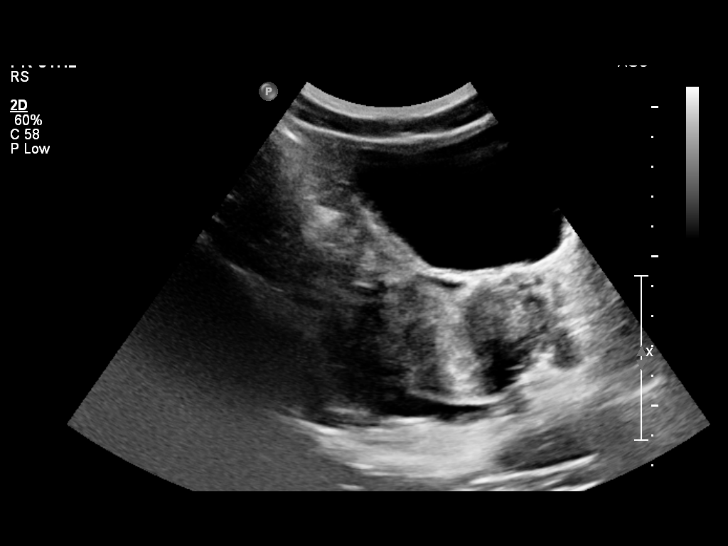
[im 13/38]
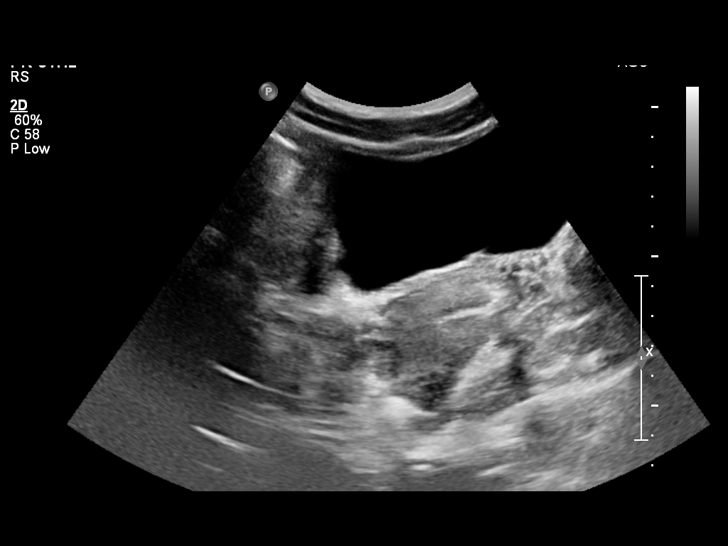
[im 16/38]
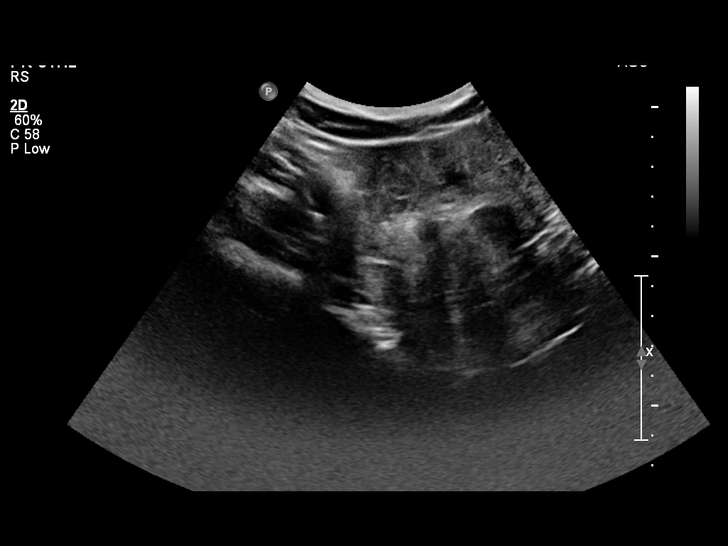
[im 20/38]
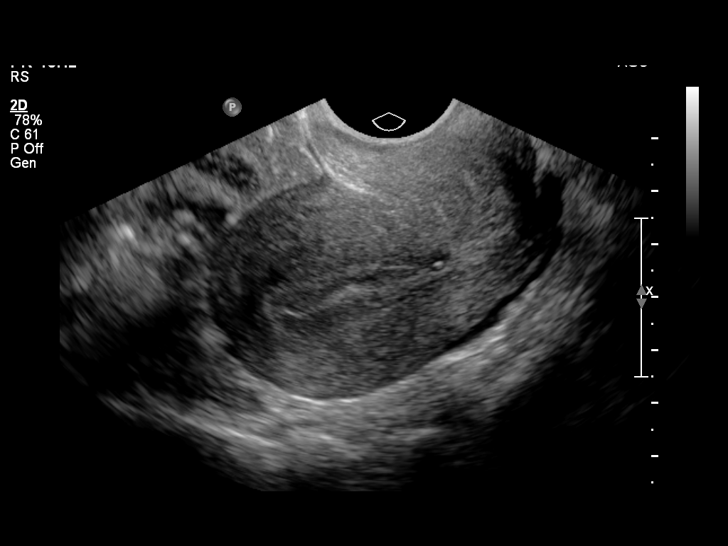
[im 22/38]
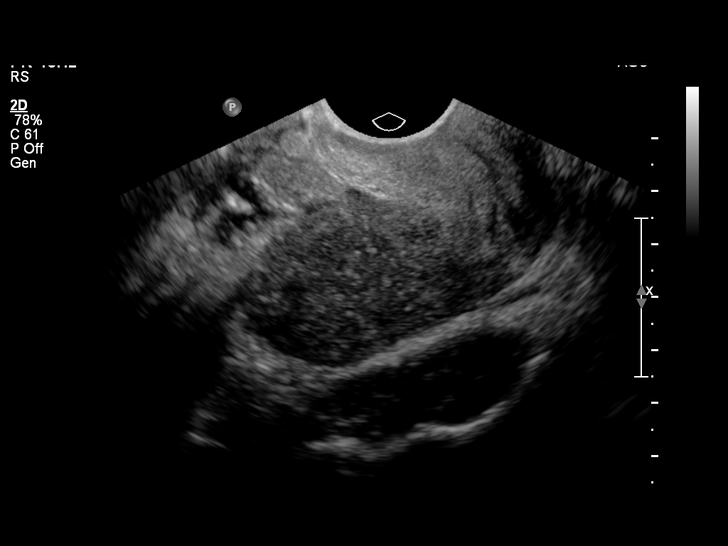
[im 25/38]
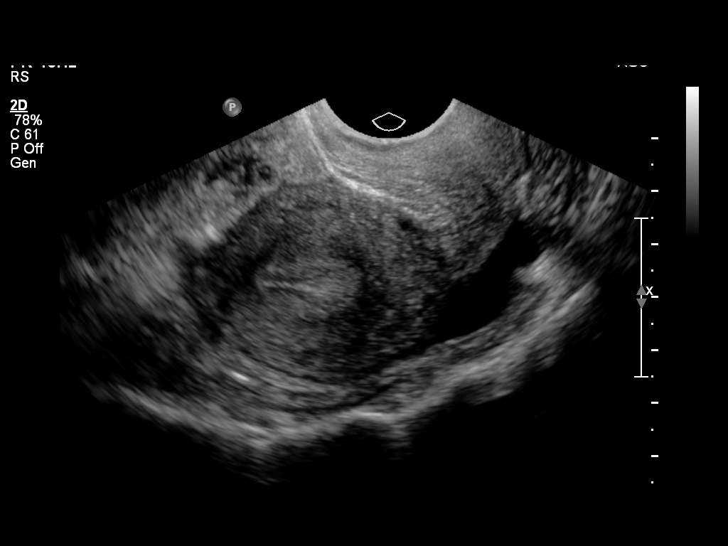
[im 28/38]
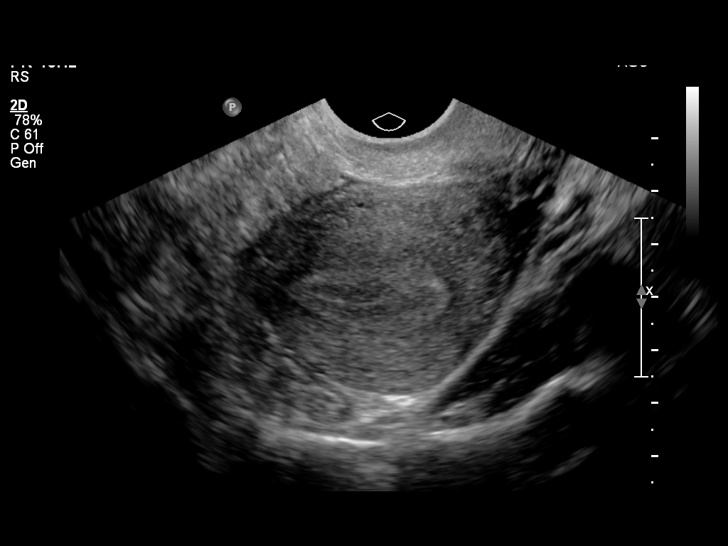
[im 31/38]
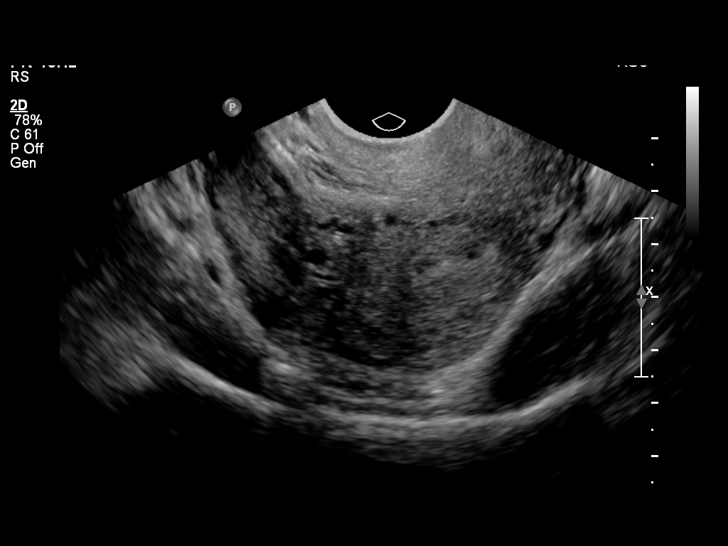
[im 33/38]
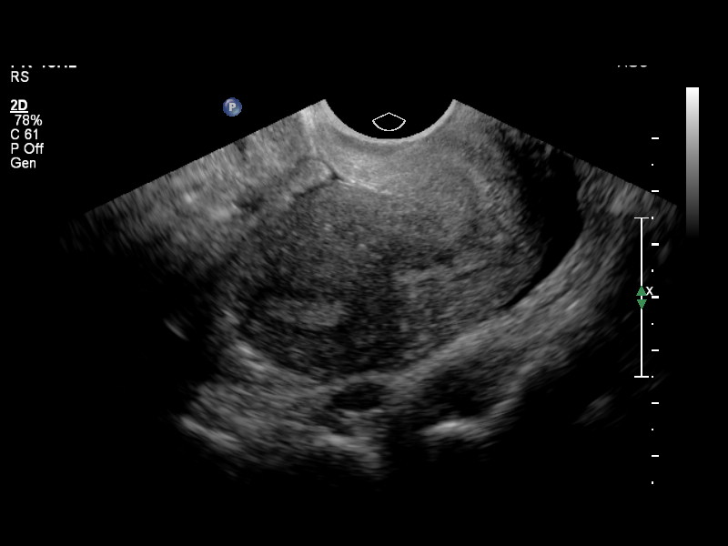
[im 36/38]
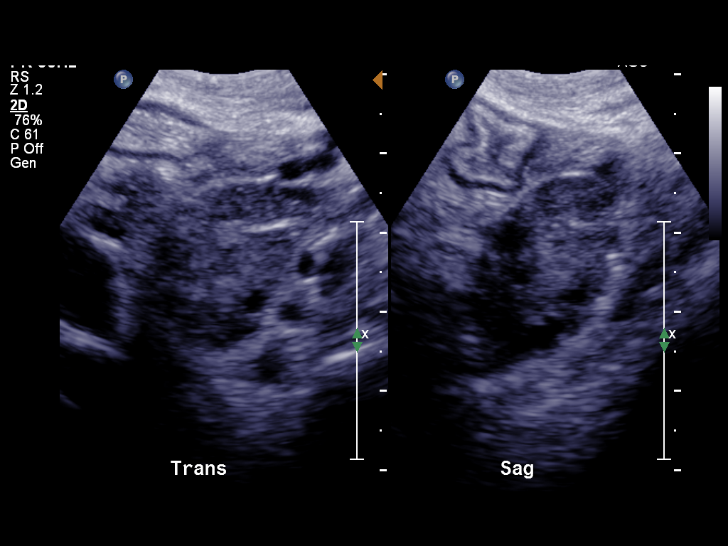

[13 of 28 positions shown; findings below may reference images not displayed]

FINDINGS: Intrauterine gestational sac: Not present.

Yolk sac:  Not present.

Embryo:  Not present.

Cardiac Activity: Not applicable.

MSD: Not applicable.

US EDC: Not applicable.

Maternal uterus/adnexae: Endometrial thickening up to approximately
14 mm. Normal-appearing ovaries bilaterally, each containing small
follicular cysts. Right ovary measures approximately 3.5 x 1.6 x
cm. Left ovary measures approximately 2.5 x 1.6 x 1.8 cm. Normal
power Doppler flow in both ovaries. Small amount of free fluid in
the cul-de-sac.
IMPRESSION: 1. No evidence of intrauterine pregnancy. Please correlate with
quantitative beta HCG levels when available.
2. Endometrial thickening up to approximately 14 mm.
3. Normal-appearing ovaries.
4. No adnexal masses.  Small amount of free fluid in the cul-de-sac.

## 2016-07-25 ENCOUNTER — Inpatient Hospital Stay (HOSPITAL_COMMUNITY)
Admission: AD | Admit: 2016-07-25 | Discharge: 2016-07-25 | Disposition: A | Discharge status: Home / Self Care | Payer: Medicaid Other | Source: Ambulatory Visit | Attending: Obstetrics and Gynecology | Admitting: Obstetrics and Gynecology

## 2016-07-25 ENCOUNTER — Encounter (HOSPITAL_COMMUNITY): Payer: Self-pay

## 2016-07-25 DIAGNOSIS — Z711 Person with feared health complaint in whom no diagnosis is made: Secondary | ICD-10-CM | POA: Diagnosis not present

## 2016-07-25 DIAGNOSIS — O9089 Other complications of the puerperium, not elsewhere classified: Secondary | ICD-10-CM | POA: Diagnosis present

## 2016-07-25 NOTE — MAU Note (Signed)
Pt reports she delivered on December 8. Had a repiar opf a 1st degree lac. Was told  Stiches would desolve in about 3 week. Pt stated she still feels the stiches and wants to make sure everything is ok. Not really having any pain or discomfort.

## 2016-07-25 NOTE — MAU Provider Note (Signed)
  History     CSN: [id number]  Arrival date and time: 07/25/16 1150   First Provider Initiated Contact with Patient 07/25/16 1351      Chief Complaint  Patient presents with  . Wound Check   Non-pregnant female here with concerns that stitches from delivery are still present. She is 8 weeks postpartum. She has not resumed IC. Nothing per vagina. She reports tenderness at times. She reports boyfriend saw stitches there about a day ago and he wants to resume sex. She has not been seen for pp visit yet, it's scheduled in 1 week. She is planning to use OCP for contraception. She has no other complaints. Review of delivery note shows 1st degree perineal lac repaired with Vicryl suture.    Past Medical History:  Diagnosis Date  . Heart murmur   . Kidney stone     Past Surgical History:  Procedure Laterality Date  . NO PAST SURGERIES      History reviewed. No pertinent family history.  Social History  Substance Use Topics  . Smoking status: Never Smoker  . Smokeless tobacco: Never Used  . Alcohol use No     Comment: Denies current drup use 09/27/2015    Allergies:  Allergies  Allergen Reactions  . Acetaminophen Swelling    Causes swelling of the face.    Prescriptions Prior to Admission  Medication Sig Dispense Refill Last Dose  . ibuprofen (ADVIL,MOTRIN) 600 MG tablet Take 1 tablet (600 mg total) by mouth every 6 (six) hours. 30 tablet 0   . Prenatal Vit-Fe Fumarate-FA (PRENATAL MULTIVITAMIN) TABS tablet Take 1 tablet by mouth daily at 12 noon.   Past Week at Unknown time    Review of Systems  Constitutional: Negative for fever.  Genitourinary: Positive for vaginal pain. Negative for vaginal bleeding and vaginal discharge.   Physical Exam   Blood pressure 106/56, pulse 83, temperature 98.3 F (36.8 C), resp. rate 18, height 5\' 5"  (1.651 m), weight 64 kg (141 lb), unknown if currently breastfeeding.  Physical Exam  Nursing note and vitals reviewed. Constitutional:  She is oriented to person, place, and time. She appears well-developed and well-nourished. No distress.  HENT:  Head: Normocephalic and atraumatic.  Neck: Normal range of motion.  Cardiovascular: Normal rate.   Respiratory: Effort normal.  Genitourinary:  Genitourinary Comments: External: no lesions or erythema Perineum intact and well healed, no suture present  Musculoskeletal: Normal range of motion.  Neurological: She is alert and oriented to person, place, and time.  Skin: Skin is warm and dry.  Psychiatric: She has a normal mood and affect.    MAU Course  Procedures  MDM No signs of suture material present, perineum well healed. Stable for discharge home.  Assessment and Plan   1. Postpartum state   2. Physically well but worried    [facility] Abstain from IC until has contraception  Allergies as of 07/25/2016      Reactions   Acetaminophen Swelling   Causes swelling of the face.      Medication List    STOP taking these medications   ibuprofen 600 MG tablet Commonly known as:  ADVIL,MOTRIN   prenatal multivitamin Tabs tablet      Donette LarryMelanie Maryana Pittmon, CNM 07/25/2016, 1:53 PM

## 2018-01-23 DIAGNOSIS — Z30011 Encounter for initial prescription of contraceptive pills: Secondary | ICD-10-CM | POA: Diagnosis not present

## 2018-07-10 ENCOUNTER — Inpatient Hospital Stay (HOSPITAL_COMMUNITY)
Admission: AD | Admit: 2018-07-10 | Discharge: 2018-07-10 | Disposition: A | Discharge status: Home / Self Care | Payer: Self-pay | Attending: Obstetrics & Gynecology | Admitting: Obstetrics & Gynecology

## 2018-07-10 ENCOUNTER — Encounter (HOSPITAL_COMMUNITY): Payer: Self-pay

## 2018-07-10 ENCOUNTER — Inpatient Hospital Stay (HOSPITAL_COMMUNITY): Payer: Self-pay

## 2018-07-10 DIAGNOSIS — O3680X0 Pregnancy with inconclusive fetal viability, not applicable or unspecified: Secondary | ICD-10-CM

## 2018-07-10 DIAGNOSIS — Z3A01 Less than 8 weeks gestation of pregnancy: Secondary | ICD-10-CM | POA: Insufficient documentation

## 2018-07-10 DIAGNOSIS — O209 Hemorrhage in early pregnancy, unspecified: Secondary | ICD-10-CM | POA: Insufficient documentation

## 2018-07-10 LAB — CBC
HCT: 34.8 % — ABNORMAL LOW (ref 36.0–46.0)
HEMOGLOBIN: 11.4 g/dL — AB (ref 12.0–15.0)
MCH: 31.1 pg (ref 26.0–34.0)
MCHC: 32.8 g/dL (ref 30.0–36.0)
MCV: 95.1 fL (ref 80.0–100.0)
Platelets: 303 10*3/uL (ref 150–400)
RBC: 3.66 MIL/uL — ABNORMAL LOW (ref 3.87–5.11)
RDW: 14.1 % (ref 11.5–15.5)
WBC: 4.5 10*3/uL (ref 4.0–10.5)
nRBC: 0 % (ref 0.0–0.2)

## 2018-07-10 LAB — URINALYSIS, ROUTINE W REFLEX MICROSCOPIC
Bilirubin Urine: NEGATIVE
GLUCOSE, UA: NEGATIVE mg/dL
Hgb urine dipstick: NEGATIVE
Ketones, ur: NEGATIVE mg/dL
Nitrite: NEGATIVE
PH: 6 (ref 5.0–8.0)
Protein, ur: NEGATIVE mg/dL
Specific Gravity, Urine: 1.015 (ref 1.005–1.030)

## 2018-07-10 LAB — HCG, QUANTITATIVE, PREGNANCY: hCG, Beta Chain, Quant, S: 201 m[IU]/mL — ABNORMAL HIGH (ref <5)

## 2018-07-10 LAB — WET PREP, GENITAL
CLUE CELLS WET PREP: NONE SEEN
Sperm: NONE SEEN
Trich, Wet Prep: NONE SEEN
Yeast Wet Prep HPF POC: NONE SEEN

## 2018-07-10 LAB — HIV ANTIBODY (ROUTINE TESTING W REFLEX): HIV Screen 4th Generation wRfx: NONREACTIVE

## 2018-07-10 LAB — POCT PREGNANCY, URINE: Preg Test, Ur: POSITIVE — AB

## 2018-07-10 NOTE — MAU Provider Note (Signed)
Chief Complaint: Vaginal Bleeding   First Provider Initiated Contact with Patient 07/10/18 0855      SUBJECTIVE HPI: Cindy [name] is a 23 y.o. G3P1011 at 5110w4d by LMP who presents to maternity admissions reporting positive pregnancy test on 07/02/18, and onset of intermittent spotting and cramping on 07/06/18.  The pain is mild, lower abdominal intermittent cramping pain.  It does not radiate. The spotting is brown, seen when wiping, not requiring a pad.  She has not tried any treatments. There are no other symptoms.   HPI  Past Medical History:  Diagnosis Date  . Heart murmur   . Kidney stone    Past Surgical History:  Procedure Laterality Date  . NO PAST SURGERIES     Social History   Socioeconomic History  . Marital status: Single    Spouse name: Not on file  . Number of children: Not on file  . Years of education: Not on file  . Highest education level: Not on file  Occupational History  . Not on file  Social Needs  . Financial resource strain: Not on file  . Food insecurity:    Worry: Not on file    Inability: Not on file  . Transportation needs:    Medical: Not on file    Non-medical: Not on file  Tobacco Use  . Smoking status: Never Smoker  . Smokeless tobacco: Never Used  Substance and Sexual Activity  . Alcohol use: No  . Drug use: Yes    Types: Marijuana    Comment: daily use   . Sexual activity: Yes    Birth control/protection: None  Lifestyle  . Physical activity:    Days per week: Not on file    Minutes per session: Not on file  . Stress: Not on file  Relationships  . Social connections:    Talks on phone: Not on file    Gets together: Not on file    Attends religious service: Not on file    Active member of club or organization: Not on file    Attends meetings of clubs or organizations: Not on file    Relationship status: Not on file  . Intimate partner violence:    Fear of current or ex partner: Not on file    Emotionally abused: Not on  file    Physically abused: Not on file    Forced sexual activity: Not on file  Other Topics Concern  . Not on file  Social History Narrative  . Not on file   No current facility-administered medications on file prior to encounter.    No current outpatient medications on file prior to encounter.   Allergies  Allergen Reactions  . Acetaminophen Swelling    Causes swelling of the face.    ROS:  Review of Systems  Constitutional: Negative for chills, fatigue and fever.  Respiratory: Negative for shortness of breath.   Cardiovascular: Negative for chest pain.  Gastrointestinal: Positive for abdominal pain.  Genitourinary: Positive for pelvic pain and vaginal bleeding. Negative for difficulty urinating, dysuria, flank pain, vaginal discharge and vaginal pain.  Neurological: Negative for dizziness and headaches.  Psychiatric/Behavioral: Negative.      I have reviewed patient's Past Medical Hx, Surgical Hx, Family Hx, Social Hx, medications and allergies.   Physical Exam   Patient Vitals for the past 24 hrs:  BP Temp Temp src Pulse Resp  07/10/18 0758 (!) 125/57 98.2 F (36.8 C) Oral 70 16   Constitutional: Well-developed,  well-nourished female in no acute distress.  Cardiovascular: normal rate Respiratory: normal effort GI: Abd soft, non-tender. Pos BS x 4 MS: Extremities nontender, no edema, normal ROM Neurologic: Alert and oriented x 4.  GU: Neg CVAT.  PELVIC EXAM: Cervix pink, visually closed, without lesion, scant white creamy discharge, no bleeding noted, vaginal walls and external genitalia normal Bimanual exam: Cervix 0/long/high, firm, anterior, neg CMT, uterus nontender, nonenlarged, adnexa without tenderness, enlargement, or mass   LAB RESULTS Results for orders placed or performed during the hospital encounter of 07/10/18 (from the past 24 hour(s))  Urinalysis, Routine w reflex microscopic     Status: Abnormal   Collection Time: 07/10/18  7:46 AM  Result  Value Ref Range   Color, Urine YELLOW YELLOW   APPearance HAZY (A) CLEAR   Specific Gravity, Urine 1.015 1.005 - 1.030   pH 6.0 5.0 - 8.0   Glucose, UA NEGATIVE NEGATIVE mg/dL   Hgb urine dipstick NEGATIVE NEGATIVE   Bilirubin Urine NEGATIVE NEGATIVE   Ketones, ur NEGATIVE NEGATIVE mg/dL   Protein, ur NEGATIVE NEGATIVE mg/dL   Nitrite NEGATIVE NEGATIVE   Leukocytes, UA TRACE (A) NEGATIVE   RBC / HPF 0-5 0 - 5 RBC/hpf   WBC, UA 0-5 0 - 5 WBC/hpf   Bacteria, UA RARE (A) NONE SEEN   Squamous Epithelial / LPF 6-10 0 - 5   Mucus PRESENT   Pregnancy, urine POC     Status: Abnormal   Collection Time: 07/10/18  7:56 AM  Result Value Ref Range   Preg Test, Ur POSITIVE (A) NEGATIVE  CBC     Status: Abnormal   Collection Time: 07/10/18  8:01 AM  Result Value Ref Range   WBC 4.5 4.0 - 10.5 K/uL   RBC 3.66 (L) 3.87 - 5.11 MIL/uL   Hemoglobin 11.4 (L) 12.0 - 15.0 g/dL   HCT 10.2 (L) 58.5 - 27.7 %   MCV 95.1 80.0 - 100.0 fL   MCH 31.1 26.0 - 34.0 pg   MCHC 32.8 30.0 - 36.0 g/dL   RDW 82.4 23.5 - 36.1 %   Platelets 303 150 - 400 K/uL   nRBC 0.0 0.0 - 0.2 %  hCG, quantitative, pregnancy     Status: Abnormal   Collection Time: 07/10/18  8:01 AM  Result Value Ref Range   hCG, Beta Chain, Quant, S 201 (H) <5 mIU/mL  Wet prep, genital     Status: Abnormal   Collection Time: 07/10/18  9:23 AM  Result Value Ref Range   Yeast Wet Prep HPF POC NONE SEEN NONE SEEN   Trich, Wet Prep NONE SEEN NONE SEEN   Clue Cells Wet Prep HPF POC NONE SEEN NONE SEEN   WBC, Wet Prep HPF POC FEW (A) NONE SEEN   Sperm NONE SEEN        IMAGING US Ob Less Than 14 Weeks With Ob Transvaginal  Result Date: 07/10/2018 CLINICAL DATA:  Vaginal bleeding in 1st trimester pregnancy. Unknown LMP. EXAM: OBSTETRIC <14 WK [place] AND TRANSVAGINAL OB US TECHNIQUE: Both transabdominal and transvaginal ultrasound examinations were performed for complete evaluation of the gestation as well as the maternal uterus, adnexal regions,  and pelvic cul-de-sac. Transvaginal technique was performed to assess early pregnancy. COMPARISON:  None. FINDINGS: Intrauterine gestational sac: None Maternal uterus/adnexae: Endometrial thickness measures 11 mm. No fibroids identified. Both ovaries are normal appearance. No adnexal mass identified. Tiny amount of simple free fluid noted in cul-de-sac. IMPRESSION: Pregnancy of unknown anatomic location (no intrauterine  gestational sac or adnexal mass identified). Differential diagnosis includes recent spontaneous abortion, IUP too early to visualize, and non-visualized ectopic pregnancy. Recommend correlation with serial beta-hCG levels, and follow up US if warranted clinically. Electronically Signed   By: Myles Rosenthal M.D.   On: 07/10/2018 10:49    MAU Management/MDM: Orders Placed This Encounter  Procedures  . Wet prep, genital  . US OB LESS THAN 14 WEEKS WITH OB TRANSVAGINAL  . Urinalysis, Routine w reflex microscopic  . CBC  . hCG, quantitative, pregnancy  . HIV Antibody (routine testing w rflx)  . Pregnancy, urine POC  . Discharge patient    No orders of the defined types were placed in this encounter.   Findings today could represent a normal early pregnancy, spontaneous abortion or ectopic pregnancy which can be life-threatening.  Ectopic precautions were given to the patient with plan to return in 48 hours for repeat quant hcg to evaluate pregnancy development. Pt discharged with strict ectopic/bleeding precautions.  ASSESSMENT 1. Pregnancy of unknown anatomic location   2. Vaginal bleeding in pregnancy, first trimester     PLAN Discharge home  Allergies as of 07/10/2018      Reactions   Acetaminophen Swelling   Causes swelling of the face.      Medication List    You have not been prescribed any medications.    Follow-up Information    [facility] [place] [facility] Follow up.   Why:  Return to MAU in 48 hours, on Sunday 07/12/18 after 10 am for repeat labs.  Return  sooner for emergencies. Contact information: [address] [place] [phone]          Sharen Counter Certified Nurse-Midwife 07/10/2018  11:06 AM

## 2018-07-10 NOTE — MAU Note (Signed)
Pt presents to MAU with c/o brown to light pink spotting that started on Monday 07/06/18. Pt had +HPT on 07/02/2018. She has had mild cramping off and on during this period of time.

## 2018-07-12 ENCOUNTER — Inpatient Hospital Stay (HOSPITAL_COMMUNITY)
Admission: AD | Admit: 2018-07-12 | Discharge: 2018-07-12 | Disposition: A | Discharge status: Home / Self Care | Payer: Medicaid Other | Source: Ambulatory Visit | Attending: Obstetrics & Gynecology | Admitting: Obstetrics & Gynecology

## 2018-07-12 DIAGNOSIS — Z3A01 Less than 8 weeks gestation of pregnancy: Secondary | ICD-10-CM | POA: Insufficient documentation

## 2018-07-12 DIAGNOSIS — O0281 Inappropriate change in quantitative human chorionic gonadotropin (hCG) in early pregnancy: Secondary | ICD-10-CM | POA: Insufficient documentation

## 2018-07-12 DIAGNOSIS — O3680X0 Pregnancy with inconclusive fetal viability, not applicable or unspecified: Secondary | ICD-10-CM

## 2018-07-12 LAB — HCG, QUANTITATIVE, PREGNANCY: hCG, Beta Chain, Quant, S: 357 m[IU]/mL — ABNORMAL HIGH (ref <5)

## 2018-07-12 NOTE — Discharge Instructions (Signed)

## 2018-07-12 NOTE — MAU Note (Signed)
Pt here for follow up labwork. Was here the other day with cramping and spotting. No bleeding today and reports mild cramping.

## 2018-07-12 NOTE — MAU Provider Note (Signed)
History   Chief Complaint:  Follow-up  Cindy [name] is  23 y.o. G3P1011 Patient's last menstrual period was 06/01/2018.Marland Kitchen Patient is here for follow up of quantitative HCG and ongoing surveillance of pregnancy status.   She is [redacted]w[redacted]d weeks gestation  by LMP.    Since her last visit, the patient is without new complaint.   The patient reports bleeding as none now.  She reports intermittent cramping but less than 2 days ago.  General ROS:  positive for abdominal cramping  Her previous Quantitative HCG values are: Results for LAFREDA, OUIMETTE (MRN [id number]) as of 07/12/2018 22:03  Ref. Range 07/10/2018 08:01  HCG, Beta Chain, Quant, S Latest Ref Range: <5 mIU/mL 201 (H)    Physical Exam   Blood pressure (!) 113/58, pulse 70, temperature 98.6 F (37 C), temperature source Oral, resp. rate 16, height 5\' 5"  (1.651 m), weight 52.6 kg, last menstrual period 06/01/2018, SpO2 100 %, unknown if currently breastfeeding.  Focused Gynecological Exam: examination not indicated  Labs: Results for orders placed or performed during the hospital encounter of 07/12/18 (from the past 24 hour(s))  hCG, quantitative, pregnancy   Collection Time: 07/12/18  6:43 PM  Result Value Ref Range   hCG, Beta Chain, Quant, S 357 (H) <5 mIU/mL   Assessment: 1. Pregnancy of unknown anatomic location   2. Inappropriate change in quantitative hCG in early pregnancy    Discussed with patient inappropriate rise in HCG which could indicate ectopic pregnancy. This is a desired pregnancy and patient is without new complaint. Discussed with patient repeating HCG and patient desires to wait to repeat labs. Strict ectopic precautions reviewed with patient.    Plan: The patient is instructed to follow up in on 1/22 at [facility] for repeat blood work. Abdominal pain and vaginal bleeding precautions reviewed at length with patient.  Patient may return to MAU if condition changes or worsens.  Rolm Bookbinder CNM 07/12/2018,  8:33 PM

## 2018-07-13 LAB — GC/CHLAMYDIA PROBE AMP (~~LOC~~) NOT AT ARMC
Chlamydia: NEGATIVE
Neisseria Gonorrhea: NEGATIVE

## 2018-07-14 ENCOUNTER — Encounter (HOSPITAL_COMMUNITY): Payer: Self-pay | Admitting: Emergency Medicine

## 2018-07-14 ENCOUNTER — Inpatient Hospital Stay (HOSPITAL_COMMUNITY)
Admission: AD | Admit: 2018-07-14 | Discharge: 2018-07-14 | Disposition: A | Discharge status: Home / Self Care | Payer: Medicaid Other | Attending: Obstetrics and Gynecology | Admitting: Obstetrics and Gynecology

## 2018-07-14 ENCOUNTER — Other Ambulatory Visit: Payer: Self-pay

## 2018-07-14 DIAGNOSIS — O209 Hemorrhage in early pregnancy, unspecified: Secondary | ICD-10-CM | POA: Insufficient documentation

## 2018-07-14 DIAGNOSIS — Z3A01 Less than 8 weeks gestation of pregnancy: Secondary | ICD-10-CM

## 2018-07-14 DIAGNOSIS — O3680X0 Pregnancy with inconclusive fetal viability, not applicable or unspecified: Secondary | ICD-10-CM

## 2018-07-14 LAB — HCG, QUANTITATIVE, PREGNANCY: hCG, Beta Chain, Quant, S: 438 m[IU]/mL — ABNORMAL HIGH (ref <5)

## 2018-07-14 NOTE — MAU Note (Signed)
Was here on Fri for cramping and spotting.  , returned Sunday for hormone level.  Started bleeding again today, so came back. No pain right now. Hx of ectopic

## 2018-07-14 NOTE — Discharge Instructions (Signed)
Human Chorionic Gonadotropin Test Why am I having this test? A human chorionic gonadotropin (hCG) test is done to determine whether you are pregnant. It can also be used:  To diagnose an abnormal pregnancy.  To determine whether you have had a failed pregnancy (miscarriage) or are at risk of one. What is being tested? This test checks the level of the human chorionic gonadotropin (hCG) hormone in the blood. This hormone is produced during pregnancy by the cells that form the placenta. The placenta is the organ that grows inside your womb (uterus) to nourish a developing baby. When you are pregnant, hCG can be detected in your blood or urine 7 to 8 days before your missed period. It continues to go up for the first 8-10 weeks of pregnancy. The presence of hCG in your blood can be measured with several different types of tests. You may have:  A urine test. ? Because this hormone is eliminated from your body by your kidneys, you may have a urine test to find out whether you are pregnant. A home pregnancy test detects whether there is hCG in your urine. ? A urine test only shows whether there is hCG in your urine. It does not measure how much.  A qualitative blood test. ? You may have this type of blood test to find out if you are pregnant. ? This blood test only shows whether there is hCG in your blood. It does not measure how much.  A quantitative blood test. ? This type of blood test measures the amount of hCG in your blood. ? You may have this test to:  Diagnose an abnormal pregnancy.  Check whether you have had a miscarriage.  Determine whether you are at risk of a miscarriage. What kind of sample is taken?     Two kinds of samples may be collected to test for the hCG hormone.  Blood. It is usually collected by inserting a needle into a blood vessel.  Urine. It is usually collected by urinating into a germ-free (sterile) specimen cup. It is best to collect the sample the first  time you urinate in the morning. How do I prepare for this test? No preparation is needed for a blood test.  For the urine test:  Let your health care provider know about: ? All medicines you are taking, including vitamins, herbs, creams, and over-the-counter medicines. ? Any blood in your urine. This may interfere with the result.  Do not drink too much fluid. Drink as you normally would, or as directed by your health care provider. How are the results reported? Depending on the type of test that you have, your test results may be reported as values. Your health care provider will compare your results to normal ranges that were established after testing a large group of people (reference ranges). Reference ranges may vary among labs and hospitals. For this test, common reference ranges that show absence of pregnancy are:  Quantitative hCG blood levels: less than 5 IU/L. Other results will be reported as either positive or negative. For this test, normal results (meaning the absence of pregnancy) are:  Negative for hCG in the urine test.  Negative for hCG in the qualitative blood test. What do the results mean? Urine and qualitative blood test  A negative result could mean: ? That you are not pregnant. ? That the test was done too early in your pregnancy to detect hCG in your blood or urine. If you still have other signs   of pregnancy, the test will be repeated.  A positive result means: ? That you are most likely pregnant. Your health care provider may confirm your pregnancy with an imaging study (ultrasound) of your uterus, if needed. Quantitative blood test Results of the quantitative hCG blood test will be interpreted as follows:  Less than 5 IU/L: You are most likely not pregnant.  Greater than 25 IU/L: You are most likely pregnant.  hCG levels that are higher than expected: ? You are pregnant with twins. ? You have abnormal growths in the uterus.  hCG levels that are  rising more slowly than expected: ? You have an ectopic pregnancy (also called a tubal pregnancy).  hCG levels that are falling: ? You may be having a miscarriage. Talk with your health care provider about what your results mean. Questions to ask your health care provider Ask your health care provider, or the department that is doing the test:  When will my results be ready?  How will I get my results?  What are my treatment options?  What other tests do I need?  What are my next steps? Summary  A human chorionic gonadotropin test is done to determine whether you are pregnant.  When you are pregnant, hCG can be detected in your blood or urine 7 to 8 days before your missed period. It continues to go up for the first 8-10 weeks of pregnancy.  Your hCG level can be measured with different types of tests. You may have a urine test, a qualitative blood test, or a quantitative blood test.  Talk with your health care provider about what your results mean. This information is not intended to replace advice given to you by your health care provider. Make sure you discuss any questions you have with your health care provider. Document Released: 07/12/2004 Document Revised: 05/12/2017 Document Reviewed: 05/12/2017 Elsevier Interactive Patient Education  2019 Elsevier Inc. Ectopic Pregnancy  An ectopic pregnancy is when the fertilized egg attaches (implants) outside the uterus. Most ectopic pregnancies occur in one of the tubes where eggs travel from the ovary to the uterus (fallopian tubes), but the implanting can occur in other locations. In rare cases, ectopic pregnancies occur on the ovary, intestine, pelvis, abdomen, or cervix. In an ectopic pregnancy, the fertilized egg does not have the ability to develop into a normal, healthy baby. A ruptured ectopic pregnancy is one in which tearing or bursting of a fallopian tube causes internal bleeding. Often, there is intense lower abdominal  pain, and vaginal bleeding sometimes occurs. Having an ectopic pregnancy can be life-threatening. If this dangerous condition is not treated, it can lead to blood loss, shock, or even death. What are the causes? The most common cause of this condition is damage to one of the fallopian tubes. A fallopian tube may be narrowed or blocked, and that keeps the fertilized egg from reaching the uterus. What increases the risk? This condition is more likely to develop in women of childbearing age who have different levels of risk. The levels of risk can be divided into three categories. High risk  You have gone through infertility treatment.  You have had an ectopic pregnancy before.  You have had surgery on the fallopian tubes, or another surgical procedure, such as an abortion.  You have had surgery to have the fallopian tubes tied (tubal ligation).  You have problems or diseases of the fallopian tubes.  You have been exposed to diethylstilbestrol (DES). This medicine was used until 1971,  and it had effects on babies whose mothers took the medicine.  You become pregnant while using an IUD (intrauterine device) for birth control. Moderate risk  You have a history of infertility.  You have had an STI (sexually transmitted infection).  You have a history of pelvic inflammatory disease (PID).  You have scarring from endometriosis.  You have multiple sexual partners.  You smoke. Low risk  You have had pelvic surgery.  You use vaginal douches.  You became sexually active before age 25. What are the signs or symptoms? Common symptoms of this condition include normal pregnancy symptoms, such as missing a period, nausea, tiredness, abdominal pain, breast tenderness, and bleeding. However, ectopic pregnancy will have additional symptoms, such as:  Pain with intercourse.  Irregular vaginal bleeding or spotting.  Cramping or pain on one side or in the lower abdomen.  Fast heartbeat, low  blood pressure, and sweating.  Passing out while having a bowel movement. Symptoms of a ruptured ectopic pregnancy and internal bleeding may include:  Sudden, severe pain in the abdomen and pelvis.  Dizziness, weakness, light-headedness, or fainting.  Pain in the shoulder or neck area. How is this diagnosed? This condition is diagnosed by:  A pelvic exam to locate pain or a mass in the abdomen.  A pregnancy test. This blood test checks for the presence as well as the specific level of pregnancy hormone in the bloodstream.  Ultrasound. This is performed if a pregnancy test is positive. In this test, a probe is inserted into the vagina. The probe will detect a fetus, possibly in a location other than the uterus.  Taking a sample of uterus tissue (dilation and curettage, or D&C).  Surgery to perform a visual exam of the inside of the abdomen using a thin, lighted tube that has a tiny camera on the end (laparoscope).  Culdocentesis. This procedure involves inserting a needle at the top of the vagina, behind the uterus. If blood is present in this area, it may indicate that a fallopian tube is torn. How is this treated? This condition is treated with medicine or surgery. Medicine  An injection of a medicine (methotrexate) may be given to cause the pregnancy tissue to be absorbed. This medicine may save your fallopian tube. It may be given if: ? The diagnosis is made early, with no signs of active bleeding. ? The fallopian tube has not ruptured. ? You are considered to be a good candidate for the medicine. Usually, pregnancy hormone blood levels are checked after methotrexate treatment. This is to be sure that the medicine is effective. It may take 4-6 weeks for the pregnancy to be absorbed. Most pregnancies will be absorbed by 3 weeks. Surgery  A laparoscope may be used to remove the pregnancy tissue.  If severe internal bleeding occurs, a larger cut (incision) may be made in the  lower abdomen (laparotomy) to remove the fetus and placenta. This is done to stop the bleeding.  Part or all of the fallopian tube may be removed (salpingectomy) along with the fetus and placenta. The fallopian tube may also be repaired during the surgery.  In very rare circumstances, removal of the uterus (hysterectomy) may be required.  After surgery, pregnancy hormone testing may be done to be sure that there is no pregnancy tissue left. Whether your treatment is medicine or surgery, you may receive a Rho (D) immune globulin shot to prevent problems with any future pregnancy. This shot may be given if:  You are Rh-negative  and the baby's father is Rh-positive.  You are Rh-negative and you do not know the Rh type of the baby's father. Follow these instructions at home:  Rest and limit your activity after the procedure for as long as told by your health care provider.  Until your health care provider says that it is safe: ? Do not lift anything that is heavier than 10 lb (4.5 kg), or the limit that your health care provider tells you. ? Avoid physical exercise and any movement that requires effort (is strenuous).  To help prevent constipation: ? Eat a healthy diet that includes fruits, vegetables, and whole grains. ? Drink 6-8 glasses of water per day. Get help right away if:  You develop worsening pain that is not relieved by medicine.  You have: ? A fever or chills. ? Vaginal bleeding. ? Redness and swelling at the incision site. ? Nausea and vomiting.  You feel dizzy or weak.  You feel light-headed or you faint. This information is not intended to replace advice given to you by your health care provider. Make sure you discuss any questions you have with your health care provider. Document Released: 07/18/2004 Document Revised: 02/07/2016 Document Reviewed: 01/10/2016 Elsevier Interactive Patient Education  [employer]2019 Elsevier Inc.

## 2018-07-14 NOTE — MAU Provider Note (Signed)
History     CSN: [id number]  Arrival date and time: 07/14/18 1400   First Provider Initiated Contact with Patient 07/14/18 1718      Chief Complaint  Patient presents with  . Vaginal Bleeding   Cindy [name] is a 23 y.o. G3P1011 at [redacted]w[redacted]d who presents for Vaginal Bleeding.  She reports the bleeding started on Friday and has been on and off until last night.  She states last night she started bleeding, like a period without clots, and has been having some mild (4-5/10) abdominal cramping.  She states the "bleeding is like the end of a period, but it's bright red" and endorses that the flow is light.  However, she has been wearing a pad and states it hasn't been very heavy but is more blood than spotting. The patient states that she has been followed for this issue and had a blood draw on Sunday.  She denies issues with urination, constipation, or diarrhea.         OB History    Gravida  3   Para  1   Term  1   Preterm  0   AB  1   Living  1     SAB  1   TAB  0   Ectopic  0   Multiple  0   Live Births  1           Past Medical History:  Diagnosis Date  . Heart murmur   . Kidney stone     Past Surgical History:  Procedure Laterality Date  . NO PAST SURGERIES      History reviewed. No pertinent family history.  Social History   Tobacco Use  . Smoking status: Never Smoker  . Smokeless tobacco: Never Used  Substance Use Topics  . Alcohol use: No  . Drug use: Yes    Types: Marijuana    Comment: daily use     Allergies:  Allergies  Allergen Reactions  . Acetaminophen Swelling    Causes swelling of the face.    No medications prior to admission.    Review of Systems  Constitutional: Negative for chills and fever.  Gastrointestinal: Negative for abdominal pain (Cramping, but not pain.), constipation, diarrhea, nausea and vomiting.  Genitourinary: Positive for vaginal bleeding. Negative for dysuria and pelvic pain.  Musculoskeletal:  Negative for back pain.  Neurological: Negative for dizziness, light-headedness and headaches.   Physical Exam   Blood pressure (!) 118/50, pulse 68, temperature 98.8 F (37.1 C), temperature source Oral, resp. rate 16, height 5\' 5"  (1.651 m), weight 53.5 kg, last menstrual period 06/01/2018, SpO2 100 %, unknown if currently breastfeeding.  Physical Exam  Constitutional: She is oriented to person, place, and time. She appears well-developed and well-nourished.  HENT:  Head: Normocephalic and atraumatic.  Eyes: Conjunctivae are normal.  Neck: Normal range of motion.  Cardiovascular: Normal rate.  Respiratory: Effort normal.  GI: Soft.  Genitourinary:    Genitourinary Comments: Declined Pelvic Exam   Musculoskeletal: Normal range of motion.  Neurological: She is alert and oriented to person, place, and time.  Skin: Skin is warm and dry.  Psychiatric: She has a normal mood and affect. Her behavior is normal.    MAU Course  Procedures Results for orders placed or performed during the hospital encounter of 07/14/18 (from the past 24 hour(s))  hCG, quantitative, pregnancy     Status: Abnormal   Collection Time: 07/14/18  3:04 PM  Result Value Ref  Range   hCG, Beta Chain, Quant, S 438 (H) <5 mIU/mL    MDM Education  Assessment and Plan  Pregnancy of Unknown Anatomic Location Inappropriate Change in Quantitative hCG in Early Pregnancy   -Patient expressing concerns regarding serial nature of process as she is considering TAB if pregnancy is found to be viable. -Comfort measures given and educated on normal anticipated elevations in quant levels. -Informed that ectopic pregnancy can not be ruled out and this is reason for serial process; Ectopic risks and precautions given. -Q/C addressed including when to expect repeat US. -Pelvic exam offered and declined -Patient scheduled for repeat quant on Friday Jan 24 at 0900. -Bleeding Precautions Given -Encouraged to call or return to  MAU if symptoms worsen or with the onset of new symptoms. -Discharged to home in stable condition  Cherre Robins MSN, CNM 07/14/2018, 5:20 PM

## 2018-07-15 ENCOUNTER — Other Ambulatory Visit: Payer: Medicaid Other

## 2018-07-17 ENCOUNTER — Other Ambulatory Visit (INDEPENDENT_AMBULATORY_CARE_PROVIDER_SITE_OTHER): Payer: Self-pay | Admitting: *Deleted

## 2018-07-17 DIAGNOSIS — O3680X0 Pregnancy with inconclusive fetal viability, not applicable or unspecified: Secondary | ICD-10-CM

## 2018-07-17 LAB — HCG, QUANTITATIVE, PREGNANCY: hCG, Beta Chain, Quant, S: 622 m[IU]/mL — ABNORMAL HIGH (ref <5)

## 2018-07-17 NOTE — Progress Notes (Signed)
Here for stat bhcg. C/o cramping pain=6. C/o bleeding.like a period- but heavy- changing pad every 3 hours- started on 07/14/18 after left MAU.  Explained we will draw stat bhcg and have her wait in lobby for results which we will then discuss with provider and then her. She voices understanding.  LInda,RN Discussed results with Dr. Debroah Loop and ordered repeat stat in 48 hours. Informed patient of her results and recommend repeat stat in 48 hours and to report to mau. Also reviewed ectopic precautions with her. Called MAU to notify patient will be coming on Sunday for stat bhcg.  Linda,RN

## 2018-07-18 NOTE — Progress Notes (Signed)
I have reviewed the chart and agree with nursing staff's documentation of this patient's encounter.  Scheryl Darter, MD 07/18/2018 10:26 AM

## 2019-02-03 ENCOUNTER — Encounter (HOSPITAL_COMMUNITY): Payer: Self-pay | Admitting: Emergency Medicine

## 2019-02-03 ENCOUNTER — Other Ambulatory Visit: Payer: Self-pay

## 2019-02-03 ENCOUNTER — Ambulatory Visit (HOSPITAL_COMMUNITY)
Admission: EM | Admit: 2019-02-03 | Discharge: 2019-02-03 | Disposition: A | Discharge status: Home / Self Care | Payer: Self-pay | Attending: Family Medicine | Admitting: Family Medicine

## 2019-02-03 DIAGNOSIS — H6122 Impacted cerumen, left ear: Secondary | ICD-10-CM

## 2019-02-03 NOTE — ED Triage Notes (Signed)
Pt sts left sided ear pain after cleaning ear; unsure if could have part of qtip in ear

## 2019-02-03 NOTE — ED Provider Notes (Signed)
[place]   [id number] 02/03/19 Arrival Time: 4401  ASSESSMENT & PLAN:  1. Impacted cerumen of left ear     Unsuccessful removal via manual curette. Ears flushed by nursing staff. Pt reports feeling much better; normal hearing; no ear pain. Re-exam of L ear reveals no signs of infection or TM rupture.  May f/u with PCP or here as needed.  Reviewed expectations re: course of current medical issues. Questions answered. Outlined signs and symptoms indicating need for more acute intervention. Patient verbalized understanding. After Visit Summary given.   SUBJECTIVE: History from: patient.  Cindy [name] is a 23 y.o. female who presents with complaint of left otalgia; without drainage; without bleeding. Onset abrupt, today. Using Q-tip; "felt a pain suddenly"; removed Q-tip; continuing discomfort. Also reports decreased hearing on left. Recent cold symptoms: none. Fever: no. Overall normal PO intake without n/v.  OTC treatment: none.  Social History   Tobacco Use  Smoking Status Never Smoker  Smokeless Tobacco Never Used    ROS: As per HPI.   OBJECTIVE:  Vitals:   02/03/19 1816  BP: 111/60  Pulse: 82  Resp: 18  Temp: 99 F (37.2 C)  TempSrc: Oral  SpO2: 100%     General appearance: alert; NAD Ear Canal: cerumen impaction on the left TM: bilateral: normal exam (after flushing) Neck: supple without LAD Lungs: unlabored respirations, symmetrical air entry; no respiratory distress Skin: warm and dry Psychological: alert and cooperative; normal mood and affect  Allergies  Allergen Reactions  . Acetaminophen Swelling    Causes swelling of the face.    Past Medical History:  Diagnosis Date  . Heart murmur   . Kidney stone    Family History  Problem Relation Age of Onset  . Healthy Mother   . Healthy Father    Social History   Socioeconomic History  . Marital status: Single    Spouse name: Not on file  . Number of children: Not on  file  . Years of education: Not on file  . Highest education level: Not on file  Occupational History  . Not on file  Social Needs  . Financial resource strain: Not on file  . Food insecurity    Worry: Not on file    Inability: Not on file  . Transportation needs    Medical: Not on file    Non-medical: Not on file  Tobacco Use  . Smoking status: Never Smoker  . Smokeless tobacco: Never Used  Substance and Sexual Activity  . Alcohol use: No  . Drug use: Yes    Types: Marijuana    Comment: daily use   . Sexual activity: Yes    Birth control/protection: None  Lifestyle  . Physical activity    Days per week: Not on file    Minutes per session: Not on file  . Stress: Not on file  Relationships  . Social Herbalist on phone: Not on file    Gets together: Not on file    Attends religious service: Not on file    Active member of club or organization: Not on file    Attends meetings of clubs or organizations: Not on file    Relationship status: Not on file  . Intimate partner violence    Fear of current or ex partner: Not on file    Emotionally abused: Not on file    Physically abused: Not on file    Forced sexual activity: Not on  file  Other Topics Concern  . Not on file  Social History Narrative  . Not on file            Mardella LaymanHagler, Nylan Nakatani, MD 02/06/19 [phone]

## 2019-03-05 ENCOUNTER — Other Ambulatory Visit: Payer: Self-pay

## 2019-03-05 ENCOUNTER — Encounter (HOSPITAL_COMMUNITY): Payer: Self-pay | Admitting: *Deleted

## 2019-03-05 ENCOUNTER — Inpatient Hospital Stay (HOSPITAL_COMMUNITY)
Admission: AD | Admit: 2019-03-05 | Discharge: 2019-03-05 | Disposition: A | Discharge status: Home / Self Care | Payer: Medicaid Other | Attending: Obstetrics and Gynecology | Admitting: Obstetrics and Gynecology

## 2019-03-05 ENCOUNTER — Inpatient Hospital Stay (HOSPITAL_COMMUNITY): Payer: Medicaid Other

## 2019-03-05 DIAGNOSIS — R109 Unspecified abdominal pain: Secondary | ICD-10-CM | POA: Diagnosis not present

## 2019-03-05 DIAGNOSIS — O418X10 Other specified disorders of amniotic fluid and membranes, first trimester, not applicable or unspecified: Secondary | ICD-10-CM

## 2019-03-05 DIAGNOSIS — O26891 Other specified pregnancy related conditions, first trimester: Secondary | ICD-10-CM

## 2019-03-05 DIAGNOSIS — R1032 Left lower quadrant pain: Secondary | ICD-10-CM | POA: Insufficient documentation

## 2019-03-05 DIAGNOSIS — O418X90 Other specified disorders of amniotic fluid and membranes, unspecified trimester, not applicable or unspecified: Secondary | ICD-10-CM | POA: Diagnosis present

## 2019-03-05 DIAGNOSIS — B9689 Other specified bacterial agents as the cause of diseases classified elsewhere: Secondary | ICD-10-CM | POA: Diagnosis present

## 2019-03-05 DIAGNOSIS — O210 Mild hyperemesis gravidarum: Secondary | ICD-10-CM

## 2019-03-05 DIAGNOSIS — O468X9 Other antepartum hemorrhage, unspecified trimester: Secondary | ICD-10-CM | POA: Diagnosis present

## 2019-03-05 DIAGNOSIS — O26851 Spotting complicating pregnancy, first trimester: Secondary | ICD-10-CM

## 2019-03-05 DIAGNOSIS — Z3A09 9 weeks gestation of pregnancy: Secondary | ICD-10-CM

## 2019-03-05 DIAGNOSIS — O468X1 Other antepartum hemorrhage, first trimester: Secondary | ICD-10-CM | POA: Diagnosis not present

## 2019-03-05 DIAGNOSIS — O200 Threatened abortion: Secondary | ICD-10-CM | POA: Insufficient documentation

## 2019-03-05 DIAGNOSIS — O209 Hemorrhage in early pregnancy, unspecified: Secondary | ICD-10-CM | POA: Diagnosis not present

## 2019-03-05 DIAGNOSIS — N760 Acute vaginitis: Secondary | ICD-10-CM | POA: Diagnosis present

## 2019-03-05 HISTORY — DX: Calculus of kidney: N20.0

## 2019-03-05 HISTORY — DX: Unspecified infectious disease: B99.9

## 2019-03-05 LAB — URINALYSIS, ROUTINE W REFLEX MICROSCOPIC
Bilirubin Urine: NEGATIVE
Glucose, UA: NEGATIVE mg/dL
Hgb urine dipstick: NEGATIVE
Ketones, ur: NEGATIVE mg/dL
Nitrite: NEGATIVE
Protein, ur: NEGATIVE mg/dL
Specific Gravity, Urine: 1.021 (ref 1.005–1.030)
WBC, UA: >50 WBC/hpf — ABNORMAL HIGH (ref 0–5)
pH: 6 (ref 5.0–8.0)

## 2019-03-05 LAB — CBC
HCT: 32.5 % — ABNORMAL LOW (ref 36.0–46.0)
Hemoglobin: 11.1 g/dL — ABNORMAL LOW (ref 12.0–15.0)
MCH: 31.1 pg (ref 26.0–34.0)
MCHC: 34.2 g/dL (ref 30.0–36.0)
MCV: 91 fL (ref 80.0–100.0)
Platelets: 294 10*3/uL (ref 150–400)
RBC: 3.57 MIL/uL — ABNORMAL LOW (ref 3.87–5.11)
RDW: 15.2 % (ref 11.5–15.5)
WBC: 5.5 10*3/uL (ref 4.0–10.5)
nRBC: 0 % (ref 0.0–0.2)

## 2019-03-05 LAB — POCT PREGNANCY, URINE: Preg Test, Ur: POSITIVE — AB

## 2019-03-05 LAB — HCG, QUANTITATIVE, PREGNANCY: hCG, Beta Chain, Quant, S: 100717 m[IU]/mL — ABNORMAL HIGH (ref <5)

## 2019-03-05 LAB — WET PREP, GENITAL
Sperm: NONE SEEN
Trich, Wet Prep: NONE SEEN
Yeast Wet Prep HPF POC: NONE SEEN

## 2019-03-05 MED ORDER — PROMETHAZINE HCL 25 MG PO TABS: 25.0000 mg | ORAL_TABLET | Freq: Four times a day (QID) | ORAL | 3 refills | Status: DC | PRN

## 2019-03-05 MED ORDER — METRONIDAZOLE 0.75 % VA GEL: 1.0000 | Freq: Every day | VAGINAL | 0 refills | Status: AC

## 2019-03-05 MED ORDER — OMEPRAZOLE 20 MG PO CPDR: 20.0000 mg | DELAYED_RELEASE_CAPSULE | Freq: Every day | ORAL | 3 refills | Status: DC

## 2019-03-05 NOTE — MAU Note (Signed)
Started cramping real bad earlier, worse when sitting.  +HPT mid Aug.  Started spotting like 2 days ago, then it went away, then started cramping.

## 2019-03-05 NOTE — Discharge Instructions (Signed)

## 2019-03-05 NOTE — MAU Provider Note (Addendum)
History     CSN: [id number]  Arrival date and time: 03/05/19 1151   First Provider Initiated Contact with Patient 03/05/19 1251      Chief Complaint  Patient presents with  . Abdominal Pain  . Possible Pregnancy   Matalynn Claes is a 23 yr old G60P1021 female at [redacted]w[redacted]d who presents to the MAU today for cramping in the LLQ starting last night. She first noticed the cramping and pain when she was having a bowel movement, and the cramping continued any time she was sitting down. She feels that the cramping is similar to her previous miscarriages, but not as severe and she had much more bleeding with her prior miscarriages. She had light bleeding two days ago, for which she only had to wear a panty liner. No clots or tissue. Some slight increase in clear discharge. Denies leakage of fluid or contractions. Denies history of STIs or PID. She last had sexual intercourse yesterday morning. No new partners.   She has not been taking prenatal vitamins and has not yet had a prenatal visit.    OB History    Gravida  4   Para  1   Term  1   Preterm  0   AB  2   Living  1     SAB  2   TAB  0   Ectopic  0   Multiple  0   Live Births  1           Past Medical History:  Diagnosis Date  . Heart murmur   . Infection    UTI  . Kidney stone   . Kidney stones     Past Surgical History:  Procedure Laterality Date  . NO PAST SURGERIES      Family History  Problem Relation Age of Onset  . Healthy Mother   . Healthy Father     Social History   Tobacco Use  . Smoking status: Never Smoker  . Smokeless tobacco: Never Used  Substance Use Topics  . Alcohol use: No  . Drug use: Not Currently    Types: Marijuana    Comment: last was end of Aug    Allergies:  Allergies  Allergen Reactions  . Acetaminophen Anaphylaxis and Swelling    Throat swells, couldn't breath.    No medications prior to admission.    Review of Systems  Constitutional: Positive for appetite  change and chills. Negative for fever.  Respiratory: Negative for shortness of breath.   Cardiovascular: Negative for chest pain.  Gastrointestinal: Positive for abdominal pain, nausea and vomiting. Negative for abdominal distention, blood in stool, constipation and diarrhea.  Genitourinary: Positive for frequency, vaginal bleeding and vaginal discharge. Negative for decreased urine volume, difficulty urinating, dysuria, urgency and vaginal pain.  Neurological: Negative for dizziness, syncope, light-headedness and headaches.   Physical Exam   Blood pressure (!) 96/57, pulse 82, temperature 99.1 F (37.3 C), temperature source Oral, resp. rate 16, last menstrual period 12/29/2018, SpO2 98 %, unknown if currently breastfeeding.  Physical Exam  Constitutional: She is oriented to person, place, and time. She appears well-developed and well-nourished. No distress.  HENT:  Head: Normocephalic and atraumatic.  Cardiovascular: Normal rate, regular rhythm and normal heart sounds.  Respiratory: Effort normal and breath sounds normal. No respiratory distress. She has no wheezes.  GI: Soft. Bowel sounds are normal. She exhibits no distension and no mass. There is no abdominal tenderness. There is no rebound and no guarding.  Genitourinary: There is no rash, tenderness, lesion or injury on the right labia. There is no rash, tenderness, lesion or injury on the left labia. Cervix exhibits discharge (yellow-ish discharge from cervical os). Cervix exhibits no motion tenderness and no friability. Right adnexum displays no tenderness. Left adnexum displays tenderness.    Vaginal discharge present.     No vaginal erythema, tenderness or bleeding.  No erythema, tenderness or bleeding in the vagina.  Neurological: She is alert and oriented to person, place, and time.  Skin: Skin is warm and dry. She is not diaphoretic.  Psychiatric: She has a normal mood and affect. Her behavior is normal. Judgment and thought  content normal.    MAU Course  Procedures  MDM Urine pregnancy test: positive UA: hazy, moderate leukocytes, >50 WBC, rare bacteria, mucus present Urine culture: pending CBC: Hgb 11.1, Hct 32.5 Beta-hCG: 100,717 Wet prep: Clue cells present, many WBC GC/chlamydia: pending HIV antibody: pending Transvaginal US: yolk sac not visualized, embryo visualized, subchorionic hemorrhage IMPRESSION: 1. Single live intrauterine gestation with estimated gestational age of 13+ weeks.  2.  Subchorionic hemorrhage measuring 3.8 x 1.2 cm.  3. Left ovarian cyst measuring 4.8 x 4.6 x 4.7 cm. Suspect enlarged corpus luteum. No other extrauterine pelvic mass.  4.  Small amount of maternal free pelvic fluid.  Results for orders placed or performed during the hospital encounter of 03/05/19 (from the past 24 hour(s))  Urinalysis, Routine w reflex microscopic     Status: Abnormal   Collection Time: 03/05/19 12:25 PM  Result Value Ref Range   Color, Urine YELLOW YELLOW   APPearance HAZY (A) CLEAR   Specific Gravity, Urine 1.021 1.005 - 1.030   pH 6.0 5.0 - 8.0   Glucose, UA NEGATIVE NEGATIVE mg/dL   Hgb urine dipstick NEGATIVE NEGATIVE   Bilirubin Urine NEGATIVE NEGATIVE   Ketones, ur NEGATIVE NEGATIVE mg/dL   Protein, ur NEGATIVE NEGATIVE mg/dL   Nitrite NEGATIVE NEGATIVE   Leukocytes,Ua MODERATE (A) NEGATIVE   RBC / HPF 0-5 0 - 5 RBC/hpf   WBC, UA >50 (H) 0 - 5 WBC/hpf   Bacteria, UA RARE (A) NONE SEEN   Squamous Epithelial / LPF 6-10 0 - 5   Mucus PRESENT   Pregnancy, urine POC     Status: Abnormal   Collection Time: 03/05/19 12:28 PM  Result Value Ref Range   Preg Test, Ur POSITIVE (A) NEGATIVE  CBC     Status: Abnormal   Collection Time: 03/05/19  1:35 PM  Result Value Ref Range   WBC 5.5 4.0 - 10.5 K/uL   RBC 3.57 (L) 3.87 - 5.11 MIL/uL   Hemoglobin 11.1 (L) 12.0 - 15.0 g/dL   HCT 32.5 (L) 36.0 - 46.0 %   MCV 91.0 80.0 - 100.0 fL   MCH 31.1 26.0 - 34.0 pg   MCHC 34.2 30.0 -  36.0 g/dL   RDW 15.2 11.5 - 15.5 %   Platelets 294 150 - 400 K/uL   nRBC 0.0 0.0 - 0.2 %  hCG, quantitative, pregnancy     Status: Abnormal   Collection Time: 03/05/19  1:35 PM  Result Value Ref Range   hCG, Beta Chain, Quant, S 100,717 (H) <5 mIU/mL  Wet prep, genital     Status: Abnormal   Collection Time: 03/05/19  1:57 PM   Specimen: Cervical/Vaginal swab  Result Value Ref Range   Yeast Wet Prep HPF POC NONE SEEN NONE SEEN   Trich, Wet Prep NONE SEEN NONE  SEEN   Clue Cells Wet Prep HPF POC PRESENT (A) NONE SEEN   WBC, Wet Prep HPF POC MANY (A) NONE SEEN   Sperm NONE SEEN      Assessment and Plan  Theresa DutyRashawna Kina is a 23 yr old 34P1021 female at 1233w3d who presents to the MAU today for cramping in the LLQ starting last night and light bleeding that occurred two days ago, concerning for miscarriage, ectopic pregnancy, UTI, or STI. Wet prep showed clue cells. GC/chlamydia pending. Ectopic work-up is negative and threatened abortion was ruled out. Blood type A positive. Transvaginal US shows IUP at 9+ weeks gestational age; embryo visualized, yolk sac not visualized. She has moderate leukocytes on UA; pending urine culture. Will treat for BV with metrogel, due to the risk of miscarriage in early pregnancy with untreated BV. Discussed treatment with pt. Discussed subchorionic hemorrhage and return precautions with pt.    Osvaldo Shipperiffany  K Gibson 03/05/2019, 1:14 PM   I confirm that I have verified the information documented in the medical student's note and that I have also personally reperformed the history, physical exam and all medical decision making activities of this service and have verified that all service and findings are accurately documented in this student's note.   [facility], abd pain in preg, threatened miscarriage. [facility] information given and advised to call to schedule appt in 2-3 weeks. Patient verbalized an understanding of  the plan of care and agrees.   Raelyn MoraDawson, Rhaelyn Giron, CNM 03/05/2019 8:04 PM

## 2019-03-06 LAB — CULTURE, OB URINE: Special Requests: NORMAL

## 2019-03-06 LAB — GC/CHLAMYDIA PROBE AMP (~~LOC~~) NOT AT ARMC
Chlamydia: POSITIVE — AB
Neisseria Gonorrhea: POSITIVE — AB

## 2019-03-06 LAB — HIV ANTIBODY (ROUTINE TESTING W REFLEX): HIV Screen 4th Generation wRfx: NONREACTIVE

## 2019-03-15 ENCOUNTER — Ambulatory Visit (INDEPENDENT_AMBULATORY_CARE_PROVIDER_SITE_OTHER): Payer: Medicaid Other

## 2019-03-15 ENCOUNTER — Other Ambulatory Visit: Payer: Self-pay

## 2019-03-15 DIAGNOSIS — A549 Gonococcal infection, unspecified: Secondary | ICD-10-CM | POA: Diagnosis not present

## 2019-03-15 MED ORDER — CEFTRIAXONE SODIUM 250 MG IJ SOLR
250.0000 mg | Freq: Once | INTRAMUSCULAR | Status: AC
  Administered 2019-03-15: 16:00:00 250 mg via INTRAMUSCULAR

## 2019-03-15 MED ORDER — AZITHROMYCIN 250 MG PO TABS
1000.0000 mg | ORAL_TABLET | Freq: Once | ORAL | Status: AC
  Administered 2019-03-15: 1000 mg via ORAL

## 2019-03-15 NOTE — Progress Notes (Signed)
Pt here today for tx for testing positive for GC/CH.    Rocephin 250  mg given and tolerated well.  Zithromax 1000 mg given orally.  Pt tolerated well.  Pt advised to make sure that partner(s) are treated as well and to wait two weeks to have intercourse after both have been treated. Pt advised to schedule a TOC appt in 4-5 weeks and to start OB care of her chose.  Pt verbalized understanding.    Mel Almond, RN 03/15/19

## 2019-03-16 NOTE — Progress Notes (Signed)
Patient seen and assessed by nursing staff during this encounter. I have reviewed the chart and agree with the documentation and plan.  Marcille Buffy DNP, CNM  03/16/19  8:55 AM

## 2019-04-12 DIAGNOSIS — Z1388 Encounter for screening for disorder due to exposure to contaminants: Secondary | ICD-10-CM | POA: Diagnosis not present

## 2019-04-12 DIAGNOSIS — Z3009 Encounter for other general counseling and advice on contraception: Secondary | ICD-10-CM | POA: Diagnosis not present

## 2019-04-12 DIAGNOSIS — O219 Vomiting of pregnancy, unspecified: Secondary | ICD-10-CM | POA: Diagnosis not present

## 2019-04-12 DIAGNOSIS — O99019 Anemia complicating pregnancy, unspecified trimester: Secondary | ICD-10-CM | POA: Diagnosis not present

## 2019-04-12 DIAGNOSIS — Z789 Other specified health status: Secondary | ICD-10-CM | POA: Diagnosis not present

## 2019-04-12 DIAGNOSIS — N83202 Unspecified ovarian cyst, left side: Secondary | ICD-10-CM | POA: Diagnosis not present

## 2019-04-12 DIAGNOSIS — R824 Acetonuria: Secondary | ICD-10-CM | POA: Diagnosis not present

## 2019-04-12 DIAGNOSIS — Z3482 Encounter for supervision of other normal pregnancy, second trimester: Secondary | ICD-10-CM | POA: Diagnosis not present

## 2019-04-12 DIAGNOSIS — O98211 Gonorrhea complicating pregnancy, first trimester: Secondary | ICD-10-CM | POA: Diagnosis not present

## 2019-04-12 DIAGNOSIS — O0910 Supervision of pregnancy with history of ectopic or molar pregnancy, unspecified trimester: Secondary | ICD-10-CM | POA: Diagnosis not present

## 2019-04-12 DIAGNOSIS — N760 Acute vaginitis: Secondary | ICD-10-CM | POA: Diagnosis not present

## 2019-04-12 DIAGNOSIS — A609 Anogenital herpesviral infection, unspecified: Secondary | ICD-10-CM | POA: Diagnosis not present

## 2019-04-12 DIAGNOSIS — Z0389 Encounter for observation for other suspected diseases and conditions ruled out: Secondary | ICD-10-CM | POA: Diagnosis not present

## 2019-04-12 DIAGNOSIS — A5600 Chlamydial infection of lower genitourinary tract, unspecified: Secondary | ICD-10-CM | POA: Diagnosis not present

## 2019-04-12 DIAGNOSIS — O208 Other hemorrhage in early pregnancy: Secondary | ICD-10-CM | POA: Diagnosis not present

## 2019-05-19 ENCOUNTER — Encounter (HOSPITAL_COMMUNITY): Payer: Self-pay

## 2019-05-19 ENCOUNTER — Other Ambulatory Visit: Payer: Self-pay

## 2019-05-19 ENCOUNTER — Inpatient Hospital Stay (HOSPITAL_COMMUNITY)
Admission: AD | Admit: 2019-05-19 | Discharge: 2019-05-19 | Disposition: A | Discharge status: Home / Self Care | Payer: Medicaid Other | Attending: Obstetrics and Gynecology | Admitting: Obstetrics and Gynecology

## 2019-05-19 DIAGNOSIS — Z79899 Other long term (current) drug therapy: Secondary | ICD-10-CM | POA: Insufficient documentation

## 2019-05-19 DIAGNOSIS — Z3A20 20 weeks gestation of pregnancy: Secondary | ICD-10-CM | POA: Insufficient documentation

## 2019-05-19 DIAGNOSIS — O368120 Decreased fetal movements, second trimester, not applicable or unspecified: Secondary | ICD-10-CM | POA: Diagnosis not present

## 2019-05-19 NOTE — Discharge Instructions (Signed)

## 2019-05-19 NOTE — MAU Provider Note (Signed)
History     CSN: [id number]  Arrival date and time: 05/19/19 1843   First Provider Initiated Contact with Patient 05/19/19 1910      Chief Complaint  Patient presents with  . Decreased Fetal Movement   23 y.o. G3O7564 @20 .1 wks presenting for decreased FM x2-3 days. Denies VB or abd pain. Feels well. No other c/o.   OB History    Gravida  4   Para  1   Term  1   Preterm  0   AB  2   Living  1     SAB  2   TAB  0   Ectopic  0   Multiple  0   Live Births  1           Past Medical History:  Diagnosis Date  . Heart murmur   . Infection    UTI  . Kidney stone   . Kidney stones     Past Surgical History:  Procedure Laterality Date  . NO PAST SURGERIES      Family History  Problem Relation Age of Onset  . Healthy Mother   . Healthy Father     Social History   Tobacco Use  . Smoking status: Never Smoker  . Smokeless tobacco: Never Used  Substance Use Topics  . Alcohol use: No  . Drug use: Yes    Types: Marijuana    Comment: last was end of Aug    Allergies:  Allergies  Allergen Reactions  . Acetaminophen Anaphylaxis and Swelling    Throat swells, couldn't breath.    Medications Prior to Admission  Medication Sig Dispense Refill Last Dose  . Prenatal Vit-Fe Fumarate-FA (MULTIVITAMIN-PRENATAL) 27-0.8 MG TABS tablet Take 1 tablet by mouth daily at 12 noon.   Past Month at Unknown time  . omeprazole (PRILOSEC) 20 MG capsule Take 1 capsule (20 mg total) by mouth daily. 30 capsule 3   . promethazine (PHENERGAN) 25 MG tablet Take 1 tablet (25 mg total) by mouth every 6 (six) hours as needed for nausea or vomiting. May insert vaginally, if unable to keep anything down 30 tablet 3 More than a month at Unknown time    Review of Systems  Gastrointestinal: Negative for abdominal pain.  Genitourinary: Negative for vaginal bleeding.   Physical Exam   Blood pressure (!) 113/55, pulse 82, temperature 98.3 F (36.8 C), temperature source Oral,  resp. rate 16, weight 54.5 kg, last menstrual period 12/29/2018, SpO2 100 %, unknown if currently breastfeeding.  Physical Exam  Nursing note and vitals reviewed. Constitutional: She is oriented to person, place, and time. She appears well-developed and well-nourished. No distress.  HENT:  Head: Normocephalic and atraumatic.  Neck: Normal range of motion.  Cardiovascular: Normal rate.  Respiratory: Effort normal. No respiratory distress.  Musculoskeletal: Normal range of motion.  Neurological: She is alert and oriented to person, place, and time.  Psychiatric: She has a normal mood and affect.  FHT 150  MAU Course  Procedures  MDM Normal FHT, pt reassured. Informed FM may not be consistent at this Noonday. Stable for discharge home.   Assessment and Plan   1. [redacted] weeks gestation of pregnancy    Discharge home Follow up at [facility] as scheduled Return precautions  Allergies as of 05/19/2019      Reactions   Acetaminophen Anaphylaxis, Swelling   Throat swells, couldn't breath.      Medication List    TAKE these medications   multivitamin-prenatal  27-0.8 MG Tabs tablet Take 1 tablet by mouth daily at 12 noon.   omeprazole 20 MG capsule Commonly known as: PRILOSEC Take 1 capsule (20 mg total) by mouth daily.   promethazine 25 MG tablet Commonly known as: PHENERGAN Take 1 tablet (25 mg total) by mouth every 6 (six) hours as needed for nausea or vomiting. May insert vaginally, if unable to keep anything down      Donette Larry, CNM 05/19/2019, 7:18 PM

## 2019-05-19 NOTE — MAU Note (Signed)
Pt states that she has not felt any fetal movement for 2-3 days. Pt reports that she was feeling very strong movements.   Pt reports she had a blood clot in her placenta and has not had any follow up. Pt is wanting to make sure that is gone today.   Denies vaginal bleeding, denies LOF.

## 2019-06-03 DIAGNOSIS — O99212 Obesity complicating pregnancy, second trimester: Secondary | ICD-10-CM | POA: Diagnosis not present

## 2019-06-25 NOTE — L&D Delivery Note (Addendum)
OB/GYN Faculty Practice Delivery Note  Cindy [name] is a 24 y.o. O2D7412 s/p VD at [redacted]w[redacted]d. She was admitted for IOL for PreE with severe features and IUGR <1%tile.   ROM: 1h 30m with clear fluid GBS Status: Unknown   - PCN x2 given prior to delivery  Labor Progress: Patient presented to MAU for evalaution for PreE with severe features and IUGR <1%tile. Initial SVE: 3/70/-1. While in [facility] specialty care she had several severe-range blood pressures and was transferred to L&D for induction. She was given pitocin, magnesium and after epidural placement had AROM at 2224. She then progressed to complete.   Delivery Date/Time: 09/10/2019; 0001 Delivery: Called to room and patient was complete and pushing. Head delivered ROA. Nuchal cord was present and delivered through. Shoulder and body delivered in usual fashion. Infant with spontaneous cry, placed on mother's abdomen, dried and stimulated. Cord clamped x 2 after 1-minute delay, and cut by FOB under direct supervision of Dr. Salomon Mast. Cord gas attempted, and cord blood drawn. Placenta delivered spontaneously with gentle cord traction. Fundus firm with massage and Pitocin. Labia, perineum, vagina, and cervix inspected inspected showing no lacerations. 1000mg  cytotec PR given prophylactically due to increased lochia with fundal rub.  Neonatology present for delivery.  Baby Weight: Per chart  Placenta: Sent to Pathology for IUGR, 3 vessel cord (thin), placenta intact Complications: None Lacerations: None EBL: 389 mL Analgesia: Epidural   Infant: APGAR (1 MIN): 8   APGAR (5 MINS):  8  , DO 09/10/2019, 12:32 AM  GME ATTESTATION:  I saw and evaluated the patient. I delivered the infant, and was gowned and gloved to supervise the resident for the rest of the delivery. I agree with the findings and the plan of care as documented in the resident's note.  09/12/2019, DO OB Fellow, Faculty [facility], Center for  [facility] 09/10/2019 12:43 AM

## 2019-07-19 DIAGNOSIS — B373 Candidiasis of vulva and vagina: Secondary | ICD-10-CM | POA: Diagnosis not present

## 2019-07-19 DIAGNOSIS — O283 Abnormal ultrasonic finding on antenatal screening of mother: Secondary | ICD-10-CM | POA: Diagnosis not present

## 2019-07-19 DIAGNOSIS — Z789 Other specified health status: Secondary | ICD-10-CM | POA: Diagnosis not present

## 2019-07-19 DIAGNOSIS — O219 Vomiting of pregnancy, unspecified: Secondary | ICD-10-CM | POA: Diagnosis not present

## 2019-07-19 DIAGNOSIS — O208 Other hemorrhage in early pregnancy: Secondary | ICD-10-CM | POA: Diagnosis not present

## 2019-07-19 DIAGNOSIS — O99019 Anemia complicating pregnancy, unspecified trimester: Secondary | ICD-10-CM | POA: Diagnosis not present

## 2019-07-19 DIAGNOSIS — Z0389 Encounter for observation for other suspected diseases and conditions ruled out: Secondary | ICD-10-CM | POA: Diagnosis not present

## 2019-07-19 DIAGNOSIS — N83202 Unspecified ovarian cyst, left side: Secondary | ICD-10-CM | POA: Diagnosis not present

## 2019-07-19 DIAGNOSIS — A609 Anogenital herpesviral infection, unspecified: Secondary | ICD-10-CM | POA: Diagnosis not present

## 2019-07-19 DIAGNOSIS — Z3009 Encounter for other general counseling and advice on contraception: Secondary | ICD-10-CM | POA: Diagnosis not present

## 2019-07-19 DIAGNOSIS — A5600 Chlamydial infection of lower genitourinary tract, unspecified: Secondary | ICD-10-CM | POA: Diagnosis not present

## 2019-07-19 DIAGNOSIS — N760 Acute vaginitis: Secondary | ICD-10-CM | POA: Diagnosis not present

## 2019-07-19 DIAGNOSIS — Z3483 Encounter for supervision of other normal pregnancy, third trimester: Secondary | ICD-10-CM | POA: Diagnosis not present

## 2019-07-19 DIAGNOSIS — O0910 Supervision of pregnancy with history of ectopic or molar pregnancy, unspecified trimester: Secondary | ICD-10-CM | POA: Diagnosis not present

## 2019-07-19 DIAGNOSIS — O98211 Gonorrhea complicating pregnancy, first trimester: Secondary | ICD-10-CM | POA: Diagnosis not present

## 2019-07-19 DIAGNOSIS — Z1388 Encounter for screening for disorder due to exposure to contaminants: Secondary | ICD-10-CM | POA: Diagnosis not present

## 2019-09-09 ENCOUNTER — Other Ambulatory Visit: Payer: Self-pay

## 2019-09-09 ENCOUNTER — Encounter (HOSPITAL_COMMUNITY): Payer: Self-pay | Admitting: Obstetrics & Gynecology

## 2019-09-09 ENCOUNTER — Inpatient Hospital Stay (HOSPITAL_COMMUNITY): Payer: Medicaid Other | Admitting: Anesthesiology

## 2019-09-09 ENCOUNTER — Inpatient Hospital Stay (HOSPITAL_BASED_OUTPATIENT_CLINIC_OR_DEPARTMENT_OTHER): Payer: Medicaid Other

## 2019-09-09 ENCOUNTER — Inpatient Hospital Stay (HOSPITAL_COMMUNITY)
Admission: AD | Admit: 2019-09-09 | Discharge: 2019-09-12 | DRG: 807 | Disposition: A | Discharge status: Home / Self Care | Payer: Medicaid Other | Attending: Obstetrics and Gynecology | Admitting: Obstetrics and Gynecology

## 2019-09-09 DIAGNOSIS — N760 Acute vaginitis: Secondary | ICD-10-CM

## 2019-09-09 DIAGNOSIS — O210 Mild hyperemesis gravidarum: Secondary | ICD-10-CM

## 2019-09-09 DIAGNOSIS — O9081 Anemia of the puerperium: Secondary | ICD-10-CM | POA: Diagnosis not present

## 2019-09-09 DIAGNOSIS — Z3A36 36 weeks gestation of pregnancy: Secondary | ICD-10-CM

## 2019-09-09 DIAGNOSIS — Z363 Encounter for antenatal screening for malformations: Secondary | ICD-10-CM

## 2019-09-09 DIAGNOSIS — O9952 Diseases of the respiratory system complicating childbirth: Secondary | ICD-10-CM | POA: Diagnosis present

## 2019-09-09 DIAGNOSIS — F1210 Cannabis abuse, uncomplicated: Secondary | ICD-10-CM | POA: Diagnosis present

## 2019-09-09 DIAGNOSIS — O99324 Drug use complicating childbirth: Secondary | ICD-10-CM | POA: Diagnosis present

## 2019-09-09 DIAGNOSIS — O9902 Anemia complicating childbirth: Secondary | ICD-10-CM | POA: Diagnosis not present

## 2019-09-09 DIAGNOSIS — O99323 Drug use complicating pregnancy, third trimester: Secondary | ICD-10-CM

## 2019-09-09 DIAGNOSIS — O1414 Severe pre-eclampsia complicating childbirth: Principal | ICD-10-CM | POA: Diagnosis present

## 2019-09-09 DIAGNOSIS — J45909 Unspecified asthma, uncomplicated: Secondary | ICD-10-CM | POA: Diagnosis present

## 2019-09-09 DIAGNOSIS — O365990 Maternal care for other known or suspected poor fetal growth, unspecified trimester, not applicable or unspecified: Secondary | ICD-10-CM | POA: Diagnosis present

## 2019-09-09 DIAGNOSIS — O365930 Maternal care for other known or suspected poor fetal growth, third trimester, not applicable or unspecified: Secondary | ICD-10-CM

## 2019-09-09 DIAGNOSIS — B9689 Other specified bacterial agents as the cause of diseases classified elsewhere: Secondary | ICD-10-CM

## 2019-09-09 DIAGNOSIS — O418X10 Other specified disorders of amniotic fluid and membranes, first trimester, not applicable or unspecified: Secondary | ICD-10-CM

## 2019-09-09 DIAGNOSIS — O1413 Severe pre-eclampsia, third trimester: Secondary | ICD-10-CM

## 2019-09-09 DIAGNOSIS — O1002 Pre-existing essential hypertension complicating childbirth: Secondary | ICD-10-CM | POA: Diagnosis not present

## 2019-09-09 DIAGNOSIS — Z20822 Contact with and (suspected) exposure to covid-19: Secondary | ICD-10-CM | POA: Diagnosis present

## 2019-09-09 DIAGNOSIS — Z3483 Encounter for supervision of other normal pregnancy, third trimester: Secondary | ICD-10-CM | POA: Diagnosis not present

## 2019-09-09 DIAGNOSIS — R030 Elevated blood-pressure reading, without diagnosis of hypertension: Secondary | ICD-10-CM | POA: Diagnosis present

## 2019-09-09 DIAGNOSIS — D649 Anemia, unspecified: Secondary | ICD-10-CM | POA: Diagnosis not present

## 2019-09-09 DIAGNOSIS — O133 Gestational [pregnancy-induced] hypertension without significant proteinuria, third trimester: Secondary | ICD-10-CM

## 2019-09-09 DIAGNOSIS — F1290 Cannabis use, unspecified, uncomplicated: Secondary | ICD-10-CM

## 2019-09-09 HISTORY — DX: Unspecified asthma, uncomplicated: J45.909

## 2019-09-09 LAB — COMPREHENSIVE METABOLIC PANEL
ALT: 13 U/L (ref 0–44)
AST: 16 U/L (ref 15–41)
Albumin: 2.6 g/dL — ABNORMAL LOW (ref 3.5–5.0)
Alkaline Phosphatase: 163 U/L — ABNORMAL HIGH (ref 38–126)
Anion gap: 10 (ref 5–15)
BUN: 7 mg/dL (ref 6–20)
CO2: 22 mmol/L (ref 22–32)
Calcium: 8.8 mg/dL — ABNORMAL LOW (ref 8.9–10.3)
Chloride: 106 mmol/L (ref 98–111)
Creatinine, Ser: 0.83 mg/dL (ref 0.44–1.00)
GFR calc Af Amer: >60 mL/min (ref >60)
GFR calc non Af Amer: >60 mL/min (ref >60)
Glucose, Bld: 80 mg/dL (ref 70–99)
Potassium: 4.4 mmol/L (ref 3.5–5.1)
Sodium: 138 mmol/L (ref 135–145)
Total Bilirubin: 0.6 mg/dL (ref 0.3–1.2)
Total Protein: 5.7 g/dL — ABNORMAL LOW (ref 6.5–8.1)

## 2019-09-09 LAB — CBC
HCT: 30.1 % — ABNORMAL LOW (ref 36.0–46.0)
HCT: 31.2 % — ABNORMAL LOW (ref 36.0–46.0)
Hemoglobin: 10.1 g/dL — ABNORMAL LOW (ref 12.0–15.0)
Hemoglobin: 10.2 g/dL — ABNORMAL LOW (ref 12.0–15.0)
MCH: 30.7 pg (ref 26.0–34.0)
MCH: 30.4 pg (ref 26.0–34.0)
MCHC: 33.6 g/dL (ref 30.0–36.0)
MCHC: 32.7 g/dL (ref 30.0–36.0)
MCV: 91.5 fL (ref 80.0–100.0)
MCV: 93.1 fL (ref 80.0–100.0)
Platelets: 244 10*3/uL (ref 150–400)
Platelets: 261 10*3/uL (ref 150–400)
RBC: 3.29 MIL/uL — ABNORMAL LOW (ref 3.87–5.11)
RBC: 3.35 MIL/uL — ABNORMAL LOW (ref 3.87–5.11)
RDW: 13.4 % (ref 11.5–15.5)
RDW: 13.4 % (ref 11.5–15.5)
WBC: 8 10*3/uL (ref 4.0–10.5)
WBC: 8.9 10*3/uL (ref 4.0–10.5)
nRBC: 0 % (ref 0.0–0.2)
nRBC: 0 % (ref 0.0–0.2)

## 2019-09-09 LAB — URINALYSIS, ROUTINE W REFLEX MICROSCOPIC
Bacteria, UA: NONE SEEN
Bilirubin Urine: NEGATIVE
Glucose, UA: NEGATIVE mg/dL
Hgb urine dipstick: NEGATIVE
Ketones, ur: NEGATIVE mg/dL
Leukocytes,Ua: NEGATIVE
Nitrite: NEGATIVE
Protein, ur: 100 mg/dL — AB
Specific Gravity, Urine: 1.025 (ref 1.005–1.030)
pH: 6 (ref 5.0–8.0)

## 2019-09-09 LAB — TYPE AND SCREEN
ABO/RH(D): A POS
Antibody Screen: NEGATIVE

## 2019-09-09 LAB — PROTEIN / CREATININE RATIO, URINE
Creatinine, Urine: 272.14 mg/dL
Protein Creatinine Ratio: 0.27 mg/mg{Cre} — ABNORMAL HIGH (ref 0.00–0.15)
Total Protein, Urine: 74 mg/dL

## 2019-09-09 LAB — ABO/RH: ABO/RH(D): A POS

## 2019-09-09 MED ORDER — CALCIUM CARBONATE ANTACID 500 MG PO CHEW: 2.0000 | CHEWABLE_TABLET | ORAL | Status: DC | PRN

## 2019-09-09 MED ORDER — PENICILLIN G POT IN DEXTROSE 60000 UNIT/ML IV SOLN
3.0000 10*6.[IU] | INTRAVENOUS | Status: DC
  Administered 2019-09-09: 3 10*6.[IU] via INTRAVENOUS
  Filled 2019-09-09: qty 50

## 2019-09-09 MED ORDER — LABETALOL HCL 5 MG/ML IV SOLN: 20.0000 mg | INTRAVENOUS | Status: DC | PRN

## 2019-09-09 MED ORDER — LACTATED RINGERS IV SOLN: INTRAVENOUS | Status: DC

## 2019-09-09 MED ORDER — LABETALOL HCL 5 MG/ML IV SOLN: 40.0000 mg | INTRAVENOUS | Status: DC | PRN

## 2019-09-09 MED ORDER — SODIUM CHLORIDE 0.9 % IV SOLN
5.0000 10*6.[IU] | Freq: Once | INTRAVENOUS | Status: AC
  Administered 2019-09-09: 5 10*6.[IU] via INTRAVENOUS
  Filled 2019-09-09: qty 5

## 2019-09-09 MED ORDER — OXYTOCIN 40 UNITS IN NORMAL SALINE INFUSION - SIMPLE MED
1.0000 m[IU]/min | INTRAVENOUS | Status: DC
  Administered 2019-09-09: 2 m[IU]/min via INTRAVENOUS
  Administered 2019-09-09: 6 m[IU]/min via INTRAVENOUS
  Filled 2019-09-09: qty 1000

## 2019-09-09 MED ORDER — FENTANYL CITRATE (PF) 100 MCG/2ML IJ SOLN: 50.0000 ug | INTRAMUSCULAR | Status: DC | PRN

## 2019-09-09 MED ORDER — LACTATED RINGERS IV SOLN: 500.0000 mL | Freq: Once | INTRAVENOUS | Status: DC

## 2019-09-09 MED ORDER — LIDOCAINE HCL (PF) 1 % IJ SOLN
INTRAMUSCULAR | Status: DC | PRN
  Administered 2019-09-09 (×2): 4 mL via EPIDURAL

## 2019-09-09 MED ORDER — SOD CITRATE-CITRIC ACID 500-334 MG/5ML PO SOLN: 30.0000 mL | ORAL | Status: DC | PRN

## 2019-09-09 MED ORDER — LACTATED RINGERS IV SOLN: 500.0000 mL | INTRAVENOUS | Status: DC | PRN

## 2019-09-09 MED ORDER — LACTATED RINGERS IV SOLN
500.0000 mL | Freq: Once | INTRAVENOUS | Status: AC
  Administered 2019-09-09: 500 mL via INTRAVENOUS

## 2019-09-09 MED ORDER — PHENYLEPHRINE 40 MCG/ML (10ML) SYRINGE FOR IV PUSH (FOR BLOOD PRESSURE SUPPORT): 80.0000 ug | PREFILLED_SYRINGE | INTRAVENOUS | Status: DC | PRN

## 2019-09-09 MED ORDER — LABETALOL HCL 5 MG/ML IV SOLN: 80.0000 mg | INTRAVENOUS | Status: DC | PRN

## 2019-09-09 MED ORDER — LABETALOL HCL 5 MG/ML IV SOLN
20.0000 mg | INTRAVENOUS | Status: DC | PRN
  Administered 2019-09-09: 20 mg via INTRAVENOUS

## 2019-09-09 MED ORDER — EPHEDRINE 5 MG/ML INJ
10.0000 mg | INTRAVENOUS | Status: DC | PRN
  Filled 2019-09-09: qty 10

## 2019-09-09 MED ORDER — FENTANYL-BUPIVACAINE-NACL 0.5-0.125-0.9 MG/250ML-% EP SOLN
12.0000 mL/h | EPIDURAL | Status: DC | PRN
  Filled 2019-09-09: qty 250

## 2019-09-09 MED ORDER — PRENATAL MULTIVITAMIN CH: 1.0000 | ORAL_TABLET | Freq: Every day | ORAL | Status: DC

## 2019-09-09 MED ORDER — HYDRALAZINE HCL 20 MG/ML IJ SOLN: 10.0000 mg | INTRAMUSCULAR | Status: DC | PRN

## 2019-09-09 MED ORDER — MAGNESIUM SULFATE BOLUS VIA INFUSION
4.0000 g | Freq: Once | INTRAVENOUS | Status: AC
  Administered 2019-09-09: 4 g via INTRAVENOUS
  Filled 2019-09-09: qty 1000

## 2019-09-09 MED ORDER — OXYTOCIN 40 UNITS IN NORMAL SALINE INFUSION - SIMPLE MED: 2.5000 [IU]/h | INTRAVENOUS | Status: DC

## 2019-09-09 MED ORDER — MAGNESIUM SULFATE 40 GM/1000ML IV SOLN
2.0000 g/h | INTRAVENOUS | Status: DC
  Administered 2019-09-09: 2 g/h via INTRAVENOUS
  Filled 2019-09-09: qty 1000

## 2019-09-09 MED ORDER — EPHEDRINE 5 MG/ML INJ: 10.0000 mg | INTRAVENOUS | Status: DC | PRN

## 2019-09-09 MED ORDER — BETAMETHASONE SOD PHOS & ACET 6 (3-3) MG/ML IJ SUSP
12.0000 mg | INTRAMUSCULAR | Status: DC
  Administered 2019-09-09: 18:00:00 12 mg via INTRAMUSCULAR
  Filled 2019-09-09: qty 5

## 2019-09-09 MED ORDER — TERBUTALINE SULFATE 1 MG/ML IJ SOLN: 0.2500 mg | Freq: Once | INTRAMUSCULAR | Status: DC | PRN

## 2019-09-09 MED ORDER — OXYTOCIN BOLUS FROM INFUSION
500.0000 mL | Freq: Once | INTRAVENOUS | Status: AC
  Administered 2019-09-10: 500 mL via INTRAVENOUS

## 2019-09-09 MED ORDER — ONDANSETRON HCL 4 MG/2ML IJ SOLN: 4.0000 mg | Freq: Four times a day (QID) | INTRAMUSCULAR | Status: DC | PRN

## 2019-09-09 MED ORDER — LIDOCAINE HCL (PF) 1 % IJ SOLN: 30.0000 mL | INTRAMUSCULAR | Status: DC | PRN

## 2019-09-09 MED ORDER — DOCUSATE SODIUM 100 MG PO CAPS
100.0000 mg | ORAL_CAPSULE | Freq: Every day | ORAL | Status: DC
  Administered 2019-09-10 – 2019-09-11 (×2): 100 mg via ORAL
  Filled 2019-09-09 (×2): qty 1

## 2019-09-09 MED ORDER — LABETALOL HCL 5 MG/ML IV SOLN
INTRAVENOUS | Status: AC
  Filled 2019-09-09: qty 4

## 2019-09-09 MED ORDER — DIPHENHYDRAMINE HCL 50 MG/ML IJ SOLN: 12.5000 mg | INTRAMUSCULAR | Status: DC | PRN

## 2019-09-09 MED ORDER — LABETALOL HCL 5 MG/ML IV SOLN
40.0000 mg | INTRAVENOUS | Status: DC | PRN
  Administered 2019-09-09: 40 mg via INTRAVENOUS
  Filled 2019-09-09: qty 8

## 2019-09-09 MED ORDER — SODIUM CHLORIDE (PF) 0.9 % IJ SOLN
INTRAMUSCULAR | Status: DC | PRN
  Administered 2019-09-09: 12 mL/h via EPIDURAL

## 2019-09-09 NOTE — MAU Note (Signed)
Pt sent from MD office for BP evaluation.  Denies H/A, visual disturbances, & epigastric pain.  Endorses +FM.  Denies VB or LOF.

## 2019-09-09 NOTE — Anesthesia Procedure Notes (Signed)
Epidural Patient location during procedure: OB Start time: 09/09/2019 9:23 PM End time: 09/09/2019 9:30 PM  Staffing Anesthesiologist: Mal Amabile, MD Performed: anesthesiologist   Preanesthetic Checklist Completed: patient identified, IV checked, site marked, risks and benefits discussed, surgical consent, monitors and equipment checked, pre-op evaluation and timeout performed  Epidural Patient position: sitting Prep: DuraPrep and site prepped and draped Patient monitoring: continuous pulse ox and blood pressure Approach: midline Location: L3-L4 Injection technique: LOR air  Needle:  Needle type: Tuohy  Needle gauge: 17 G Needle length: 9 cm and 9 Needle insertion depth: 5 cm cm Catheter type: closed end flexible Catheter size: 19 Gauge Catheter at skin depth: 10 cm Test dose: negative and Other  Assessment Events: blood not aspirated, injection not painful, no injection resistance, no paresthesia and negative IV test  Additional Notes Patient identified. Risks and benefits discussed including failed block, incomplete  Pain control, post dural puncture headache, nerve damage, paralysis, blood pressure Changes, nausea, vomiting, reactions to medications-both toxic and allergic and post Partum back pain. All questions were answered. Patient expressed understanding and wished to proceed. Sterile technique was used throughout procedure. Epidural site was Dressed with sterile barrier dressing. No paresthesias, signs of intravascular injection Or signs of intrathecal spread were encountered.  Patient was more comfortable after the epidural was dosed. Please see RN's note for documentation of vital signs and FHR which are stable. Reason for block:procedure for pain

## 2019-09-09 NOTE — MAU Provider Note (Deleted)
Cindy [name] is a 24 y.o. female [phone] with IUP at [redacted]w[redacted]d presenting for Induction of labor. Pt states she has not been having contractions. Denies VB, LOF, Membranes are intact , with active fetal movement.   [facility] 12 wks. Pt went to health department today for a routine pregnancy check, they found her BP to be elevated at 160/90's so was sent to [place]. Denies headache, chest pain, blurry vision, seeing spots.   Prenatal History/Complications: denies  Past Medical History: Past Medical History:  Diagnosis Date  . Asthma   . Heart murmur   . Infection    UTI  . Kidney stone   . Kidney stones     Past Surgical History: Past Surgical History:  Procedure Laterality Date  . NO PAST SURGERIES      Obstetrical History: OB History    Gravida  4   Para  1   Term  1   Preterm  0   AB  2   Living  1     SAB  2   TAB  0   Ectopic  0   Multiple  0   Live Births  1            Social History: Social History   Socioeconomic History  . Marital status: Single    Spouse name: Not on file  . Number of children: Not on file  . Years of education: Not on file  . Highest education level: Not on file  Occupational History  . Not on file  Tobacco Use  . Smoking status: Never Smoker  . Smokeless tobacco: Never Used  Substance and Sexual Activity  . Alcohol use: No  . Drug use: Yes    Types: Marijuana    Comment: February 2021  . Sexual activity: Yes    Birth control/protection: None  Other Topics Concern  . Not on file  Social History Narrative  . Not on file   Social Determinants of Health   Financial Resource Strain:   . Difficulty of Paying Living Expenses:   Food Insecurity:   . Worried About Programme researcher, broadcasting/film/video in the Last Year:   . Barista in the Last Year:   Transportation Needs:   . Freight forwarder (Medical):   Marland Kitchen Lack of Transportation (Non-Medical):   Physical Activity:   . Days of Exercise per  Week:   . Minutes of Exercise per Session:   Stress:   . Feeling of Stress :   Social Connections:   . Frequency of Communication with Friends and Family:   . Frequency of Social Gatherings with Friends and Family:   . Attends Religious Services:   . Active Member of Clubs or Organizations:   . Attends Banker Meetings:   Marland Kitchen Marital Status:     Family History: Family History  Problem Relation Age of Onset  . Healthy Mother   . Healthy Father     Allergies: Allergies  Allergen Reactions  . Acetaminophen Anaphylaxis and Swelling    Throat swells, couldn't breath.    Medications Prior to Admission  Medication Sig Dispense Refill Last Dose  . omeprazole (PRILOSEC) 20 MG capsule Take 1 capsule (20 mg total) by mouth daily. 30 capsule 3   . Prenatal Vit-Fe Fumarate-FA (MULTIVITAMIN-PRENATAL) 27-0.8 MG TABS tablet Take 1 tablet by mouth daily at 12 noon.   09/07/2019  . promethazine (PHENERGAN) 25 MG tablet Take 1 tablet (  25 mg total) by mouth every 6 (six) hours as needed for nausea or vomiting. May insert vaginally, if unable to keep anything down 30 tablet 3         Review of Systems   Constitutional: Negative for fever and chills Eyes: Negative for visual disturbances Respiratory: Negative for shortness of breath, dyspnea Cardiovascular: Negative for chest pain or palpitations  Gastrointestinal: Negative for vomiting, diarrhea and constipation.  POSITIVE for abdominal pain (contractions) Genitourinary: Negative for dysuria and urgency Musculoskeletal: Negative for back pain, joint pain, myalgias  Neurological: Negative for dizziness and headaches    Blood pressure (!) 137/97, pulse 73, temperature 98.8 F (37.1 C), temperature source Oral, resp. rate 20, height 5\' 5"  (1.651 m), weight 63.6 kg, last menstrual period 12/29/2018, SpO2 100 %, unknown if currently breastfeeding. General appearance: alert Lungs: normal respiratory effort Heart: regular rate  and rhythm Abdomen: soft, non-tender; bowel sounds normal Extremities: Homans sign is negative, no sign of DVT DTR's 2+ Presentation: cephalic Fetal monitoring  Baseline: 130 bpm  Uterine activity  No contractions     Prenatal labs: ABO, Rh:  A+, Rh (D) Antibody:   No antibodies Rubella:   immune RPR:   non reactive HBsAg:   nonreactive HIV: Non Reactive (09/11 1335) GBS:    1 hr Glucola 115 Genetic screening  negative Anatomy [birth year]2002 no abnormalities  Prenatal Transfer Tool  Maternal Diabetes: No Genetic Screening: Normal Maternal Ultrasounds/Referrals: Normal Fetal Ultrasounds or other Referrals:  None Maternal Substance Abuse:  marijuana Significant Maternal Medications:  None Significant Maternal Lab Results: None     Results for orders placed or performed during the hospital encounter of 09/09/19 (from the past 24 hour(s))  Urinalysis, Routine w reflex microscopic   Collection Time: 09/09/19  1:24 PM  Result Value Ref Range   Color, Urine YELLOW YELLOW   APPearance CLEAR CLEAR   Specific Gravity, Urine 1.025 1.005 - 1.030   pH 6.0 5.0 - 8.0   Glucose, UA NEGATIVE NEGATIVE mg/dL   Hgb urine dipstick NEGATIVE NEGATIVE   Bilirubin Urine NEGATIVE NEGATIVE   Ketones, ur NEGATIVE NEGATIVE mg/dL   Protein, ur 09/11/19 (A) NEGATIVE mg/dL   Nitrite NEGATIVE NEGATIVE   Leukocytes,Ua NEGATIVE NEGATIVE   RBC / HPF 0-5 0 - 5 RBC/hpf   WBC, UA 0-5 0 - 5 WBC/hpf   Bacteria, UA NONE SEEN NONE SEEN   Squamous Epithelial / LPF 0-5 0 - 5   Mucus PRESENT   CBC   Collection Time: 09/09/19  2:45 PM  Result Value Ref Range   WBC 8.0 4.0 - 10.5 K/uL   RBC 3.29 (L) 3.87 - 5.11 MIL/uL   Hemoglobin 10.1 (L) 12.0 - 15.0 g/dL   HCT 09/11/19 (L) 32.6 - 71.2 %   MCV 91.5 80.0 - 100.0 fL   MCH 30.7 26.0 - 34.0 pg   MCHC 33.6 30.0 - 36.0 g/dL   RDW 45.8 09.9 - 83.3 %   Platelets 244 150 - 400 K/uL   nRBC 0.0 0.0 - 0.2 %  Comprehensive metabolic panel   Collection Time: 09/09/19  2:45 PM   Result Value Ref Range   Sodium 138 135 - 145 mmol/L   Potassium 4.4 3.5 - 5.1 mmol/L   Chloride 106 98 - 111 mmol/L   CO2 22 22 - 32 mmol/L   Glucose, Bld 80 70 - 99 mg/dL   BUN 7 6 - 20 mg/dL   Creatinine, Ser 09/11/19 0.44 - 1.00 mg/dL  Calcium 8.8 (L) 8.9 - 10.3 mg/dL   Total Protein 5.7 (L) 6.5 - 8.1 g/dL   Albumin 2.6 (L) 3.5 - 5.0 g/dL   AST 16 15 - 41 U/L   ALT 13 0 - 44 U/L   Alkaline Phosphatase 163 (H) 38 - 126 U/L   Total Bilirubin 0.6 0.3 - 1.2 mg/dL   GFR calc non Af Amer >60 >60 mL/min   GFR calc Af Amer >60 >60 mL/min   Anion gap 10 5 - 15  Protein / creatinine ratio, urine   Collection Time: 09/09/19  3:22 PM  Result Value Ref Range   Creatinine, Urine 272.14 mg/dL   Total Protein, Urine 74 mg/dL   Protein Creatinine Ratio 0.27 (H) 0.00 - 0.15 mg/mg[Cre]    Assessment: Cindy [name] is a 24 y.o. [phone] with an IUP at [redacted]w[redacted]d presenting for induction of labor due to elevated blood pressure  Plan: #Labor: expectant management #Pain:  Per request #FWB Cat 1 #ID: GBS:   #MOF:  Breast feeding #MOC: depoprevera  Irven Shelling 09/09/2019, 5:46 PM

## 2019-09-09 NOTE — H&P (Signed)
Cindy [name] is a 24 y.o. female [phone] with IUP at [redacted]w[redacted]d presenting for observation on OB-Specialty care until s/p 2 doses of BMZ. Pt states she has not been having contractions. Denies VB, LOF, Membranes are intact , with active fetal movement.   [facility] at health department since 12 wks. Pt went to health department today for a routine pregnancy check, they found her BP to be elevated at 160/90's so was sent to MAU for evaluation. Denies headache, chest pain, blurry vision, seeing spots, RUQ pain. She has had an uncomplicated pregnancy thus far.   Prenatal History/Complications: denies  Past Medical History: Past Medical History:  Diagnosis Date  . Asthma   . Heart murmur   . Infection    UTI  . Kidney stone   . Kidney stones     Past Surgical History: Past Surgical History:  Procedure Laterality Date  . NO PAST SURGERIES      Obstetrical History: OB History     Gravida  4   Para  1   Term  1   Preterm  0   AB  2   Living  1      SAB  2   TAB  0   Ectopic  0   Multiple  0   Live Births  1            Social History: Social History   Socioeconomic History  . Marital status: Single    Spouse name: Not on file  . Number of children: Not on file  . Years of education: Not on file  . Highest education level: Not on file  Occupational History  . Not on file  Tobacco Use  . Smoking status: Never Smoker  . Smokeless tobacco: Never Used  Substance and Sexual Activity  . Alcohol use: No  . Drug use: Yes    Types: Marijuana    Comment: February 2021  . Sexual activity: Yes    Birth control/protection: None  Other Topics Concern  . Not on file  Social History Narrative  . Not on file   Social Determinants of Health   Financial Resource Strain:   . Difficulty of Paying Living Expenses:   Food Insecurity:   . Worried About Programme researcher, broadcasting/film/video in the Last Year:   . Barista in the Last Year:   Transportation Needs:   . Automotive engineer (Medical):   Marland Kitchen Lack of Transportation (Non-Medical):   Physical Activity:   . Days of Exercise per Week:   . Minutes of Exercise per Session:   Stress:   . Feeling of Stress :   Social Connections:   . Frequency of Communication with Friends and Family:   . Frequency of Social Gatherings with Friends and Family:   . Attends Religious Services:   . Active Member of Clubs or Organizations:   . Attends Banker Meetings:   Marland Kitchen Marital Status:     Family History: Family History  Problem Relation Age of Onset  . Healthy Mother   . Healthy Father     Allergies: Allergies  Allergen Reactions  . Acetaminophen Anaphylaxis and Swelling    Throat swells, couldn't breath.    Medications Prior to Admission  Medication Sig Dispense Refill Last Dose  . omeprazole (PRILOSEC) 20 MG capsule Take 1 capsule (20 mg total) by mouth daily. 30 capsule 3   . Prenatal Vit-Fe Fumarate-FA (MULTIVITAMIN-PRENATAL) 27-0.8 MG TABS tablet Take  1 tablet by mouth daily at 12 noon.   09/07/2019  . promethazine (PHENERGAN) 25 MG tablet Take 1 tablet (25 mg total) by mouth every 6 (six) hours as needed for nausea or vomiting. May insert vaginally, if unable to keep anything down 30 tablet 3         Review of Systems   Constitutional: Negative for fever and chills Eyes: Negative for visual disturbances Respiratory: Negative for shortness of breath, dyspnea Cardiovascular: Negative for chest pain or palpitations  Gastrointestinal: Negative for vomiting, diarrhea and constipation.  POSITIVE for abdominal pain (contractions) Genitourinary: Negative for dysuria and urgency Musculoskeletal: Negative for back pain, joint pain, myalgias  Neurological: Negative for dizziness and headaches    Blood pressure (!) 137/97, pulse 73, temperature 98.8 F (37.1 C), temperature source Oral, resp. rate 20, height 5\' 5"  (1.651 m), weight 63.6 kg, last menstrual period 12/29/2018, SpO2 100 %,  unknown if currently breastfeeding. General appearance: alert Lungs: normal respiratory effort Heart: regular rate and rhythm Abdomen: soft, non-tender; bowel sounds normal Extremities: Homans sign is negative, no sign of DVT DTR's 2+ Presentation: cephalic Fetal monitoring  Baseline: 130 bpm  Uterine activity  No contractions     Prenatal labs: ABO, Rh:  A+, Rh (D) Antibody:   No antibodies Rubella:   immune RPR:   non reactive HBsAg:   nonreactive HIV: Non Reactive (09/11 1335) GBS:   pending 1 hr Glucola 115 Genetic screening  negative Anatomy US no abnormalities  Prenatal Transfer Tool  Maternal Diabetes: No Genetic Screening: Normal Maternal Ultrasounds/Referrals: Normal Fetal Ultrasounds or other Referrals:  None Maternal Substance Abuse:  marijuana Significant Maternal Medications:  None Significant Maternal Lab Results: None     Results for orders placed or performed during the hospital encounter of 09/09/19 (from the past 24 hour(s))  Urinalysis, Routine w reflex microscopic   Collection Time: 09/09/19  1:24 PM  Result Value Ref Range   Color, Urine YELLOW YELLOW   APPearance CLEAR CLEAR   Specific Gravity, Urine 1.025 1.005 - 1.030   pH 6.0 5.0 - 8.0   Glucose, UA NEGATIVE NEGATIVE mg/dL   Hgb urine dipstick NEGATIVE NEGATIVE   Bilirubin Urine NEGATIVE NEGATIVE   Ketones, ur NEGATIVE NEGATIVE mg/dL   Protein, ur 100 (A) NEGATIVE mg/dL   Nitrite NEGATIVE NEGATIVE   Leukocytes,Ua NEGATIVE NEGATIVE   RBC / HPF 0-5 0 - 5 RBC/hpf   WBC, UA 0-5 0 - 5 WBC/hpf   Bacteria, UA NONE SEEN NONE SEEN   Squamous Epithelial / LPF 0-5 0 - 5   Mucus PRESENT   CBC   Collection Time: 09/09/19  2:45 PM  Result Value Ref Range   WBC 8.0 4.0 - 10.5 K/uL   RBC 3.29 (L) 3.87 - 5.11 MIL/uL   Hemoglobin 10.1 (L) 12.0 - 15.0 g/dL   HCT 30.1 (L) 36.0 - 46.0 %   MCV 91.5 80.0 - 100.0 fL   MCH 30.7 26.0 - 34.0 pg   MCHC 33.6 30.0 - 36.0 g/dL   RDW 13.4 11.5 - 15.5 %    Platelets 244 150 - 400 K/uL   nRBC 0.0 0.0 - 0.2 %  Comprehensive metabolic panel   Collection Time: 09/09/19  2:45 PM  Result Value Ref Range   Sodium 138 135 - 145 mmol/L   Potassium 4.4 3.5 - 5.1 mmol/L   Chloride 106 98 - 111 mmol/L   CO2 22 22 - 32 mmol/L   Glucose, Bld 80 70 -  99 mg/dL   BUN 7 6 - 20 mg/dL   Creatinine, Ser 9.47 0.44 - 1.00 mg/dL   Calcium 8.8 (L) 8.9 - 10.3 mg/dL   Total Protein 5.7 (L) 6.5 - 8.1 g/dL   Albumin 2.6 (L) 3.5 - 5.0 g/dL   AST 16 15 - 41 U/L   ALT 13 0 - 44 U/L   Alkaline Phosphatase 163 (H) 38 - 126 U/L   Total Bilirubin 0.6 0.3 - 1.2 mg/dL   GFR calc non Af Amer >60 >60 mL/min   GFR calc Af Amer >60 >60 mL/min   Anion gap 10 5 - 15  Protein / creatinine ratio, urine   Collection Time: 09/09/19  3:22 PM  Result Value Ref Range   Creatinine, Urine 272.14 mg/dL   Total Protein, Urine 74 mg/dL   Protein Creatinine Ratio 0.27 (H) 0.00 - 0.15 mg/mg[Cre]    Assessment: NATALEIGH GRIFFIN is a 24 y.o. [id number] with an IUP at [redacted]w[redacted]d presenting for observation and steroid administration.    Nicole Kindred 09/09/2019, 5:46 PM   I confirm that I have verified the information documented in the physician assistant student's note and that I have also personally reperformed the history, physical exam and all medical decision making activities of this service and have verified that all service and findings are accurately documented in this student's note.   -Patient's BP was mildly elevated while in MAU; growth US done as patient's FH measured 28 weeks. Growth US shows FGR at less than 1% but BPP is reassuring. NST: 140 bpm, mod var, present acel, neg decels, no contractions.   -Patient to be admitted to [facility] care for BMZ; if severe range pressures, she will be induced.  -Labor team and MFM aware of plan of care.     Marylene Land, CNM 09/09/2019 6:09 PM

## 2019-09-09 NOTE — Anesthesia Preprocedure Evaluation (Addendum)
Anesthesia Evaluation  Patient identified by MRN, date of birth, ID band Patient awake    Reviewed: Allergy & Precautions, NPO status , Patient's Chart, lab work & pertinent test results, reviewed documented beta blocker date and time   Airway Mallampati: II  TM Distance: >3 FB Neck ROM: Full    Dental no notable dental hx. (+) Teeth Intact   Pulmonary asthma ,    Pulmonary exam normal breath sounds clear to auscultation       Cardiovascular hypertension, Pt. on medications and Pt. on home beta blockers Normal cardiovascular exam+ Valvular Problems/Murmurs  Rhythm:Regular Rate:Normal     Neuro/Psych negative neurological ROS  negative psych ROS   GI/Hepatic GERD  Medicated and Controlled,  Endo/Other    Renal/GU Renal disease  negative genitourinary   Musculoskeletal negative musculoskeletal ROS (+)   Abdominal   Peds  Hematology  (+) anemia ,   Anesthesia Other Findings   Reproductive/Obstetrics (+) Pregnancy Pre eclampsia Pre term 36 2/7 weeks                            Anesthesia Physical Anesthesia Plan  ASA: IV  Anesthesia Plan: Epidural   Post-op Pain Management:    Induction:   PONV Risk Score and Plan:   Airway Management Planned: Natural Airway  Additional Equipment:   Intra-op Plan:   Post-operative Plan:   Informed Consent: I have reviewed the patients History and Physical, chart, labs and discussed the procedure including the risks, benefits and alternatives for the proposed anesthesia with the patient or authorized representative who has indicated his/her understanding and acceptance.       Plan Discussed with: Anesthesiologist  Anesthesia Plan Comments:         Anesthesia Quick Evaluation

## 2019-09-09 NOTE — Progress Notes (Signed)
Labor Progress Note Cindy [name] is a 24 y.o. [id number] at [redacted]w[redacted]d presented for IOL for PreE with severe features and IUGR <1%tile.  S:  No complaints. O:  BP 139/90   Pulse 87   Temp 97.9 F (36.6 C) (Axillary)   Resp 15   Ht 5\' 5"  (1.651 m)   Wt 63.6 kg   LMP 12/29/2018   SpO2 100%   BMI 23.35 kg/m  EFM: 130/moderate/acels present  CVE: Dilation: 5 Effacement (%): 80 Station: -1 Presentation: Vertex Exam by:: Dr. [email or web address]   A&P: 24 y.o. 22 [redacted]w[redacted]d for IOL for PreE with severe features and IUGR <1%tile. #Labor: AROM performed. Continue pitocin. #Pain: Epidural #FWB: Cat I #GBS unknown - PCN #PreE with severe features: No recent severe range pressures since change of shift, continue mag. #Preterm IOL: s/p BMZ at 1743  Anticipate vaginal delivery  [redacted]w[redacted]d, DO 10:27 PM

## 2019-09-09 NOTE — Progress Notes (Addendum)
LABOR PROGRESS NOTE  Cindy [name] is a 24 y.o. [phone] at [redacted]w[redacted]d  admitted for IOL for PreE w SF, IUGR <1%.  Subjective: Comfortable Transferred from [facility] for IOL due to multiple severe range BP's  Objective: BP (!) 156/94   Pulse 76   Temp 98.8 F (37.1 C) (Oral)   Resp 20   Ht 5\' 5"  (1.651 m)   Wt 63.6 kg   LMP 12/29/2018   SpO2 100%   BMI 23.35 kg/m  or  Vitals:   09/09/19 1749 09/09/19 1806 09/09/19 1823 09/09/19 1901  BP: (!) 169/92 (!) 167/118 (!) 143/96 (!) 156/94  Pulse:  68 70 76  Resp:      Temp:      TempSrc:      SpO2:      Weight:      Height:        Cervix: 3/70/-1   FHT: baseline rate 130, moderate varibility, +acel, -decel Toco: rare ctx  Labs: Lab Results  Component Value Date   WBC 8.0 09/09/2019   HGB 10.1 (L) 09/09/2019   HCT 30.1 (L) 09/09/2019   MCV 91.5 09/09/2019   PLT 244 09/09/2019    Patient Active Problem List   Diagnosis Date Noted  . IUGR (intrauterine growth restriction) affecting care of mother 09/09/2019  . Preeclampsia, severe, third trimester 09/09/2019  . Bacterial vaginosis 03/05/2019  . Subchorionic hematoma 03/05/2019  . Morning sickness 03/05/2019  . Antepartum bleeding, first trimester 07/10/2018  . Pregnancy of unknown anatomic location 07/10/2018  . Normal labor 05/30/2016  . TINEA VERSICOLOR 08/21/2010  . 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 DYSPNEA ON EXERTION 08/21/2010  . ANEMIA 03/31/2007  . MURMUR 03/31/2007  . CHEST WALL PAIN, HX OF 03/31/2007    Assessment / Plan: 24 y.o. 22 at [redacted]w[redacted]d here for IOL PreE w SF, IUGR <1%.  Labor: favorable cervix on initial exam, start pitocin and AROM once in good pattern Fetal Wellbeing:  Cat I Pain Control:  IV pain meds PRN, epidural upon request GBS: unknown, starting penicillin Anticipated MOD:  SVD  PreE w SF: Asymptomatic, multiple severe range BP's s/p 20>40mg  IV labetalol, labs unremarkable. Starting Mg gtt, monitor for symptoms/signs of toxicity.    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

## 2019-09-10 ENCOUNTER — Encounter (HOSPITAL_COMMUNITY): Payer: Self-pay | Admitting: Obstetrics and Gynecology

## 2019-09-10 DIAGNOSIS — Z3A36 36 weeks gestation of pregnancy: Secondary | ICD-10-CM

## 2019-09-10 DIAGNOSIS — O365930 Maternal care for other known or suspected poor fetal growth, third trimester, not applicable or unspecified: Secondary | ICD-10-CM

## 2019-09-10 DIAGNOSIS — O1414 Severe pre-eclampsia complicating childbirth: Secondary | ICD-10-CM

## 2019-09-10 LAB — RPR: RPR Ser Ql: NONREACTIVE

## 2019-09-10 LAB — CBC
HCT: 28.9 % — ABNORMAL LOW (ref 36.0–46.0)
Hemoglobin: 9.5 g/dL — ABNORMAL LOW (ref 12.0–15.0)
MCH: 30.4 pg (ref 26.0–34.0)
MCHC: 32.9 g/dL (ref 30.0–36.0)
MCV: 92.6 fL (ref 80.0–100.0)
Platelets: 263 10*3/uL (ref 150–400)
RBC: 3.12 MIL/uL — ABNORMAL LOW (ref 3.87–5.11)
RDW: 13.5 % (ref 11.5–15.5)
WBC: 18.1 10*3/uL — ABNORMAL HIGH (ref 4.0–10.5)
nRBC: 0 % (ref 0.0–0.2)

## 2019-09-10 LAB — SARS CORONAVIRUS 2 (TAT 6-24 HRS): SARS Coronavirus 2: NEGATIVE

## 2019-09-10 MED ORDER — DIBUCAINE (PERIANAL) 1 % EX OINT: 1.0000 "application " | TOPICAL_OINTMENT | CUTANEOUS | Status: DC | PRN

## 2019-09-10 MED ORDER — OXYCODONE HCL 5 MG PO TABS
5.0000 mg | ORAL_TABLET | ORAL | Status: DC | PRN
  Administered 2019-09-10 (×2): 5 mg via ORAL
  Filled 2019-09-10 (×2): qty 1

## 2019-09-10 MED ORDER — IBUPROFEN 600 MG PO TABS
600.0000 mg | ORAL_TABLET | Freq: Four times a day (QID) | ORAL | Status: DC
  Administered 2019-09-10 – 2019-09-12 (×9): 600 mg via ORAL
  Filled 2019-09-10 (×10): qty 1

## 2019-09-10 MED ORDER — OXYCODONE HCL 5 MG PO TABS: 10.0000 mg | ORAL_TABLET | ORAL | Status: DC | PRN

## 2019-09-10 MED ORDER — WITCH HAZEL-GLYCERIN EX PADS: 1.0000 "application " | MEDICATED_PAD | CUTANEOUS | Status: DC | PRN

## 2019-09-10 MED ORDER — ONDANSETRON HCL 4 MG PO TABS: 4.0000 mg | ORAL_TABLET | ORAL | Status: DC | PRN

## 2019-09-10 MED ORDER — SIMETHICONE 80 MG PO CHEW: 80.0000 mg | CHEWABLE_TABLET | ORAL | Status: DC | PRN

## 2019-09-10 MED ORDER — MISOPROSTOL 200 MCG PO TABS: 1000.0000 ug | ORAL_TABLET | Freq: Once | ORAL | Status: DC

## 2019-09-10 MED ORDER — COCONUT OIL OIL: 1.0000 "application " | TOPICAL_OIL | Status: DC | PRN

## 2019-09-10 MED ORDER — BENZOCAINE-MENTHOL 20-0.5 % EX AERO: 1.0000 "application " | INHALATION_SPRAY | CUTANEOUS | Status: DC | PRN

## 2019-09-10 MED ORDER — TETANUS-DIPHTH-ACELL PERTUSSIS 5-2.5-18.5 LF-MCG/0.5 IM SUSP: 0.5000 mL | Freq: Once | INTRAMUSCULAR | Status: DC

## 2019-09-10 MED ORDER — LACTATED RINGERS IV SOLN: INTRAVENOUS | Status: DC

## 2019-09-10 MED ORDER — ONDANSETRON HCL 4 MG/2ML IJ SOLN: 4.0000 mg | INTRAMUSCULAR | Status: DC | PRN

## 2019-09-10 MED ORDER — NIFEDIPINE ER OSMOTIC RELEASE 30 MG PO TB24
30.0000 mg | ORAL_TABLET | Freq: Every day | ORAL | Status: DC
  Administered 2019-09-10 – 2019-09-11 (×2): 30 mg via ORAL
  Filled 2019-09-10 (×2): qty 1

## 2019-09-10 MED ORDER — MAGNESIUM SULFATE 40 GM/1000ML IV SOLN
2.0000 g/h | INTRAVENOUS | Status: AC
  Administered 2019-09-10: 2 g/h via INTRAVENOUS
  Filled 2019-09-10: qty 1000

## 2019-09-10 MED ORDER — DIPHENHYDRAMINE HCL 25 MG PO CAPS: 25.0000 mg | ORAL_CAPSULE | Freq: Four times a day (QID) | ORAL | Status: DC | PRN

## 2019-09-10 MED ORDER — MEDROXYPROGESTERONE ACETATE 150 MG/ML IM SUSP: 150.0000 mg | Freq: Once | INTRAMUSCULAR | Status: DC

## 2019-09-10 MED ORDER — MISOPROSTOL 200 MCG PO TABS
ORAL_TABLET | ORAL | Status: AC
  Filled 2019-09-10: qty 5

## 2019-09-10 MED ORDER — SENNOSIDES-DOCUSATE SODIUM 8.6-50 MG PO TABS
2.0000 | ORAL_TABLET | ORAL | Status: DC
  Administered 2019-09-10 – 2019-09-12 (×2): 2 via ORAL
  Filled 2019-09-10 (×3): qty 2

## 2019-09-10 MED ORDER — PRENATAL MULTIVITAMIN CH
1.0000 | ORAL_TABLET | Freq: Every day | ORAL | Status: DC
  Administered 2019-09-10 – 2019-09-11 (×2): 1 via ORAL
  Filled 2019-09-10 (×2): qty 1

## 2019-09-10 NOTE — Plan of Care (Signed)
  Problem: Education: Goal: Knowledge of General Education information will improve Description: Including pain rating scale, medication(s)/side effects and non-pharmacologic comfort measures Outcome: Completed/Met   Problem: Education: Goal: Knowledge of disease or condition will improve Outcome: Completed/Met Goal: Knowledge of the prescribed therapeutic regimen will improve Outcome: Completed/Met Goal: Individualized Educational Video(s) Outcome: Completed/Met   Problem: Clinical Measurements: Goal: Complications related to the disease process, condition or treatment will be avoided or minimized Outcome: Completed/Met   

## 2019-09-10 NOTE — Discharge Instructions (Signed)

## 2019-09-10 NOTE — Discharge Summary (Signed)
  Postpartum Discharge Summary    Patient Name: Cindy [name] DOB: 03/01/1996 MRN: [id number]  Date of admission: 09/09/2019 Delivering Provider: SPARACINO, HAILEY L   Date of discharge: 09/12/2019  Admitting diagnosis: IUGR (intrauterine growth restriction) affecting care of mother [O36.5990] Preeclampsia, severe, third trimester [O14.13] Intrauterine pregnancy: [redacted]w[redacted]d     Secondary diagnosis:  Principal Problem:   Severe preeclampsia, delivered Active Problems:   Postpartum care following vaginal delivery   IUGR (intrauterine growth restriction) affecting care of mother   Anemia, postpartum  Additional problems: None     Discharge diagnosis: Preterm Pregnancy Delivered                                                                                                Post partum procedures: None  Augmentation: AROM and Pitocin  Complications: None  Hospital course:  Induction of Labor With Vaginal Delivery   23 y.o. yo G4P1021 at [redacted]w[redacted]d was admitted to the hospital 09/09/2019 for induction of labor.  Indication for induction: pre-E w/severe features (BP), severe IUGR <1%.  Patient had an uncomplicated labor course as follows:  She was sent to MAU from [facility] for evaluation after having severe-range blood pressures. While in MAU, growth US was performed due to low fundal height, and showed severe IUGR <1%. BPP was reassuring so she was admitted to [facility] for BMZ as her Bps were not severe range at that time.  While in [facility], she had several severe-range blood pressures, so she was transferred to L&D for induction. Her cervix was favorable, so pitocin was initiated. After epidural placement, she was AROMed and progressed to complete, delivering shortly after.  Membrane Rupture Time/Date: 10:24 PM ,09/09/2019   Intrapartum Procedures: Episiotomy: None [1]                                         Lacerations:  None [1]  Patient had delivery of a Viable female infant. 09/10/2019   Information for the patient's newborn:  Santori, Girl Catalea [[id number]]  Delivery Method: Vaginal, Spontaneous(Filed from Delivery Summary)    Details of delivery can be found in separate delivery note.  Patient had a routine postpartum course. Was on magnesium sulfate for eclampsia prophylaxis as per protocol.   Was started on Nifedipine XL 30 mg po qd for BP control.  Patient is discharged home 09/12/19. Delivery time: 12:01 AM    Magnesium Sulfate received: Yes BMZ received: Yes x1 dose Rhophylac:N/A MMR:No Transfusion:No  Physical exam  Vitals:   09/11/19 1731 09/11/19 1938 09/12/19 0100 09/12/19 0527  BP: 134/83 134/85 (!) 132/92 128/82  Pulse: 76 64 70 65  Resp: 16 18 17 16  Temp: 98.1 F (36.7 C) 98.4 F (36.9 C) 98.7 F (37.1 C) 98.6 F (37 C)  TempSrc: Oral Oral Oral Oral  SpO2: 100% 100% 100% 98%  Weight:      Height:       General: alert, cooperative and no distress Lochia: appropriate Uterine Fundus: firm Incision: N/A DVT Evaluation:   No evidence of DVT seen on physical exam. Negative Homan's sign. No cords or calf tenderness. Labs: Lab Results  Component Value Date   WBC 18.1 (H) 09/10/2019   HGB 9.5 (L) 09/10/2019   HCT 28.9 (L) 09/10/2019   MCV 92.6 09/10/2019   PLT 263 09/10/2019   CMP Latest Ref Rng & Units 09/09/2019  Glucose 70 - 99 mg/dL 80  BUN 6 - 20 mg/dL 7  Creatinine 0.44 - 1.00 mg/dL 0.83  Sodium 135 - 145 mmol/L 138  Potassium 3.5 - 5.1 mmol/L 4.4  Chloride 98 - 111 mmol/L 106  CO2 22 - 32 mmol/L 22  Calcium 8.9 - 10.3 mg/dL 8.8(L)  Total Protein 6.5 - 8.1 g/dL 5.7(L)  Total Bilirubin 0.3 - 1.2 mg/dL 0.6  Alkaline Phos 38 - 126 U/L 163(H)  AST 15 - 41 U/L 16  ALT 0 - 44 U/L 13   Edinburgh Score: Edinburgh Postnatal Depression Scale Screening Tool 09/11/2019  I have been able to laugh and see the funny side of things. 0  I have looked forward with enjoyment to things. 0  I have blamed myself unnecessarily when things went  wrong. 0  I have been anxious or worried for no good reason. 0  I have felt scared or panicky for no good reason. 0  Things have been getting on top of me. 1  I have been so unhappy that I have had difficulty sleeping. 0  I have felt sad or miserable. 0  I have been so unhappy that I have been crying. 0  The thought of harming myself has occurred to me. 0  Edinburgh Postnatal Depression Scale Total 1    Discharge instruction: per After Visit Summary and "Baby and Me Booklet".  After visit meds:  Allergies as of 09/12/2019      Reactions   Acetaminophen Anaphylaxis, Swelling   Throat swells, couldn't breath.      Medication List    TAKE these medications   albuterol 108 (90 Base) MCG/ACT inhaler Commonly known as: VENTOLIN HFA Inhale 1 puff into the lungs every 6 (six) hours as needed for wheezing or shortness of breath.   ferrous sulfate 325 (65 FE) MG tablet Commonly known as: FerrouSul Take 1 tablet (325 mg total) by mouth daily with breakfast.   ibuprofen 800 MG tablet Commonly known as: ADVIL Take 1 tablet (800 mg total) by mouth 3 (three) times daily with meals as needed for headache, mild pain, moderate pain or cramping.   NIFEdipine 30 MG 24 hr tablet Commonly known as: ADALAT CC Take 1 tablet (30 mg total) by mouth daily.   norethindrone 0.35 MG tablet Commonly known as: MICRONOR Take 1 tablet (0.35 mg total) by mouth daily. Start two weeks after delivery   prenatal multivitamin Tabs tablet Take 1 tablet by mouth daily at 12 noon.   senna-docusate 8.6-50 MG tablet Commonly known as: Senokot-S Take 2 tablets by mouth at bedtime as needed for mild constipation or moderate constipation.       Diet: routine diet  Activity: Advance as tolerated. Pelvic rest for 6 weeks.   Outpatient follow up:6 weeks Follow up Appt:No future appointments. Follow up Visit: Follow-up Information    [facility] in [facility] week(s).    Specialty: Obstetrics and Gynecology Why: You will be called with appointment details. Come to MAU for any emergencies Contact information: [address], [id number] [place] [address] [phone]              Please schedule this patient for Postpartum visit in: 6 weeks with the following provider: Any provider at [facility] ok For C/S patients schedule nurse incision check in weeks 2 weeks: no High risk pregnancy complicated by: Pre-E w/severe features (BP) and severe IUGR <1% Delivery mode:  SVD Anticipated Birth Control:  Micronor PP Procedures needed: BP check (at [facility]) in 1 week Schedule Integrated [place] visit: no  Newborn Data: Live born female  Birth Weight:  1960 g APGAR: 8, 8  Newborn Delivery   Birth date/time: 09/10/2019 00:01:00 Delivery type:       Baby Feeding: Breast Disposition:NICU   09/12/2019 Verita Schneiders, MD

## 2019-09-10 NOTE — Anesthesia Postprocedure Evaluation (Signed)
Anesthesia Post Note  Patient: Cindy [name]  Procedure(s) Performed: AN AD HOC LABOR EPIDURAL     Patient location during evaluation: Mother Baby Anesthesia Type: Epidural Level of consciousness: awake and alert, oriented and patient cooperative Pain management: pain level controlled Vital Signs Assessment: post-procedure vital signs reviewed and stable Respiratory status: spontaneous breathing Cardiovascular status: stable Postop Assessment: no headache, epidural receding, patient able to bend at knees and no signs of nausea or vomiting Anesthetic complications: no Comments: Pt. States she is walking.  States pain score is 5 but patient states her pain is manageable.  No questions or concerns voiced by patient.     Last Vitals:  Vitals:   09/10/19 0655 09/10/19 0731  BP:  136/89  Pulse:  98  Resp: 17 17  Temp:  36.5 C  SpO2:  97%    Last Pain:  Vitals:   09/10/19 0750  TempSrc:   PainSc: 8    Pain Goal: Patients Stated Pain Goal: 3 (09/10/19 0252)                 [name]

## 2019-09-10 NOTE — Progress Notes (Signed)
CSW completed chart review and will complete psychosocial assessment when MOB's magnesium is discontinued.   Celso Sickle, LCSW Clinical Social Worker [facility] Cell#: [phone]

## 2019-09-11 LAB — CULTURE, BETA STREP (GROUP B ONLY)

## 2019-09-11 MED ORDER — MEDROXYPROGESTERONE ACETATE 150 MG/ML IM SUSP
150.0000 mg | Freq: Once | INTRAMUSCULAR | Status: AC
  Filled 2019-09-11: qty 1

## 2019-09-11 NOTE — Progress Notes (Signed)
Post Partum Day 1 Subjective: no complaints, up ad lib, voiding and tolerating PO  Objective: Blood pressure 137/88, pulse 87, temperature 97.6 F (36.4 C), temperature source Oral, resp. rate 18, height 5\' 5"  (1.651 m), weight 63.6 kg, last menstrual period 12/29/2018, SpO2 99 %, unknown if currently breastfeeding.  Physical Exam:  General: alert Lochia: appropriate Uterine Fundus: firm and non-tender at U-2 DVT Evaluation: No evidence of DVT seen on physical exam.  Recent Labs    09/09/19 2010 09/10/19 0517  HGB 10.2* 9.5*  HCT 31.2* 28.9*    Assessment/Plan: Plan for discharge tomorrow  Continue Procardia 30 mg XL daily   LOS: 2 days   Cindy [name] C Niko Penson 09/11/2019, 7:32 AM

## 2019-09-11 NOTE — Lactation Note (Signed)
This note was copied from a baby's chart. Lactation Consultation Note LC attempted to see mom. Mom has gone to NICU. Let pamphlet at bedside. DEBP at bedside. Patient Name: Cindy [name] [id number] Date: 09/11/2019     Maternal Data    Feeding Feeding Type: Donor Breast Milk  LATCH Score                   Interventions    Lactation Tools Discussed/Used     Consult Status      Mallery Harshman, Diamond Nickel 09/11/2019, 2:58 AM

## 2019-09-11 NOTE — Lactation Note (Signed)
This note was copied from a baby's chart. Lactation Consultation Note NICU baby 9 hrs old. Mom has spent most of her time in the NICU w/the baby. Mom sitting up in bed eating supper. Reviewed pumping q3hrs, milk storage, breast massage, milk labels, LPI behavior, STS, I&O, supply and demand. Mom states baby is trying to latch and does OK. Mom is excited she is getting a little colostrum. Mom BF her 26 yr old for 1 month. Gave mom NICU book.  Mom stated she felt things were going OK.  Encouraged to call to set up appt. For latching assistance before d/c home.  Patient Name: Cindy [name] [id number] Date: 09/11/2019 Reason for consult: Initial assessment;Late-preterm 34-36.6wks;NICU baby;Infant < 6lbs   Maternal Data Has patient been taught Hand Expression?: Yes Does the patient have breastfeeding experience prior to this delivery?: Yes  Feeding Feeding Type: Donor Breast Milk  LATCH Score       Type of Nipple: Everted at rest and after stimulation  Comfort (Breast/Nipple): Soft / non-tender        Interventions Interventions: Breast compression;DEBP;Breast massage;Hand express  Lactation Tools Discussed/Used WIC Program: Yes Pump Review: Setup, frequency, and cleaning;Milk Storage Initiated by:: RN   Consult Status Consult Status: Follow-up Date: 09/12/19 Follow-up type: In-patient    [name] 09/11/2019, 9:07 PM

## 2019-09-12 DIAGNOSIS — O9081 Anemia of the puerperium: Secondary | ICD-10-CM

## 2019-09-12 MED ORDER — FERROUS SULFATE 325 (65 FE) MG PO TABS: 325.0000 mg | ORAL_TABLET | Freq: Every day | ORAL | 3 refills | Status: DC

## 2019-09-12 MED ORDER — NIFEDIPINE ER 30 MG PO TB24: 30.0000 mg | ORAL_TABLET | Freq: Every day | ORAL | 1 refills | Status: DC

## 2019-09-12 MED ORDER — SENNOSIDES-DOCUSATE SODIUM 8.6-50 MG PO TABS: 2.0000 | ORAL_TABLET | Freq: Every evening | ORAL | 1 refills | Status: DC | PRN

## 2019-09-12 MED ORDER — IBUPROFEN 800 MG PO TABS: 800.0000 mg | ORAL_TABLET | Freq: Three times a day (TID) | ORAL | 2 refills | Status: DC | PRN

## 2019-09-12 MED ORDER — NORETHINDRONE 0.35 MG PO TABS: 1.0000 | ORAL_TABLET | Freq: Every day | ORAL | 11 refills | Status: DC

## 2019-09-12 NOTE — Lactation Note (Signed)
This note was copied from a baby's chart. Lactation Consultation Note  Patient Name: Cindy [name] Date: 09/12/2019   Per Abigail Butts, RN [facility] nurse), Mom has an appt with [facility] tomorrow afternoon to get a pump.   Mom is planning to visit at bedside of infant before going home. I asked Olegario Messier, RN to request that if Mom needs anything from [place] before going home to have her NICU nurse call us.   Lurline Hare [facility] 09/12/2019, 9:53 AM

## 2019-09-13 ENCOUNTER — Ambulatory Visit: Payer: Self-pay

## 2019-09-13 LAB — SURGICAL PATHOLOGY

## 2019-09-13 NOTE — Lactation Note (Signed)
Lactation Consultation Note Mother is a P2,  infant is 3  hours old . Mother was given [facility] brochure and basic and Breastfeeding Your NICU Baby . Mother was sat up with a DEBP and assist with hand expression. Mother hand express 2 vials of colostrum with 3 ml in each vial. Mother was give labels and bottles.  She was advised in cleaning parts , collection and storage of ebm.  She is active with WIC .WIC Referral sent to [facility] to secure a pump. Staff nurse to offer hand pump with instructions.   Plan of Care : Mother to hand express before and after pumping to attempt to get colostrum. Pump using a DEBP after each feeding for 15-20 mins. Every 2-3 hours.   Mother to STS with infant as soon as allowed in the  NICU. Mother to page for assistance in the  NICU.  Mother is aware of available LC services at [facility], BFSG'S, OP Dept, and phone # for questions or concerns about breastfeeding.  Mother receptive to all teaching and plan of care.     Patient Name: JAZMA PICKEL TRRNH'A Date: 09/13/2019     Maternal Data    Feeding    LATCH Score                   Interventions    Lactation Tools Discussed/Used     Consult Status      Stevan Born [facility] 09/13/2019, 7:22 AM

## 2019-09-13 NOTE — Lactation Note (Addendum)
This note was copied from a baby's chart. Lactation Consultation Note:  Mother is a P2,  infant is 88  hours old , infant is in the NICU and is 36.6 weeks. Mother was given [facility] brochure and basic and Breastfeeding Your NICU Baby .   Mother was sat up with a DEBP and assist with hand expression.  Mother hand express 2 vials of colostrum with 3 ml in each vial. Mother was give labels and bottles as well as colostrum dots. She was advised in cleaning parts , collection and storage of ebm.  She is active with WIC .WIC Referral sent to [facility] to secure a pump. Staff nurse to offer hand pump with instructions.   Plan of Care : Mother to hand express before and after pumping to attempt to get colostrum. Pump using a DEBP after each feeding for 15-20 mins. Every 2-3 hours.   Mother to STS with infant as soon as allowed in the  NICU. Mother to page for assistance in the  NICU.  Mother is aware of available LC services at [facility], BFSG'S, OP Dept, and phone # for questions or concerns about breastfeeding.  Mother receptive to all teaching and plan of care.    Patient Name: Cindy [name] WHQPR'F Date: 09/13/2019     Maternal Data    Feeding Feeding Type: Donor Breast Milk Nipple Type: Extra Slow Flow  LATCH Score                   Interventions    Lactation Tools Discussed/Used     Consult Status      Cindy [name] 09/13/2019, 7:22 AM

## 2019-09-13 NOTE — Lactation Note (Signed)
This note was copied from a baby's chart. Lactation Consultation Note:  Mother is a P2,  infant is 11  hours old . Mother was given [facility] brochure and basic and Breastfeeding Your NICU Baby . Mother was sat up with a DEBP and assist with hand expression. Mother hand express 2 vials of colostrum with 3 ml in each vial. Mother was give labels and bottles.  She was advised in cleaning parts , collection and storage of ebm.  She is active with WIC .WIC Referral sent to [facility] to secure a pump. Staff nurse to offer hand pump with instructions.   Plan of Care : Mother to hand express before and after pumping to attempt to get colostrum. Pump using a DEBP after each feeding for 15-20 mins. Every 2-3 hours.   Mother to STS with infant as soon as allowed in the  NICU. Mother to page for assistance in the  NICU.  Mother is aware of available LC services at [facility], BFSG'S, OP Dept, and phone # for questions or concerns about breastfeeding.  Mother receptive to all teaching and plan of care.    Patient Name: Cindy [name] ENIDP'O Date: 09/13/2019     Maternal Data    Feeding Feeding Type: Donor Breast Milk Nipple Type: Extra Slow Flow  LATCH Score                   Interventions    Lactation Tools Discussed/Used     Consult Status      Cindy [name] 09/13/2019, 7:06 AM

## 2019-09-22 ENCOUNTER — Ambulatory Visit: Payer: Medicaid Other

## 2019-09-23 ENCOUNTER — Ambulatory Visit: Payer: Medicaid Other

## 2019-11-08 ENCOUNTER — Ambulatory Visit: Payer: Medicaid Other | Admitting: Family Medicine

## 2020-05-30 LAB — PREGNANCY, URINE: Preg Test, Ur: POSITIVE

## 2020-06-22 ENCOUNTER — Encounter: Payer: Self-pay | Admitting: General Practice

## 2020-06-22 DIAGNOSIS — B009 Herpesviral infection, unspecified: Secondary | ICD-10-CM | POA: Insufficient documentation

## 2020-06-24 NOTE — L&D Delivery Note (Addendum)
OB/GYN Faculty Practice Delivery Note  Cindy [name] is a 25 y.o. [id number] s/p vaginal delivery at [redacted]w[redacted]d s/p PPROM at [redacted]w[redacted]d. She was admitted to antepartum secondary to PPROM on 11/18/20.   ROM: 67h 63m with clear fluid GBS Status: unknown (swab collected prior to delivery) Maximum Maternal Temperature: 99.51F  Labor Progress: Pt admitted on 11/18/20 s/p PPROM at 0300 on 11/18/20. Latency antibiotics were started on admission and she received BMZ x2 (5/28, 5/29). NICU was consulted. On 11/20/20 shortly after 2100, pt became increasingly uncomfortable with contractions. Her cervical exam was noted to be 4-5/80/-1, and she was transferred to L&D. Penicillin was started on transfer to L&D given preterm with unknown GBS status, but pt rapidly progressed to complete cervical dilation at 2209 and then had an uncomplicated delivery s/p a brief second stage as noted below. Given preterm, NICU team was called and present in room prior to delivery.  Delivery Date/Time: 11/20/20 at 2217 Delivery: Called to room and patient was complete. Head delivered direct OP. Loose nuchal cord present x1; delivered through. Shoulder and body delivered in usual fashion. Infant with spontaneous cry, placed on mother's abdomen, dried and stimulated. Cord clamped x 2 after 1-minute delay, and cut by FOB under my direct supervision. Arterial cord gas (pH 7.45) and cord blood were drawn. Placenta delivered spontaneously with gentle cord traction. Fundus firm with massage and Pitocin, TXA. Labia, perineum, vagina, and cervix were inspected, without evidence of lacerations.   Placenta: 3-vessel cord, intact, sent to pathology Complications: none Lacerations: none EBL: 200 ml Analgesia: IV fentanyl s/p delivery  Infant: viable female  APGARs 9 & 9  weight 1590g  Lynnda Shields, MD OB/GYN Fellow, Faculty Practice

## 2020-06-26 ENCOUNTER — Other Ambulatory Visit: Payer: Self-pay

## 2020-06-26 ENCOUNTER — Telehealth (INDEPENDENT_AMBULATORY_CARE_PROVIDER_SITE_OTHER): Payer: Medicaid Other | Admitting: *Deleted

## 2020-06-26 DIAGNOSIS — O0990 Supervision of high risk pregnancy, unspecified, unspecified trimester: Secondary | ICD-10-CM | POA: Insufficient documentation

## 2020-06-26 DIAGNOSIS — Z3A00 Weeks of gestation of pregnancy not specified: Secondary | ICD-10-CM

## 2020-06-26 DIAGNOSIS — O09899 Supervision of other high risk pregnancies, unspecified trimester: Secondary | ICD-10-CM | POA: Insufficient documentation

## 2020-06-26 DIAGNOSIS — B009 Herpesviral infection, unspecified: Secondary | ICD-10-CM

## 2020-06-26 DIAGNOSIS — O09299 Supervision of pregnancy with other poor reproductive or obstetric history, unspecified trimester: Secondary | ICD-10-CM

## 2020-06-26 DIAGNOSIS — Z8759 Personal history of other complications of pregnancy, childbirth and the puerperium: Secondary | ICD-10-CM

## 2020-06-26 NOTE — Progress Notes (Signed)
New OB Intake  I connected with  Cindy [name] on 06/26/20 at  3:15 PM EST by MyChart and verified that I am speaking with the correct person using two identifiers. Nurse is located at [facility] and pt is located at home.  I discussed the limitations, risks, security and privacy concerns of performing an evaluation and management service by telephone and the availability of in person appointments. I also discussed with the patient that there may be a patient responsible charge related to this service. The patient expressed understanding and agreed to proceed.  I explained I am completing New OB Intake today. We discussed her EDD of 01/13/20 that is based on LMP of 04/07/20. Pt is G5/P1122. I reviewed her allergies, medications, Medical/Surgical/OB history, and appropriate screenings. I informed her of [facility] services. Based on history, this is a/an complicated  Pregnancy due to history of preeclampsia in previous pregnancy that had to be delivered at [redacted]w[redacted]d. .  Concerns addressed today  Delivery Plans: Plans to deliver at [facility].  MyChart/Babyscripts MyChart access verified. I explained pt will have some visits in office and some virtually. Babyscripts instructions given.   Blood Pressure Cuff Patient states she has a blood pressure cuff she knows how to use.  Explained after first prenatal appt pt will check weekly and document in Babyscripts.  Anatomy US Explained I will schedule next  [place]  around 19 weeks. Anatomy US scheduled for 08/18/20 at 0815. Pt notified to arrive at 0800.  Labs Discussed Cindy [name] genetic screening with patient. Would like both Panorama and Horizon drawn at new OB visit. Routine prenatal labs needed.  WIC Patient confirms she has WIC.   Covid Vaccine: Patient has not had vaccine.   New Patient Zoom Meeting Informed patient about new patient meeting; information placed in AVS.   First visit review I reviewed new OB appt with pt. I explained she will have a  pelvic exam, ob bloodwork with genetic screening, and PAP smear. Explained pt will be seen by Dr. Debroah Loop at first visit; encounter routed to appropriate provider.  Cindy Claw,RN 06/26/2020  3:20 PM

## 2020-06-26 NOTE — Patient Instructions (Addendum)
Meet the Provider Zoom Sessions      [facility] is now offering FREE monthly 1-hour virtual Zoom sessions for new, current, and prospective patients.        During these sessions, you can:   Learn about our practice, model of care, services   Get answers to questions about pregnancy and birth during COVID   Pick your provider's brain about anything else!    Sessions will be hosted by [facility] Nurse Practitioners, Physician Assistants, Physicians and Midwives          No registration required      2021 Dates:      All at 6pm     October 21st     November 18th   December 16th     January 20th  February 17th    To join one of these meetings, a few minutes before it is set to start:     Copy/paste the link into your web browser:  [email or web address]    OR  Scan the QR code below (open up your camera and point towards QR code; click on tab that pops up on your phone ("zoom")      - At our [facility], we work as an integrated team, providing care to address both physical and emotional health. Your medical provider may refer you to see our Behavioral Health Clinician (BHC) on the same day you see your medical provider, as availability permits; often scheduled virtually at your convenience.  Our BHC is available to all patients, visits generally last between 20-30 minutes, but can be longer or shorter, depending on patient need. The BHC offers help with stress management, coping with symptoms of depression and anxiety, major life changes , sleep issues, changing risky behavior, grief and loss, life stress, working on personal life goals, and  behavioral health issues, as these all affect your overall health and wellness.  The BHC is NOT available for the following: FMLA paperwork, court-ordered evaluations, specialty assessments (custody or disability),  letters to employers, or obtaining certification for an emotional support animal. The BHC does not provide long-term therapy. You have the right to refuse integrated behavioral health services, or to reschedule to see the BHC at a later date.  Confidentiality exception: If it is suspected that a child or disabled adult is being abused or neglected, we are required by law to report that to either Child Protective Services or Adult Protective Services.  If you have a diagnosis of Bipolar affective disorder, Schizophrenia, or recurrent Major depressive disorder, we will recommend that you establish care with a psychiatrist, as these are lifelong, chronic conditions, and we want your overall emotional health and medications to be more closely monitored. If you anticipate needing extended maternity leave due to mental health issues postpartum, it it recommended you inform your medical provider, so we can put in a referral to a  psychiatrist as soon as possible. The BHC is unable to recommend an extended maternity leave for mental health issues. Your medical provider or BHC may refer you to a therapist for ongoing, traditional therapy, or to a psychiatrist, for medication management, if it would benefit your overall health. Depending on your insurance, you may have a copay to see the BHC. If you are uninsured, it is recommended that you apply for financial assistance. (Forms may be requested at the front desk for in-person visits, via MyChart, or request a form during a virtual visit).  If you   see the BHC more than 6 times, you will have to complete a comprehensive clinical assessment interview with the BHC to resume integrated services.  For virtual visits with the BHC, you must be physically in the state of [place] at the time of the visit. For example, if you live in [place], you will have to do an in-person visit with the BHC, and your out-of-state insurance may not cover behavioral health services in  [place]. f you are going out of the state or country for any reason, the BHC may see you virtually when you return to [place], but not while you are physically outside of .   

## 2020-07-05 ENCOUNTER — Other Ambulatory Visit: Payer: Self-pay

## 2020-07-05 ENCOUNTER — Encounter: Payer: Self-pay | Admitting: Obstetrics & Gynecology

## 2020-07-05 ENCOUNTER — Ambulatory Visit (INDEPENDENT_AMBULATORY_CARE_PROVIDER_SITE_OTHER): Payer: Medicaid Other | Admitting: Obstetrics & Gynecology

## 2020-07-05 ENCOUNTER — Other Ambulatory Visit (HOSPITAL_COMMUNITY)
Admission: RE | Admit: 2020-07-05 | Discharge: 2020-07-05 | Disposition: A | Discharge status: Home / Self Care | Payer: Medicaid Other | Source: Ambulatory Visit | Attending: Obstetrics & Gynecology | Admitting: Obstetrics & Gynecology

## 2020-07-05 VITALS — BP 115/65 | HR 79 | Wt 107.8 lb

## 2020-07-05 DIAGNOSIS — O09299 Supervision of pregnancy with other poor reproductive or obstetric history, unspecified trimester: Secondary | ICD-10-CM | POA: Diagnosis not present

## 2020-07-05 DIAGNOSIS — O09899 Supervision of other high risk pregnancies, unspecified trimester: Secondary | ICD-10-CM

## 2020-07-05 DIAGNOSIS — O0990 Supervision of high risk pregnancy, unspecified, unspecified trimester: Secondary | ICD-10-CM | POA: Insufficient documentation

## 2020-07-05 DIAGNOSIS — O09891 Supervision of other high risk pregnancies, first trimester: Secondary | ICD-10-CM | POA: Diagnosis not present

## 2020-07-05 DIAGNOSIS — Z8759 Personal history of other complications of pregnancy, childbirth and the puerperium: Secondary | ICD-10-CM | POA: Diagnosis not present

## 2020-07-05 MED ORDER — ASPIRIN EC 81 MG PO TBEC: 81.0000 mg | DELAYED_RELEASE_TABLET | Freq: Every day | ORAL | 11 refills | Status: DC

## 2020-07-05 MED ORDER — PROMETHAZINE HCL 25 MG PO TABS: 25.0000 mg | ORAL_TABLET | Freq: Four times a day (QID) | ORAL | 2 refills | Status: DC | PRN

## 2020-07-05 NOTE — Progress Notes (Unsigned)
Medicaid Home Form Completed-07/05/20

## 2020-07-05 NOTE — Patient Instructions (Signed)
AREA PEDIATRIC/FAMILY PRACTICE PHYSICIANS  [place] ([address]) . [facility] o Chambliss, MD; Eniola, MD; Hale, MD; Hensel, MD; McDiarmid, MD; McIntyer, MD; Tiena Manansala, MD; Walden, MD o [address] [place], [place] [address] o [phone] o Mon-Fri 8:30-12:30, 1:30-5:00 o Providers come to see babies at [facility] o Accepting Medicaid . [facility] o Limited providers who accept newborns: Koirala, MD; Morrow, MD; Wolters, MD o [address], [place], [place] [address] o [phone] o Mon-Fri 8:00-5:30 o Babies seen by providers at [facility] o Does NOT accept Medicaid o Please call early in hospitalization for appointment (limited availability)  . [facility] o Mulberry, MD o [address] [place], [place] [address] o [phone] o Mon, Tue, Thur, Fri 8:30-5:00, Wed 10:00-7:00 (closed 1-2pm) o Babies seen by [facility] providers o Accepting Medicaid . [facility] Pediatrician o Rubin, MD o [address], [place], [place] [address] o [phone] o Mon-Fri 8:30-5:00, Sat 8:30-12:00 o Provider comes to see babies at [facility] o Accepting Medicaid o Must have been referred from current patients or contacted office prior to delivery . [facility] ([facility]) o Brown, MD; Chandler, MD; Ettefagh, MD; Grant, MD; Lester, MD; McCormick, MD; McQueen, MD; Prose, MD; Simha, MD; Stanley, MD; Stryffeler, NP; Tebben, NP o [address], [place], [place] [address] o [phone] o Mon, Tue, Thur, Fri 8:30-5:30, Wed 9:30-5:30, Sat 8:30-12:30 o Babies seen by [facility] providers o Accepting Medicaid o Only accepting infants of first-time parents or siblings of current patients o Hospital discharge coordinator will make follow-up appointment . Jack Amos o [address],  [place], [place]  [address] o [phone]   Fax - [phone] . [facility] o [address], [place], [place]  [address] o Phone - [phone]   Fax - [phone] . Shilpa Gosrani o [address], [place], [place]  [address] o [phone]  [place] ([address]) . [facility] o Bates, MD; Brassfield, MD; Cooper, Cox, MD; MD; Davis, MD; Dovico, MD; Ettefaugh, MD; Little, MD; Lowe, MD; Keiffer, MD; Melvin, MD; Sumner, MD; [name], MD o [address], [place], [place] [address] o [phone] o Mon-Fri 8:30-5:00 (extended evenings Mon-Thur as needed), Sat-Sun 10:00-1:00 o Providers come to see babies at [facility] o Accepting Medicaid for families of first-time babies and families with all children in the household age 3 and under. Must register with office prior to making appointment (M-F only). . [facility] o Henson, NP; Knapp, MD; Lalonde, MD; Tysinger, PA o [address] [place], [place] [address] o [phone] o Mon-Fri 8:00-5:00 o Babies seen by providers at [facility] o Does NOT accept Medicaid/Commercial Insurance Only . [facility] ([facility]  o Artis, MD; Barnes, MD; Bratton, MD; Coccaro, MD; [place] Gardner, MD; Kramer, MD; Marshall, MD; Netherton, MD; Poleto, MD; Skinner, MD o [address] [place], [place] [address] o [phone] o Mon-Fri 8:30-5:30, Sat (Oct.-Mar.) 9:00-1:00 o Babies seen by providers at [facility] o Accepting Medicaid  [place] ([address]) . [facility] o Reid, MD; Warner, MD o [address], [place], [place] [address] o [phone] o Mon-Fri 8:30-5:00, Sat 8:30-12:00 o Providers come to see babies at [facility] o Does NOT accept Medicaid . [facility] o Becker, PA; Hagler, MD; Scifres, PA; Sun, MD; Swayne, MD o [address],  [place], [place] [address] o [phone] o Mon-Fri 8:00-5:00 o Babies seen by providers at [facility] o Does NOT accept Medicaid o Only accepting babies of parents who are patients o Please call early in hospitalization for appointment (limited availability) . [facility] o Clark, MD; Frye, MD; Kelleher, MD; Mack, NP; Miller, MD; O'Keller, MD; Patterson, NP; Pudlo, MD; Puzio, MD; Thomas, MD; Tucker, MD; Twiselton, MD o [address], [place], [place] [address] o [phone] o Mon-Fri 8:00-5:00, Sat 9:00-12:00 o Providers come to see babies at [facility] o Does NOT accept Medicaid  [place] ([address]) . [facility] o Limited providers accepting new patients: Brake, NP; Wharton, PA o [address], [place], [place] [address] o [phone] o Mon-Fri 8:00-5:00 o Babies seen by providers at [facility] o Does NOT accept Medicaid o Only accepting babies of parents who are patients o Please call early in hospitalization for appointment (limited availability) . [facility] o Gay, MD; Quinlan, MD o [address] [place], [place] [address] o [phone] (press [phone] to schedule appointment) o Mon-Fri 8:00-5:00 o Providers come to see babies at [facility] o Does NOT accept Medicaid . [facility] o Mazer, MD o [address] [place], [place] [address] o [phone] o Mon-Fri 8:30-5:00 (lunch 12:30-1:00), extended hours by appointment only Wed 5:00-6:30 o Babies seen by [facility] providers o Accepting Medicaid . [facility] o Banks, MD; Jordan, MD; Koberlein, MD o [address], [place], [place] [address] o [phone] o Mon-Fri 8:00-5:00 o Babies seen by [facility] providers o Does NOT accept Medicaid . [facility] o Parker, MD; Hunter, MD; Wallace, DO o [address] [place], [place]  [address] o [phone] o Mon-Fri 8:00-5:00 o Babies seen by [facility] providers o Does NOT accept Medicaid . [facility] o Brandon, PA; Brecken, PA; Christy, NP; Dees, MD; DeClaire, MD; DeWeese, MD; Hansen, NP; Mills, NP; Parrish, NP; Smoot, NP; Summer, MD; Vapne, MD o [address] [place], [place] [address] o [phone] o Mon-Fri 8:30-5:00, Sat 10:00-1:00 o Providers come to see babies at [facility] o Does NOT accept Medicaid o Free prenatal information session Tuesdays at 4:45pm . [facility] o Bouska, MD; Gordon, PA; Jeffery, PA; Weber, PA o [address] [place] [address] o [phone] o Mon-Fri 7:30-5:30 o Babies seen by [facility] providers . [facility] o [address], [place], [place]  [address] o [phone]   Fax - [phone]  [place] ([address] & [address]) . [facility] o [place]ese, MD o [address] [place], [place] [address] o [phone] o Mon-Thur 8:00-6:00 o Providers come to see babies at [facility] o Accepting Medicaid . [facility] o Anderson, NP; Badger, MD; Beal, PA; Spencer, PA o [address] [place], [place] [address] o [phone] o Mon-Thur 7:30-7:30, Fri 7:30-4:30 o Babies seen by [facility] providers o Accepting Medicaid . [facility] o Agbuya, MD; Klett, NP; Romgoolam, MD o [address], [place],  [address] o [phone] o Mon-Fri 8:30-5:00, Sat 8:30-12:00 o Providers come to see babies at [facility] o Accepting Medicaid o Must have "Meet & Greet" appointment at office prior to delivery . [facility] o McCord,   MD; Wallace, MD; Wood, MD o [address], [place], [place] [address] o [phone] o Mon-Wed 8:00-6:00, Thur-Fri 8:00-5:00, Sat 9:00-12:00 o Providers come to  see babies at [facility] o Does NOT accept Medicaid o Only accepting siblings of current patients . [facility]  o [address], [place], [place]  [address] o [phone]   Fax - [phone] . [facility] o [address], [place], [place]  [address] o [phone]   Fax - [phone]  [place] ([address] & [address]) . [facility] o Cirigliano, DO; Matthews, DO o [address] [place], [place] [address] o [phone] o Mon-Fri 7:00-5:00 o Babies seen by [facility] providers o Does NOT accept Medicaid . [facility] o Briscoe, MD; Howley, PA; Moreira, PA o [address], [place], [place] [address] o [phone] o Mon-Fri 8:00-5:00 o Babies seen by [facility] providers o Accepting Medicaid . [facility] o Boyd, MD; Church, PA; Jones, NP; Osborn, PA o [address], [place], [place] [address] o [phone] o Mon-Fri 8:00-5:00 o Babies seen by providers at [facility] o Accepting Medicaid  [place] ([address]) . [facility] [place] o Wendling, DO o [address] [place], [place] [address] o [phone] o Mon-Fri 8:00-5:00 o Babies seen by [facility] providers o Does NOT accept Medicaid o Limited availability, please call early in hospitalization to schedule follow-up . [facility] o Calderon, PA; Cummings, MD; Dillard, MD; Martin, PA; Olson, MD; VanDeven, PA o [address], [place], [place] [address] o [phone] o Mon-Fri 8:30-5:00, Sat 9:00-12:00 o Babies seen by providers at [facility] o Accepting Medicaid o Please register online then schedule online or call office o [email or web address] . [facility] o Hunter, NP; Kumar, MD; Martin Rogers, PA o [address], [place], [place] [address] o [phone] o Mon-Fri 8:00-5:00 o Babies seen by providers at [facility] o Accepting Medicaid . [facility] ([facility] o Lydia, MD; Kristi Fleenor, NP; West, MD o [address], [place], [place] [address] o [phone] o Mon-Fri 8:00-5:30, Sat&Sun by appointment (phones open at 8:30) o Babies seen by [facility] providers o Accepting Medicaid o Must be a first-time baby or sibling of current patient . [facility] [place]  o [address], [place], [place]  [address] o [phone]   Fax - [phone]  [place] ([address]) . [facility] o Brown, PA; Cowen, PA; Rice, MD; Helton, PA; Spry, MD o [address] [place], [place] [address] o [phone] o Mon-Thur 8:00-7:00, Fri 8:00-5:00, Sat 8:00-12:00, Sun 9:00-12:00 o Babies seen by [facility] providers o Accepting Medicaid . [facility] [place] o Coe-Goins, MD; Marshall, MD; Pierre-Louis, MD o [address] [place] [address], [place], [place] [address] o [phone] o Mon-Thur 8:00-5:00 o Babies seen by providers at [facility] o Accepting Medicaid . [facility] o Bratton, MD; Coe-Goins, MD; Hayes, MD; Lewis, MD; List, MD; Lott, MD; Marshall, MD; Moran, MD; O'Herlinda Heady, MD; Pierre-Louis, MD; Pitonzo, MD; Scholer, MD; Spangle, MD o [address] [place], [place]   [address] o [phone] o Mon-Fri 8:00-5:30, Sat (Oct.-Mar.) 9:00-1:00 o Babies seen by providers at [facility] o Accepting Medicaid o Must fill out new patient packet, available online at [email or web address] . [facility] ([facility] o Friddle, NP; Harris, NP; Kelly, NP; Logan, MD;  Melvin, PA; Poth, MD; Ramadoss, MD; Stanton, NP o [address], [place], [place] [address] o [phone] o Mon-Thur 8:00-5:30, Fri 8:00-5:00 o Babies seen by providers at [facility] o Accepting Medicaid  [place] ([address]) . [facility] o Dixon, PA; U[place], MD; Pickard, MD; Tapia, PA o [address], [place], [place] [address] o [phone] o Mon-Fri 8:00-5:00 o Babies seen by providers at [facility] o Accepting Medicaid   [place] ([address]) . [facility] [place] o Masneri, DO; Meyers, MD; Nelson, PA o [address], [place], [place] [address] o [phone] o Mon-Fri 8:00-5:00 o Babies seen by providers at [facility] o Does NOT accept Medicaid o Limited appointment availability, please call early in hospitalization  . [facility] [place] o Kunedd, DO; McGowen, MD o [address], [place], [place] [address] o [phone] o Mon-Fri 8:00-5:00 o Babies seen by [facility] providers o Does NOT accept Medicaid . [facility] [place] o Cameron, MD; MacDonald, MD; Michaels, PA; Nayak, MD o [address] [place] [address], [place], [place] [address] o [phone] o Mon-Fri 8:00-5:00 o After hours clinic ([address] [place], [place] [address]) [phone] Mon-Fri 5:00-8:00, Sat 12:00-6:00, Sun 10:00-4:00 o Babies seen by [facility] providers o Accepting Medicaid . [facility] [place] o [address], [place], [place]  [address] o [phone]   Fax - [phone]  [place] ([address]) . [facility] [place] [facility] o Andy, MD o [address], [place], [place] [address] o [phone] o Mon-Fri 8:00-5:00 o Babies seen by [facility] providers o Does NOT accept Medicaid . [facility] [place] ([facility] [place]) o Eksir, MD o [address], [place], [place]  [address] o [phone] o Mon-Thur 8:00-7:00, Fri 8:00-5:00, Sat 8:00-12:00 o Babies seen by providers at [facility] o Accepting Medicaid - but does not have vaccinations in office (must be received elsewhere) o Limited availability, please call early in hospitalization  [place] ([address]) . [facility]  o Charlene Flemming, MD o [address], [place] [address] o [phone]  Fax [phone]   

## 2020-07-05 NOTE — Progress Notes (Signed)
Subjective:    Cindy [name] is a [id number] [redacted]w[redacted]d being seen today for her first obstetrical visit.  Her obstetrical history is significant for pre-eclampsia. Patient does intend to breast feed. Pregnancy history fully reviewed.  Patient reports nausea.  Vitals:   07/05/20 0925  BP: 115/65  Pulse: 79  Weight: 107 lb 12.8 oz (48.9 kg)    HISTORY: OB History  Gravida Para Term Preterm AB Living  5 2 1 1 2 2   SAB IAB Ectopic Multiple Live Births  2 0 0 0 2    # Outcome Date GA Lbr Len/2nd Weight Sex Delivery Anes PTL Lv  5 Current           4 Preterm 09/10/19 [redacted]w[redacted]d 01:32 / 00:05 4 lb 5.1 oz (1.96 kg) F Vag-Spont EPI  LIV     Birth Comments: severe preeclampsia, IUGR  3 SAB 2020          2 Term 05/31/16 [redacted]w[redacted]d 16:30 / 06:05 6 lb 12.3 oz (3.07 kg) F Vag-Spont EPI  LIV  1 SAB 2016           Past Medical History:  Diagnosis Date  . Asthma   . Heart murmur   . Infection    UTI  . Kidney stone   . Kidney stones   . Preeclampsia    Past Surgical History:  Procedure Laterality Date  . NO PAST SURGERIES     Family History  Problem Relation Age of Onset  . Healthy Mother   . Healthy Father      Exam    Uterus:     Pelvic Exam:    Perineum: No Hemorrhoids   Vulva: normal   Vagina:  normal mucosa   pH:     Cervix: no lesions   Adnexa: normal adnexa   Bony Pelvis: average  System: Breast:  normal appearance, no masses or tenderness   Skin: normal coloration and turgor, no rashes    Neurologic: oriented, normal mood   Extremities: normal strength, tone, and muscle mass   HEENT PERRLA and neck supple with midline trachea   Mouth/Teeth mucous membranes moist, pharynx normal without lesions and dental hygiene good   Neck supple   Cardiovascular: regular rate and rhythm, no murmurs or gallops   Respiratory:  appears well, vitals normal, no respiratory distress, acyanotic, normal RR, neck free of mass or lymphadenopathy, chest clear, no wheezing, crepitations,  rhonchi, normal symmetric air entry   Abdomen: soft, non-tender; bowel sounds normal; no masses,  no organomegaly   Urinary: urethral meatus normal      Assessment:    Pregnancy: 2017 Patient Active Problem List   Diagnosis Date Noted  . Supervision of high risk pregnancy, antepartum 06/26/2020  . History of pre-eclampsia in prior pregnancy, currently pregnant 06/26/2020  . Short interval between pregnancies affecting pregnancy, antepartum 06/26/2020  . History of prior pregnancy with IUGR newborn 06/26/2020  . 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 TINEA VERSICOLOR 08/21/2010  . 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 DYSPNEA ON EXERTION 08/21/2010  . ANEMIA 03/31/2007  . MURMUR 03/31/2007  . CHEST WALL PAIN, HX OF 03/31/2007        Plan:     Initial labs drawn. Prenatal vitamins. Problem list reviewed and updated. Genetic Screening discussed ordered.  Ultrasound discussed; fetal survey: ordered.  Follow up in 4 weeks. 50% of 30 min visit spent on counseling and coordination of care.  ASA 81 mg daily, AFP NV   05/31/2007  07/05/2020  

## 2020-07-06 LAB — CYTOLOGY - PAP
Chlamydia: NEGATIVE
Comment: NEGATIVE
Comment: NORMAL
Diagnosis: NEGATIVE
Neisseria Gonorrhea: NEGATIVE

## 2020-07-06 LAB — COMPREHENSIVE METABOLIC PANEL
ALT: 18 IU/L (ref 0–32)
AST: 21 IU/L (ref 0–40)
Albumin/Globulin Ratio: 1.5 (ref 1.2–2.2)
Albumin: 4.6 g/dL (ref 3.9–5.0)
Alkaline Phosphatase: 55 IU/L (ref 44–121)
BUN/Creatinine Ratio: 10 (ref 9–23)
BUN: 7 mg/dL (ref 6–20)
Bilirubin Total: 0.3 mg/dL (ref 0.0–1.2)
CO2: 21 mmol/L (ref 20–29)
Calcium: 9.6 mg/dL (ref 8.7–10.2)
Chloride: 99 mmol/L (ref 96–106)
Creatinine, Ser: 0.72 mg/dL (ref 0.57–1.00)
GFR calc Af Amer: 134 mL/min/{1.73_m2} (ref >59)
GFR calc non Af Amer: 116 mL/min/{1.73_m2} (ref >59)
Globulin, Total: 3 g/dL (ref 1.5–4.5)
Glucose: 85 mg/dL (ref 65–99)
Potassium: 3.6 mmol/L (ref 3.5–5.2)
Sodium: 137 mmol/L (ref 134–144)
Total Protein: 7.6 g/dL (ref 6.0–8.5)

## 2020-07-06 LAB — CBC/D/PLT+RPR+RH+ABO+RUB AB...
Antibody Screen: NEGATIVE
Basophils Absolute: 0 10*3/uL (ref 0.0–0.2)
Basos: 1 %
EOS (ABSOLUTE): 0 10*3/uL (ref 0.0–0.4)
Eos: 0 %
HCV Ab: <0.1 s/co ratio (ref 0.0–0.9)
HIV Screen 4th Generation wRfx: NONREACTIVE
Hematocrit: 35.2 % (ref 34.0–46.6)
Hemoglobin: 11.9 g/dL (ref 11.1–15.9)
Hepatitis B Surface Ag: NEGATIVE
Immature Grans (Abs): 0 10*3/uL (ref 0.0–0.1)
Immature Granulocytes: 0 %
Lymphocytes Absolute: 1.5 10*3/uL (ref 0.7–3.1)
Lymphs: 40 %
MCH: 31.4 pg (ref 26.6–33.0)
MCHC: 33.8 g/dL (ref 31.5–35.7)
MCV: 93 fL (ref 79–97)
Monocytes Absolute: 0.7 10*3/uL (ref 0.1–0.9)
Monocytes: 18 %
Neutrophils Absolute: 1.6 10*3/uL (ref 1.4–7.0)
Neutrophils: 41 %
Platelets: 285 10*3/uL (ref 150–450)
RBC: 3.79 x10E6/uL (ref 3.77–5.28)
RDW: 15.8 % — ABNORMAL HIGH (ref 11.7–15.4)
RPR Ser Ql: NONREACTIVE
Rh Factor: POSITIVE
Rubella Antibodies, IGG: 16.6 index (ref >0.99)
WBC: 3.8 10*3/uL (ref 3.4–10.8)

## 2020-07-06 LAB — PROTEIN / CREATININE RATIO, URINE
Creatinine, Urine: 212.9 mg/dL
Protein, Ur: 20.9 mg/dL
Protein/Creat Ratio: 98 mg/g creat (ref 0–200)

## 2020-07-06 LAB — TSH: TSH: 1.58 u[IU]/mL (ref 0.450–4.500)

## 2020-07-06 LAB — HEMOGLOBIN A1C
Est. average glucose Bld gHb Est-mCnc: 100 mg/dL
Hgb A1c MFr Bld: 5.1 % (ref 4.8–5.6)

## 2020-07-06 LAB — HCV INTERPRETATION

## 2020-07-07 LAB — CULTURE, OB URINE

## 2020-07-07 LAB — URINE CULTURE, OB REFLEX

## 2020-08-02 ENCOUNTER — Encounter: Payer: Self-pay | Admitting: Family Medicine

## 2020-08-02 ENCOUNTER — Ambulatory Visit (INDEPENDENT_AMBULATORY_CARE_PROVIDER_SITE_OTHER): Payer: Medicaid Other | Admitting: Family Medicine

## 2020-08-02 ENCOUNTER — Other Ambulatory Visit: Payer: Self-pay

## 2020-08-02 VITALS — BP 110/51 | HR 82 | Wt 112.7 lb

## 2020-08-02 DIAGNOSIS — O0990 Supervision of high risk pregnancy, unspecified, unspecified trimester: Secondary | ICD-10-CM

## 2020-08-02 DIAGNOSIS — O09299 Supervision of pregnancy with other poor reproductive or obstetric history, unspecified trimester: Secondary | ICD-10-CM

## 2020-08-02 DIAGNOSIS — B009 Herpesviral infection, unspecified: Secondary | ICD-10-CM

## 2020-08-02 DIAGNOSIS — O09899 Supervision of other high risk pregnancies, unspecified trimester: Secondary | ICD-10-CM | POA: Diagnosis not present

## 2020-08-02 NOTE — Progress Notes (Signed)
   Subjective:  Cindy [name] is a 25 y.o. [id number] at [redacted]w[redacted]d being seen today for ongoing prenatal care.  She is currently monitored for the following issues for this low-risk pregnancy and has ANEMIA; MURMUR; CHEST WALL PAIN, HX OF; TINEA VERSICOLOR; ACNE VULGARIS, MILD; DYSPNEA ON EXERTION; HSV-2 infection; Supervision of high risk pregnancy, antepartum; History of pre-eclampsia in prior pregnancy, currently pregnant; Short interval between pregnancies affecting pregnancy, antepartum; and History of prior pregnancy with IUGR newborn on their problem list.  Patient reports no complaints.  Contractions: Not present. Vag. Bleeding: None.  Movement: Present. Denies leaking of fluid.   The following portions of the patient's history were reviewed and updated as appropriate: allergies, current medications, past family history, past medical history, past social history, past surgical history and problem list. Problem list updated.  Objective:   Vitals:   08/02/20 1103  BP: (!) 110/51  Pulse: 82  Weight: 112 lb 11.2 oz (51.1 kg)    Fetal Status: Fetal Heart Rate (bpm): 145   Movement: Present     General:  Alert, oriented and cooperative. Patient is in no acute distress.  Skin: Skin is warm and dry. No rash noted.   Cardiovascular: Normal heart rate noted  Respiratory: Normal respiratory effort, no problems with respiration noted  Abdomen: Soft, gravid, appropriate for gestational age. Pain/Pressure: Absent     Pelvic: Vag. Bleeding: None     Cervical exam deferred        Extremities: Normal range of motion.  Edema: None  Mental Status: Normal mood and affect. Normal behavior. Normal judgment and thought content.   Urinalysis:      Assessment and Plan:  Pregnancy: [id number] at [redacted]w[redacted]d  1. Supervision of high risk pregnancy, antepartum BP and FHR normal Discussed contraception, urged to review bedsider and consider LARC as she does not desire more children in the near future - AFP,  Serum, Open Spina Bifida  2. Short interval between pregnancies affecting pregnancy, antepartum Last delivery 08/2019  3. HSV-2 infection Start prophylaxis at 34-36 weeks  4. History of pre-eclampsia in prior pregnancy, currently pregnant On prenatal ASA  Preterm labor symptoms and general obstetric precautions including but not limited to vaginal bleeding, contractions, leaking of fluid and fetal movement were reviewed in detail with the patient. Please refer to After Visit Summary for other counseling recommendations.  Return in 4 weeks (on 08/30/2020).   Venora Maples, MD

## 2020-08-02 NOTE — Patient Instructions (Signed)
 Contraception Choices Contraception, also called birth control, refers to methods or devices that prevent pregnancy. Hormonal methods Contraceptive implant A contraceptive implant is a thin, plastic tube that contains a hormone that prevents pregnancy. It is different from an intrauterine device (IUD). It is inserted into the upper part of the arm by a health care provider. Implants can be effective for up to 3 years. Progestin-only injections Progestin-only injections are injections of progestin, a synthetic form of the hormone progesterone. They are given every 3 months by a health care provider. Birth control pills Birth control pills are pills that contain hormones that prevent pregnancy. They must be taken once a day, preferably at the same time each day. A prescription is needed to use this method of contraception. Birth control patch The birth control patch contains hormones that prevent pregnancy. It is placed on the skin and must be changed once a week for three weeks and removed on the fourth week. A prescription is needed to use this method of contraception. Vaginal ring A vaginal ring contains hormones that prevent pregnancy. It is placed in the vagina for three weeks and removed on the fourth week. After that, the process is repeated with a new ring. A prescription is needed to use this method of contraception. Emergency contraceptive Emergency contraceptives prevent pregnancy after unprotected sex. They come in pill form and can be taken up to 5 days after sex. They work best the sooner they are taken after having sex. Most emergency contraceptives are available without a prescription. This method should not be used as your only form of birth control.   Barrier methods Female condom A female condom is a thin sheath that is worn over the penis during sex. Condoms keep sperm from going inside a woman's body. They can be used with a sperm-killing substance (spermicide) to increase their  effectiveness. They should be thrown away after one use. Female condom A female condom is a soft, loose-fitting sheath that is put into the vagina before sex. The condom keeps sperm from going inside a woman's body. They should be thrown away after one use. Diaphragm A diaphragm is a soft, dome-shaped barrier. It is inserted into the vagina before sex, along with a spermicide. The diaphragm blocks sperm from entering the uterus, and the spermicide kills sperm. A diaphragm should be left in the vagina for 6-8 hours after sex and removed within 24 hours. A diaphragm is prescribed and fitted by a health care provider. A diaphragm should be replaced every 1-2 years, after giving birth, after gaining more than 15 lb (6.8 kg), and after pelvic surgery. Cervical cap A cervical cap is a round, soft latex or plastic cup that fits over the cervix. It is inserted into the vagina before sex, along with spermicide. It blocks sperm from entering the uterus. The cap should be left in place for 6-8 hours after sex and removed within 48 hours. A cervical cap must be prescribed and fitted by a health care provider. It should be replaced every 2 years. Sponge A sponge is a soft, circular piece of polyurethane foam with spermicide in it. The sponge helps block sperm from entering the uterus, and the spermicide kills sperm. To use it, you make it wet and then insert it into the vagina. It should be inserted before sex, left in for at least 6 hours after sex, and removed and thrown away within 30 hours. Spermicides Spermicides are chemicals that kill or block sperm from entering the   cervix and uterus. They can come as a cream, jelly, suppository, foam, or tablet. A spermicide should be inserted into the vagina with an applicator at least 10-15 minutes before sex to allow time for it to work. The process must be repeated every time you have sex. Spermicides do not require a prescription.   Intrauterine  contraception Intrauterine device (IUD) An IUD is a T-shaped device that is put in a woman's uterus. There are two types:  Hormone IUD.This type contains progestin, a synthetic form of the hormone progesterone. This type can stay in place for 3-5 years.  Copper IUD.This type is wrapped in copper wire. It can stay in place for 10 years. Permanent methods of contraception Female tubal ligation In this method, a woman's fallopian tubes are sealed, tied, or blocked during surgery to prevent eggs from traveling to the uterus. Hysteroscopic sterilization In this method, a small, flexible insert is placed into each fallopian tube. The inserts cause scar tissue to form in the fallopian tubes and block them, so sperm cannot reach an egg. The procedure takes about 3 months to be effective. Another form of birth control must be used during those 3 months. Female sterilization This is a procedure to tie off the tubes that carry sperm (vasectomy). After the procedure, the man can still ejaculate fluid (semen). Another form of birth control must be used for 3 months after the procedure. Natural planning methods Natural family planning In this method, a couple does not have sex on days when the woman could become pregnant. Calendar method In this method, the woman keeps track of the length of each menstrual cycle, identifies the days when pregnancy can happen, and does not have sex on those days. Ovulation method In this method, a couple avoids sex during ovulation. Symptothermal method This method involves not having sex during ovulation. The woman typically checks for ovulation by watching changes in her temperature and in the consistency of cervical mucus. Post-ovulation method In this method, a couple waits to have sex until after ovulation. Where to find more information  Centers for Disease Control and Prevention: www.cdc.gov Summary  Contraception, also called birth control, refers to methods or  devices that prevent pregnancy.  Hormonal methods of contraception include implants, injections, pills, patches, vaginal rings, and emergency contraceptives.  Barrier methods of contraception can include female condoms, female condoms, diaphragms, cervical caps, sponges, and spermicides.  There are two types of IUDs (intrauterine devices). An IUD can be put in a woman's uterus to prevent pregnancy for 3-5 years.  Permanent sterilization can be done through a procedure for males and females. Natural family planning methods involve nothaving sex on days when the woman could become pregnant. This information is not intended to replace advice given to you by your health care provider. Make sure you discuss any questions you have with your health care provider. Document Revised: 11/15/2019 Document Reviewed: 11/15/2019 Elsevier Patient Education  2021 [employer] Inc.   Breastfeeding  Choosing to breastfeed is one of the best decisions you can make for yourself and your baby. A change in hormones during pregnancy causes your breasts to make breast milk in your milk-producing glands. Hormones prevent breast milk from being released before your baby is born. They also prompt milk flow after birth. Once breastfeeding has begun, thoughts of your baby, as well as his or her sucking or crying, can stimulate the release of milk from your milk-producing glands. Benefits of breastfeeding Research shows that breastfeeding offers many health benefits   for infants and mothers. It also offers a cost-free and convenient way to feed your baby. For your baby  Your first milk (colostrum) helps your baby's digestive system to function better.  Special cells in your milk (antibodies) help your baby to fight off infections.  Breastfed babies are less likely to develop asthma, allergies, obesity, or type 2 diabetes. They are also at lower risk for sudden infant death syndrome (SIDS).  Nutrients in breast milk are better  able to meet your baby's needs compared to infant formula.  Breast milk improves your baby's brain development. For you  Breastfeeding helps to create a very special bond between you and your baby.  Breastfeeding is convenient. Breast milk costs nothing and is always available at the correct temperature.  Breastfeeding helps to burn calories. It helps you to lose the weight that you gained during pregnancy.  Breastfeeding makes your uterus return faster to its size before pregnancy. It also slows bleeding (lochia) after you give birth.  Breastfeeding helps to lower your risk of developing type 2 diabetes, osteoporosis, rheumatoid arthritis, cardiovascular disease, and breast, ovarian, uterine, and endometrial cancer later in life. Breastfeeding basics Starting breastfeeding  Find a comfortable place to sit or lie down, with your neck and back well-supported.  Place a pillow or a rolled-up blanket under your baby to bring him or her to the level of your breast (if you are seated). Nursing pillows are specially designed to help support your arms and your baby while you breastfeed.  Make sure that your baby's tummy (abdomen) is facing your abdomen.  Gently massage your breast. With your fingertips, massage from the outer edges of your breast inward toward the nipple. This encourages milk flow. If your milk flows slowly, you may need to continue this action during the feeding.  Support your breast with 4 fingers underneath and your thumb above your nipple (make the letter "C" with your hand). Make sure your fingers are well away from your nipple and your baby's mouth.  Stroke your baby's lips gently with your finger or nipple.  When your baby's mouth is open wide enough, quickly bring your baby to your breast, placing your entire nipple and as much of the areola as possible into your baby's mouth. The areola is the colored area around your nipple. ? More areola should be visible above your  baby's upper lip than below the lower lip. ? Your baby's lips should be opened and extended outward (flanged) to ensure an adequate, comfortable latch. ? Your baby's tongue should be between his or her lower gum and your breast.  Make sure that your baby's mouth is correctly positioned around your nipple (latched). Your baby's lips should create a seal on your breast and be turned out (everted).  It is common for your baby to suck about 2-3 minutes in order to start the flow of breast milk. Latching Teaching your baby how to latch onto your breast properly is very important. An improper latch can cause nipple pain, decreased milk supply, and poor weight gain in your baby. Also, if your baby is not latched onto your nipple properly, he or she may swallow some air during feeding. This can make your baby fussy. Burping your baby when you switch breasts during the feeding can help to get rid of the air. However, teaching your baby to latch on properly is still the best way to prevent fussiness from swallowing air while breastfeeding. Signs that your baby has successfully latched onto   your nipple  Silent tugging or silent sucking, without causing you pain. Infant's lips should be extended outward (flanged).  Swallowing heard between every 3-4 sucks once your milk has started to flow (after your let-down milk reflex occurs).  Muscle movement above and in front of his or her ears while sucking. Signs that your baby has not successfully latched onto your nipple  Sucking sounds or smacking sounds from your baby while breastfeeding.  Nipple pain. If you think your baby has not latched on correctly, slip your finger into the corner of your baby's mouth to break the suction and place it between your baby's gums. Attempt to start breastfeeding again. Signs of successful breastfeeding Signs from your baby  Your baby will gradually decrease the number of sucks or will completely stop sucking.  Your baby  will fall asleep.  Your baby's body will relax.  Your baby will retain a small amount of milk in his or her mouth.  Your baby will let go of your breast by himself or herself. Signs from you  Breasts that have increased in firmness, weight, and size 1-3 hours after feeding.  Breasts that are softer immediately after breastfeeding.  Increased milk volume, as well as a change in milk consistency and color by the fifth day of breastfeeding.  Nipples that are not sore, cracked, or bleeding. Signs that your baby is getting enough milk  Wetting at least 1-2 diapers during the first 24 hours after birth.  Wetting at least 5-6 diapers every 24 hours for the first week after birth. The urine should be clear or pale yellow by the age of 5 days.  Wetting 6-8 diapers every 24 hours as your baby continues to grow and develop.  At least 3 stools in a 24-hour period by the age of 5 days. The stool should be soft and yellow.  At least 3 stools in a 24-hour period by the age of 7 days. The stool should be seedy and yellow.  No loss of weight greater than 10% of birth weight during the first 3 days of life.  Average weight gain of 4-7 oz (113-198 g) per week after the age of 4 days.  Consistent daily weight gain by the age of 5 days, without weight loss after the age of 2 weeks. After a feeding, your baby may spit up a small amount of milk. This is normal. Breastfeeding frequency and duration Frequent feeding will help you make more milk and can prevent sore nipples and extremely full breasts (breast engorgement). Breastfeed when you feel the need to reduce the fullness of your breasts or when your baby shows signs of hunger. This is called "breastfeeding on demand." Signs that your baby is hungry include:  Increased alertness, activity, or restlessness.  Movement of the head from side to side.  Opening of the mouth when the corner of the mouth or cheek is stroked (rooting).  Increased  sucking sounds, smacking lips, cooing, sighing, or squeaking.  Hand-to-mouth movements and sucking on fingers or hands.  Fussing or crying. Avoid introducing a pacifier to your baby in the first 4-6 weeks after your baby is born. After this time, you may choose to use a pacifier. Research has shown that pacifier use during the first year of a baby's life decreases the risk of sudden infant death syndrome (SIDS). Allow your baby to feed on each breast as long as he or she wants. When your baby unlatches or falls asleep while feeding from the   first breast, offer the second breast. Because newborns are often sleepy in the first few weeks of life, you may need to awaken your baby to get him or her to feed. Breastfeeding times will vary from baby to baby. However, the following rules can serve as a guide to help you make sure that your baby is properly fed:  Newborns (babies 4 weeks of age or younger) may breastfeed every 1-3 hours.  Newborns should not go without breastfeeding for longer than 3 hours during the day or 5 hours during the night.  You should breastfeed your baby a minimum of 8 times in a 24-hour period. Breast milk pumping Pumping and storing breast milk allows you to make sure that your baby is exclusively fed your breast milk, even at times when you are unable to breastfeed. This is especially important if you go back to work while you are still breastfeeding, or if you are not able to be present during feedings. Your lactation consultant can help you find a method of pumping that works best for you and give you guidelines about how long it is safe to store breast milk.      Caring for your breasts while you breastfeed Nipples can become dry, cracked, and sore while breastfeeding. The following recommendations can help keep your breasts moisturized and healthy:  Avoid using soap on your nipples.  Wear a supportive bra designed especially for nursing. Avoid wearing underwire-style  bras or extremely tight bras (sports bras).  Air-dry your nipples for 3-4 minutes after each feeding.  Use only cotton bra pads to absorb leaked breast milk. Leaking of breast milk between feedings is normal.  Use lanolin on your nipples after breastfeeding. Lanolin helps to maintain your skin's normal moisture barrier. Pure lanolin is not harmful (not toxic) to your baby. You may also hand express a few drops of breast milk and gently massage that milk into your nipples and allow the milk to air-dry. In the first few weeks after giving birth, some women experience breast engorgement. Engorgement can make your breasts feel heavy, warm, and tender to the touch. Engorgement peaks within 3-5 days after you give birth. The following recommendations can help to ease engorgement:  Completely empty your breasts while breastfeeding or pumping. You may want to start by applying warm, moist heat (in the shower or with warm, water-soaked hand towels) just before feeding or pumping. This increases circulation and helps the milk flow. If your baby does not completely empty your breasts while breastfeeding, pump any extra milk after he or she is finished.  Apply ice packs to your breasts immediately after breastfeeding or pumping, unless this is too uncomfortable for you. To do this: ? Put ice in a plastic bag. ? Place a towel between your skin and the bag. ? Leave the ice on for 20 minutes, 2-3 times a day.  Make sure that your baby is latched on and positioned properly while breastfeeding. If engorgement persists after 48 hours of following these recommendations, contact your health care provider or a lactation consultant. Overall health care recommendations while breastfeeding  Eat 3 healthy meals and 3 snacks every day. Well-nourished mothers who are breastfeeding need an additional 450-500 calories a day. You can meet this requirement by increasing the amount of a balanced diet that you eat.  Drink  enough water to keep your urine pale yellow or clear.  Rest often, relax, and continue to take your prenatal vitamins to prevent fatigue, stress, and low   vitamin and mineral levels in your body (nutrient deficiencies).  Do not use any products that contain nicotine or tobacco, such as cigarettes and e-cigarettes. Your baby may be harmed by chemicals from cigarettes that pass into breast milk and exposure to secondhand smoke. If you need help quitting, ask your health care provider.  Avoid alcohol.  Do not use illegal drugs or marijuana.  Talk with your health care provider before taking any medicines. These include over-the-counter and prescription medicines as well as vitamins and herbal supplements. Some medicines that may be harmful to your baby can pass through breast milk.  It is possible to become pregnant while breastfeeding. If birth control is desired, ask your health care provider about options that will be safe while breastfeeding your baby. Where to find more information: [employer]: www.llli.org Contact a health care provider if:  You feel like you want to stop breastfeeding or have become frustrated with breastfeeding.  Your nipples are cracked or bleeding.  Your breasts are red, tender, or warm.  You have: ? Painful breasts or nipples. ? A swollen area on either breast. ? A fever or chills. ? Nausea or vomiting. ? Drainage other than breast milk from your nipples.  Your breasts do not become full before feedings by the fifth day after you give birth.  You feel sad and depressed.  Your baby is: ? Too sleepy to eat well. ? Having trouble sleeping. ? More than 1 week old and wetting fewer than 6 diapers in a 24-hour period. ? Not gaining weight by 5 days of age.  Your baby has fewer than 3 stools in a 24-hour period.  Your baby's skin or the white parts of his or her eyes become yellow. Get help right away if:  Your baby is overly tired  (lethargic) and does not want to wake up and feed.  Your baby develops an unexplained fever. Summary  Breastfeeding offers many health benefits for infant and mothers.  Try to breastfeed your infant when he or she shows early signs of hunger.  Gently tickle or stroke your baby's lips with your finger or nipple to allow the baby to open his or her mouth. Bring the baby to your breast. Make sure that much of the areola is in your baby's mouth. Offer one side and burp the baby before you offer the other side.  Talk with your health care provider or lactation consultant if you have questions or you face problems as you breastfeed. This information is not intended to replace advice given to you by your health care provider. Make sure you discuss any questions you have with your health care provider. Document Revised: 09/04/2017 Document Reviewed: 07/12/2016 Elsevier Patient Education  2021 Elsevier Inc.  

## 2020-08-11 LAB — AFP, SERUM, OPEN SPINA BIFIDA
AFP MoM: 1.46
AFP Value: 75.1 ng/mL
Gest. Age on Collection Date: 16.7 weeks
Maternal Age At EDD: 27 yr
OSBR Risk 1 IN: 6095
Test Results:: NEGATIVE
Weight: 107 [lb_av]

## 2020-08-18 ENCOUNTER — Ambulatory Visit: Payer: Medicaid Other | Attending: Obstetrics & Gynecology

## 2020-08-18 ENCOUNTER — Ambulatory Visit: Payer: Medicaid Other | Admitting: *Deleted

## 2020-08-18 ENCOUNTER — Encounter: Payer: Self-pay | Admitting: *Deleted

## 2020-08-18 ENCOUNTER — Other Ambulatory Visit: Payer: Self-pay

## 2020-08-18 ENCOUNTER — Other Ambulatory Visit: Payer: Self-pay | Admitting: *Deleted

## 2020-08-18 DIAGNOSIS — O09299 Supervision of pregnancy with other poor reproductive or obstetric history, unspecified trimester: Secondary | ICD-10-CM | POA: Diagnosis not present

## 2020-08-18 DIAGNOSIS — Z8759 Personal history of other complications of pregnancy, childbirth and the puerperium: Secondary | ICD-10-CM | POA: Diagnosis not present

## 2020-08-18 DIAGNOSIS — O0990 Supervision of high risk pregnancy, unspecified, unspecified trimester: Secondary | ICD-10-CM | POA: Insufficient documentation

## 2020-08-18 DIAGNOSIS — O09899 Supervision of other high risk pregnancies, unspecified trimester: Secondary | ICD-10-CM | POA: Diagnosis not present

## 2020-08-18 DIAGNOSIS — B009 Herpesviral infection, unspecified: Secondary | ICD-10-CM

## 2020-08-18 DIAGNOSIS — Z362 Encounter for other antenatal screening follow-up: Secondary | ICD-10-CM

## 2020-08-30 ENCOUNTER — Encounter: Payer: Self-pay | Admitting: Family Medicine

## 2020-08-30 ENCOUNTER — Ambulatory Visit (INDEPENDENT_AMBULATORY_CARE_PROVIDER_SITE_OTHER): Payer: Medicaid Other | Admitting: Family Medicine

## 2020-08-30 ENCOUNTER — Other Ambulatory Visit: Payer: Self-pay

## 2020-08-30 VITALS — BP 97/53 | HR 72 | Wt 116.4 lb

## 2020-08-30 DIAGNOSIS — B009 Herpesviral infection, unspecified: Secondary | ICD-10-CM

## 2020-08-30 DIAGNOSIS — O09899 Supervision of other high risk pregnancies, unspecified trimester: Secondary | ICD-10-CM | POA: Diagnosis not present

## 2020-08-30 DIAGNOSIS — O09299 Supervision of pregnancy with other poor reproductive or obstetric history, unspecified trimester: Secondary | ICD-10-CM | POA: Diagnosis not present

## 2020-08-30 DIAGNOSIS — Z8759 Personal history of other complications of pregnancy, childbirth and the puerperium: Secondary | ICD-10-CM

## 2020-08-30 DIAGNOSIS — O0990 Supervision of high risk pregnancy, unspecified, unspecified trimester: Secondary | ICD-10-CM

## 2020-08-30 NOTE — Progress Notes (Signed)
   Subjective:  Cindy [name] is a 25 y.o. [id number] at [redacted]w[redacted]d being seen today for ongoing prenatal care.  She is currently monitored for the following issues for this low-risk pregnancy and has ANEMIA; MURMUR; CHEST WALL PAIN, HX OF; TINEA VERSICOLOR; ACNE VULGARIS, MILD; DYSPNEA ON EXERTION; HSV-2 infection; Supervision of high risk pregnancy, antepartum; History of pre-eclampsia in prior pregnancy, currently pregnant; Short interval between pregnancies affecting pregnancy, antepartum; and History of prior pregnancy with IUGR newborn on their problem list.  Patient reports no complaints.  Contractions: Not present. Vag. Bleeding: None.  Movement: Present. Denies leaking of fluid.   The following portions of the patient's history were reviewed and updated as appropriate: allergies, current medications, past family history, past medical history, past social history, past surgical history and problem list. Problem list updated.  Objective:   Vitals:   08/30/20 1448  BP: (!) 97/53  Pulse: 72  Weight: 116 lb 6.4 oz (52.8 kg)    Fetal Status: Fetal Heart Rate (bpm): 154   Movement: Present     General:  Alert, oriented and cooperative. Patient is in no acute distress.  Skin: Skin is warm and dry. No rash noted.   Cardiovascular: Normal heart rate noted  Respiratory: Normal respiratory effort, no problems with respiration noted  Abdomen: Soft, gravid, appropriate for gestational age. Pain/Pressure: Absent     Pelvic: Vag. Bleeding: None     Cervical exam deferred        Extremities: Normal range of motion.  Edema: None  Mental Status: Normal mood and affect. Normal behavior. Normal judgment and thought content.   Urinalysis:      Assessment and Plan:  Pregnancy: [id number] at [redacted]w[redacted]d  1. Supervision of high risk pregnancy, antepartum BP and FHR normal Growth [place] normal on 2/25, repeat scheduled for 4/1 (cardiac views, hx of IUGR) Still deciding between nexplanon and IUD  2. HSV-2  infection Start prophylaxis at 34-36 wks, consider earlier given hx of PreE and possible early IOL  3. History of pre-eclampsia in prior pregnancy, currently pregnant Taking ASA  4. Short interval between pregnancies affecting pregnancy, antepartum Last delivery 08/2019  5. History of prior pregnancy with IUGR newborn Following w MFM  Preterm labor symptoms and general obstetric precautions including but not limited to vaginal bleeding, contractions, leaking of fluid and fetal movement were reviewed in detail with the patient. Please refer to After Visit Summary for other counseling recommendations.  Return in 4 weeks (on 09/27/2020).   Venora Maples, MD

## 2020-08-30 NOTE — Patient Instructions (Signed)
 Contraception Choices Contraception, also called birth control, refers to methods or devices that prevent pregnancy. Hormonal methods Contraceptive implant A contraceptive implant is a thin, plastic tube that contains a hormone that prevents pregnancy. It is different from an intrauterine device (IUD). It is inserted into the upper part of the arm by a health care provider. Implants can be effective for up to 3 years. Progestin-only injections Progestin-only injections are injections of progestin, a synthetic form of the hormone progesterone. They are given every 3 months by a health care provider. Birth control pills Birth control pills are pills that contain hormones that prevent pregnancy. They must be taken once a day, preferably at the same time each day. A prescription is needed to use this method of contraception. Birth control patch The birth control patch contains hormones that prevent pregnancy. It is placed on the skin and must be changed once a week for three weeks and removed on the fourth week. A prescription is needed to use this method of contraception. Vaginal ring A vaginal ring contains hormones that prevent pregnancy. It is placed in the vagina for three weeks and removed on the fourth week. After that, the process is repeated with a new ring. A prescription is needed to use this method of contraception. Emergency contraceptive Emergency contraceptives prevent pregnancy after unprotected sex. They come in pill form and can be taken up to 5 days after sex. They work best the sooner they are taken after having sex. Most emergency contraceptives are available without a prescription. This method should not be used as your only form of birth control.   Barrier methods Female condom A female condom is a thin sheath that is worn over the penis during sex. Condoms keep sperm from going inside a woman's body. They can be used with a sperm-killing substance (spermicide) to increase their  effectiveness. They should be thrown away after one use. Female condom A female condom is a soft, loose-fitting sheath that is put into the vagina before sex. The condom keeps sperm from going inside a woman's body. They should be thrown away after one use. Diaphragm A diaphragm is a soft, dome-shaped barrier. It is inserted into the vagina before sex, along with a spermicide. The diaphragm blocks sperm from entering the uterus, and the spermicide kills sperm. A diaphragm should be left in the vagina for 6-8 hours after sex and removed within 24 hours. A diaphragm is prescribed and fitted by a health care provider. A diaphragm should be replaced every 1-2 years, after giving birth, after gaining more than 15 lb (6.8 kg), and after pelvic surgery. Cervical cap A cervical cap is a round, soft latex or plastic cup that fits over the cervix. It is inserted into the vagina before sex, along with spermicide. It blocks sperm from entering the uterus. The cap should be left in place for 6-8 hours after sex and removed within 48 hours. A cervical cap must be prescribed and fitted by a health care provider. It should be replaced every 2 years. Sponge A sponge is a soft, circular piece of polyurethane foam with spermicide in it. The sponge helps block sperm from entering the uterus, and the spermicide kills sperm. To use it, you make it wet and then insert it into the vagina. It should be inserted before sex, left in for at least 6 hours after sex, and removed and thrown away within 30 hours. Spermicides Spermicides are chemicals that kill or block sperm from entering the   cervix and uterus. They can come as a cream, jelly, suppository, foam, or tablet. A spermicide should be inserted into the vagina with an applicator at least 10-15 minutes before sex to allow time for it to work. The process must be repeated every time you have sex. Spermicides do not require a prescription.   Intrauterine  contraception Intrauterine device (IUD) An IUD is a T-shaped device that is put in a woman's uterus. There are two types:  Hormone IUD.This type contains progestin, a synthetic form of the hormone progesterone. This type can stay in place for 3-5 years.  Copper IUD.This type is wrapped in copper wire. It can stay in place for 10 years. Permanent methods of contraception Female tubal ligation In this method, a woman's fallopian tubes are sealed, tied, or blocked during surgery to prevent eggs from traveling to the uterus. Hysteroscopic sterilization In this method, a small, flexible insert is placed into each fallopian tube. The inserts cause scar tissue to form in the fallopian tubes and block them, so sperm cannot reach an egg. The procedure takes about 3 months to be effective. Another form of birth control must be used during those 3 months. Female sterilization This is a procedure to tie off the tubes that carry sperm (vasectomy). After the procedure, the man can still ejaculate fluid (semen). Another form of birth control must be used for 3 months after the procedure. Natural planning methods Natural family planning In this method, a couple does not have sex on days when the woman could become pregnant. Calendar method In this method, the woman keeps track of the length of each menstrual cycle, identifies the days when pregnancy can happen, and does not have sex on those days. Ovulation method In this method, a couple avoids sex during ovulation. Symptothermal method This method involves not having sex during ovulation. The woman typically checks for ovulation by watching changes in her temperature and in the consistency of cervical mucus. Post-ovulation method In this method, a couple waits to have sex until after ovulation. Where to find more information  Centers for Disease Control and Prevention: www.cdc.gov Summary  Contraception, also called birth control, refers to methods or  devices that prevent pregnancy.  Hormonal methods of contraception include implants, injections, pills, patches, vaginal rings, and emergency contraceptives.  Barrier methods of contraception can include female condoms, female condoms, diaphragms, cervical caps, sponges, and spermicides.  There are two types of IUDs (intrauterine devices). An IUD can be put in a woman's uterus to prevent pregnancy for 3-5 years.  Permanent sterilization can be done through a procedure for males and females. Natural family planning methods involve nothaving sex on days when the woman could become pregnant. This information is not intended to replace advice given to you by your health care provider. Make sure you discuss any questions you have with your health care provider. Document Revised: 11/15/2019 Document Reviewed: 11/15/2019 Elsevier Patient Education  2021 [employer] Inc.   Breastfeeding  Choosing to breastfeed is one of the best decisions you can make for yourself and your baby. A change in hormones during pregnancy causes your breasts to make breast milk in your milk-producing glands. Hormones prevent breast milk from being released before your baby is born. They also prompt milk flow after birth. Once breastfeeding has begun, thoughts of your baby, as well as his or her sucking or crying, can stimulate the release of milk from your milk-producing glands. Benefits of breastfeeding Research shows that breastfeeding offers many health benefits   for infants and mothers. It also offers a cost-free and convenient way to feed your baby. For your baby  Your first milk (colostrum) helps your baby's digestive system to function better.  Special cells in your milk (antibodies) help your baby to fight off infections.  Breastfed babies are less likely to develop asthma, allergies, obesity, or type 2 diabetes. They are also at lower risk for sudden infant death syndrome (SIDS).  Nutrients in breast milk are better  able to meet your baby's needs compared to infant formula.  Breast milk improves your baby's brain development. For you  Breastfeeding helps to create a very special bond between you and your baby.  Breastfeeding is convenient. Breast milk costs nothing and is always available at the correct temperature.  Breastfeeding helps to burn calories. It helps you to lose the weight that you gained during pregnancy.  Breastfeeding makes your uterus return faster to its size before pregnancy. It also slows bleeding (lochia) after you give birth.  Breastfeeding helps to lower your risk of developing type 2 diabetes, osteoporosis, rheumatoid arthritis, cardiovascular disease, and breast, ovarian, uterine, and endometrial cancer later in life. Breastfeeding basics Starting breastfeeding  Find a comfortable place to sit or lie down, with your neck and back well-supported.  Place a pillow or a rolled-up blanket under your baby to bring him or her to the level of your breast (if you are seated). Nursing pillows are specially designed to help support your arms and your baby while you breastfeed.  Make sure that your baby's tummy (abdomen) is facing your abdomen.  Gently massage your breast. With your fingertips, massage from the outer edges of your breast inward toward the nipple. This encourages milk flow. If your milk flows slowly, you may need to continue this action during the feeding.  Support your breast with 4 fingers underneath and your thumb above your nipple (make the letter "C" with your hand). Make sure your fingers are well away from your nipple and your baby's mouth.  Stroke your baby's lips gently with your finger or nipple.  When your baby's mouth is open wide enough, quickly bring your baby to your breast, placing your entire nipple and as much of the areola as possible into your baby's mouth. The areola is the colored area around your nipple. ? More areola should be visible above your  baby's upper lip than below the lower lip. ? Your baby's lips should be opened and extended outward (flanged) to ensure an adequate, comfortable latch. ? Your baby's tongue should be between his or her lower gum and your breast.  Make sure that your baby's mouth is correctly positioned around your nipple (latched). Your baby's lips should create a seal on your breast and be turned out (everted).  It is common for your baby to suck about 2-3 minutes in order to start the flow of breast milk. Latching Teaching your baby how to latch onto your breast properly is very important. An improper latch can cause nipple pain, decreased milk supply, and poor weight gain in your baby. Also, if your baby is not latched onto your nipple properly, he or she may swallow some air during feeding. This can make your baby fussy. Burping your baby when you switch breasts during the feeding can help to get rid of the air. However, teaching your baby to latch on properly is still the best way to prevent fussiness from swallowing air while breastfeeding. Signs that your baby has successfully latched onto   your nipple  Silent tugging or silent sucking, without causing you pain. Infant's lips should be extended outward (flanged).  Swallowing heard between every 3-4 sucks once your milk has started to flow (after your let-down milk reflex occurs).  Muscle movement above and in front of his or her ears while sucking. Signs that your baby has not successfully latched onto your nipple  Sucking sounds or smacking sounds from your baby while breastfeeding.  Nipple pain. If you think your baby has not latched on correctly, slip your finger into the corner of your baby's mouth to break the suction and place it between your baby's gums. Attempt to start breastfeeding again. Signs of successful breastfeeding Signs from your baby  Your baby will gradually decrease the number of sucks or will completely stop sucking.  Your baby  will fall asleep.  Your baby's body will relax.  Your baby will retain a small amount of milk in his or her mouth.  Your baby will let go of your breast by himself or herself. Signs from you  Breasts that have increased in firmness, weight, and size 1-3 hours after feeding.  Breasts that are softer immediately after breastfeeding.  Increased milk volume, as well as a change in milk consistency and color by the fifth day of breastfeeding.  Nipples that are not sore, cracked, or bleeding. Signs that your baby is getting enough milk  Wetting at least 1-2 diapers during the first 24 hours after birth.  Wetting at least 5-6 diapers every 24 hours for the first week after birth. The urine should be clear or pale yellow by the age of 5 days.  Wetting 6-8 diapers every 24 hours as your baby continues to grow and develop.  At least 3 stools in a 24-hour period by the age of 5 days. The stool should be soft and yellow.  At least 3 stools in a 24-hour period by the age of 7 days. The stool should be seedy and yellow.  No loss of weight greater than 10% of birth weight during the first 3 days of life.  Average weight gain of 4-7 oz (113-198 g) per week after the age of 4 days.  Consistent daily weight gain by the age of 5 days, without weight loss after the age of 2 weeks. After a feeding, your baby may spit up a small amount of milk. This is normal. Breastfeeding frequency and duration Frequent feeding will help you make more milk and can prevent sore nipples and extremely full breasts (breast engorgement). Breastfeed when you feel the need to reduce the fullness of your breasts or when your baby shows signs of hunger. This is called "breastfeeding on demand." Signs that your baby is hungry include:  Increased alertness, activity, or restlessness.  Movement of the head from side to side.  Opening of the mouth when the corner of the mouth or cheek is stroked (rooting).  Increased  sucking sounds, smacking lips, cooing, sighing, or squeaking.  Hand-to-mouth movements and sucking on fingers or hands.  Fussing or crying. Avoid introducing a pacifier to your baby in the first 4-6 weeks after your baby is born. After this time, you may choose to use a pacifier. Research has shown that pacifier use during the first year of a baby's life decreases the risk of sudden infant death syndrome (SIDS). Allow your baby to feed on each breast as long as he or she wants. When your baby unlatches or falls asleep while feeding from the   first breast, offer the second breast. Because newborns are often sleepy in the first few weeks of life, you may need to awaken your baby to get him or her to feed. Breastfeeding times will vary from baby to baby. However, the following rules can serve as a guide to help you make sure that your baby is properly fed:  Newborns (babies 4 weeks of age or younger) may breastfeed every 1-3 hours.  Newborns should not go without breastfeeding for longer than 3 hours during the day or 5 hours during the night.  You should breastfeed your baby a minimum of 8 times in a 24-hour period. Breast milk pumping Pumping and storing breast milk allows you to make sure that your baby is exclusively fed your breast milk, even at times when you are unable to breastfeed. This is especially important if you go back to work while you are still breastfeeding, or if you are not able to be present during feedings. Your lactation consultant can help you find a method of pumping that works best for you and give you guidelines about how long it is safe to store breast milk.      Caring for your breasts while you breastfeed Nipples can become dry, cracked, and sore while breastfeeding. The following recommendations can help keep your breasts moisturized and healthy:  Avoid using soap on your nipples.  Wear a supportive bra designed especially for nursing. Avoid wearing underwire-style  bras or extremely tight bras (sports bras).  Air-dry your nipples for 3-4 minutes after each feeding.  Use only cotton bra pads to absorb leaked breast milk. Leaking of breast milk between feedings is normal.  Use lanolin on your nipples after breastfeeding. Lanolin helps to maintain your skin's normal moisture barrier. Pure lanolin is not harmful (not toxic) to your baby. You may also hand express a few drops of breast milk and gently massage that milk into your nipples and allow the milk to air-dry. In the first few weeks after giving birth, some women experience breast engorgement. Engorgement can make your breasts feel heavy, warm, and tender to the touch. Engorgement peaks within 3-5 days after you give birth. The following recommendations can help to ease engorgement:  Completely empty your breasts while breastfeeding or pumping. You may want to start by applying warm, moist heat (in the shower or with warm, water-soaked hand towels) just before feeding or pumping. This increases circulation and helps the milk flow. If your baby does not completely empty your breasts while breastfeeding, pump any extra milk after he or she is finished.  Apply ice packs to your breasts immediately after breastfeeding or pumping, unless this is too uncomfortable for you. To do this: ? Put ice in a plastic bag. ? Place a towel between your skin and the bag. ? Leave the ice on for 20 minutes, 2-3 times a day.  Make sure that your baby is latched on and positioned properly while breastfeeding. If engorgement persists after 48 hours of following these recommendations, contact your health care provider or a lactation consultant. Overall health care recommendations while breastfeeding  Eat 3 healthy meals and 3 snacks every day. Well-nourished mothers who are breastfeeding need an additional 450-500 calories a day. You can meet this requirement by increasing the amount of a balanced diet that you eat.  Drink  enough water to keep your urine pale yellow or clear.  Rest often, relax, and continue to take your prenatal vitamins to prevent fatigue, stress, and low   vitamin and mineral levels in your body (nutrient deficiencies).  Do not use any products that contain nicotine or tobacco, such as cigarettes and e-cigarettes. Your baby may be harmed by chemicals from cigarettes that pass into breast milk and exposure to secondhand smoke. If you need help quitting, ask your health care provider.  Avoid alcohol.  Do not use illegal drugs or marijuana.  Talk with your health care provider before taking any medicines. These include over-the-counter and prescription medicines as well as vitamins and herbal supplements. Some medicines that may be harmful to your baby can pass through breast milk.  It is possible to become pregnant while breastfeeding. If birth control is desired, ask your health care provider about options that will be safe while breastfeeding your baby. Where to find more information: [employer]: www.llli.org Contact a health care provider if:  You feel like you want to stop breastfeeding or have become frustrated with breastfeeding.  Your nipples are cracked or bleeding.  Your breasts are red, tender, or warm.  You have: ? Painful breasts or nipples. ? A swollen area on either breast. ? A fever or chills. ? Nausea or vomiting. ? Drainage other than breast milk from your nipples.  Your breasts do not become full before feedings by the fifth day after you give birth.  You feel sad and depressed.  Your baby is: ? Too sleepy to eat well. ? Having trouble sleeping. ? More than 1 week old and wetting fewer than 6 diapers in a 24-hour period. ? Not gaining weight by 5 days of age.  Your baby has fewer than 3 stools in a 24-hour period.  Your baby's skin or the white parts of his or her eyes become yellow. Get help right away if:  Your baby is overly tired  (lethargic) and does not want to wake up and feed.  Your baby develops an unexplained fever. Summary  Breastfeeding offers many health benefits for infant and mothers.  Try to breastfeed your infant when he or she shows early signs of hunger.  Gently tickle or stroke your baby's lips with your finger or nipple to allow the baby to open his or her mouth. Bring the baby to your breast. Make sure that much of the areola is in your baby's mouth. Offer one side and burp the baby before you offer the other side.  Talk with your health care provider or lactation consultant if you have questions or you face problems as you breastfeed. This information is not intended to replace advice given to you by your health care provider. Make sure you discuss any questions you have with your health care provider. Document Revised: 09/04/2017 Document Reviewed: 07/12/2016 Elsevier Patient Education  2021 Elsevier Inc.  

## 2020-09-11 ENCOUNTER — Encounter: Payer: Self-pay | Admitting: *Deleted

## 2020-09-22 ENCOUNTER — Other Ambulatory Visit: Payer: Self-pay

## 2020-09-22 ENCOUNTER — Ambulatory Visit: Payer: Medicaid Other | Attending: Obstetrics and Gynecology

## 2020-09-22 ENCOUNTER — Ambulatory Visit: Payer: Medicaid Other | Admitting: *Deleted

## 2020-09-22 ENCOUNTER — Encounter: Payer: Self-pay | Admitting: *Deleted

## 2020-09-22 ENCOUNTER — Other Ambulatory Visit: Payer: Self-pay | Admitting: Obstetrics and Gynecology

## 2020-09-22 DIAGNOSIS — Z362 Encounter for other antenatal screening follow-up: Secondary | ICD-10-CM | POA: Insufficient documentation

## 2020-09-22 DIAGNOSIS — B009 Herpesviral infection, unspecified: Secondary | ICD-10-CM | POA: Insufficient documentation

## 2020-09-22 DIAGNOSIS — O365920 Maternal care for other known or suspected poor fetal growth, second trimester, not applicable or unspecified: Secondary | ICD-10-CM | POA: Insufficient documentation

## 2020-09-22 DIAGNOSIS — Z8759 Personal history of other complications of pregnancy, childbirth and the puerperium: Secondary | ICD-10-CM | POA: Diagnosis not present

## 2020-09-22 DIAGNOSIS — O0990 Supervision of high risk pregnancy, unspecified, unspecified trimester: Secondary | ICD-10-CM

## 2020-09-22 DIAGNOSIS — O09299 Supervision of pregnancy with other poor reproductive or obstetric history, unspecified trimester: Secondary | ICD-10-CM | POA: Insufficient documentation

## 2020-09-22 DIAGNOSIS — O09899 Supervision of other high risk pregnancies, unspecified trimester: Secondary | ICD-10-CM

## 2020-09-22 DIAGNOSIS — Z3A24 24 weeks gestation of pregnancy: Secondary | ICD-10-CM | POA: Insufficient documentation

## 2020-09-22 NOTE — Procedures (Signed)
Cindy [name] [birth year] [redacted]w[redacted]d  Fetus A Non-Stress Test Interpretation for 09/22/20  Indication: IUGR  Fetal Heart Rate A Mode: External Baseline Rate (A): 155 bpm Variability: Minimal,Moderate Accelerations: None Decelerations: Variable,Prolonged Multiple birth?: No  Uterine Activity Mode: Palpation,Toco Contraction Frequency (min): none Resting Tone Palpated: Relaxed Resting Time: Adequate  Interpretation (Fetal Testing) Nonstress Test Interpretation: Non-reactive Overall Impression: Non-reassuring Comments: Dr. Grace Bushy reviewed tracing. Patient sent to [facility] for additional monitoring.

## 2020-09-25 ENCOUNTER — Other Ambulatory Visit: Payer: Self-pay | Admitting: *Deleted

## 2020-09-25 DIAGNOSIS — O365990 Maternal care for other known or suspected poor fetal growth, unspecified trimester, not applicable or unspecified: Secondary | ICD-10-CM

## 2020-09-26 ENCOUNTER — Ambulatory Visit: Payer: Medicaid Other

## 2020-09-27 ENCOUNTER — Encounter: Payer: Medicaid Other | Admitting: Family Medicine

## 2020-09-28 ENCOUNTER — Ambulatory Visit: Payer: Medicaid Other

## 2020-09-29 ENCOUNTER — Ambulatory Visit: Payer: Medicaid Other

## 2020-09-29 ENCOUNTER — Ambulatory Visit: Payer: Medicaid Other | Attending: Maternal & Fetal Medicine

## 2020-10-04 ENCOUNTER — Encounter: Payer: Self-pay | Admitting: *Deleted

## 2020-10-04 ENCOUNTER — Other Ambulatory Visit: Payer: Self-pay | Admitting: Maternal & Fetal Medicine

## 2020-10-04 ENCOUNTER — Other Ambulatory Visit: Payer: Self-pay

## 2020-10-04 ENCOUNTER — Ambulatory Visit: Payer: Medicaid Other | Attending: Maternal & Fetal Medicine | Admitting: *Deleted

## 2020-10-04 ENCOUNTER — Ambulatory Visit (HOSPITAL_BASED_OUTPATIENT_CLINIC_OR_DEPARTMENT_OTHER): Payer: Medicaid Other

## 2020-10-04 ENCOUNTER — Ambulatory Visit: Payer: Medicaid Other | Admitting: *Deleted

## 2020-10-04 DIAGNOSIS — O365990 Maternal care for other known or suspected poor fetal growth, unspecified trimester, not applicable or unspecified: Secondary | ICD-10-CM | POA: Insufficient documentation

## 2020-10-04 DIAGNOSIS — O09892 Supervision of other high risk pregnancies, second trimester: Secondary | ICD-10-CM

## 2020-10-04 DIAGNOSIS — O365921 Maternal care for other known or suspected poor fetal growth, second trimester, fetus 1: Secondary | ICD-10-CM

## 2020-10-04 DIAGNOSIS — O99012 Anemia complicating pregnancy, second trimester: Secondary | ICD-10-CM | POA: Insufficient documentation

## 2020-10-04 DIAGNOSIS — O09899 Supervision of other high risk pregnancies, unspecified trimester: Secondary | ICD-10-CM

## 2020-10-04 DIAGNOSIS — O4702 False labor before 37 completed weeks of gestation, second trimester: Secondary | ICD-10-CM | POA: Diagnosis not present

## 2020-10-04 DIAGNOSIS — O09292 Supervision of pregnancy with other poor reproductive or obstetric history, second trimester: Secondary | ICD-10-CM | POA: Diagnosis not present

## 2020-10-04 DIAGNOSIS — D649 Anemia, unspecified: Secondary | ICD-10-CM

## 2020-10-04 DIAGNOSIS — O09299 Supervision of pregnancy with other poor reproductive or obstetric history, unspecified trimester: Secondary | ICD-10-CM

## 2020-10-04 DIAGNOSIS — Z3A25 25 weeks gestation of pregnancy: Secondary | ICD-10-CM

## 2020-10-04 DIAGNOSIS — Z8759 Personal history of other complications of pregnancy, childbirth and the puerperium: Secondary | ICD-10-CM

## 2020-10-04 DIAGNOSIS — B009 Herpesviral infection, unspecified: Secondary | ICD-10-CM

## 2020-10-04 DIAGNOSIS — O0990 Supervision of high risk pregnancy, unspecified, unspecified trimester: Secondary | ICD-10-CM

## 2020-10-04 NOTE — Procedures (Signed)
Cindy [name] [birth year] [redacted]w[redacted]d  Fetus A Non-Stress Test Interpretation for 10/04/20  Indication: Fetal Growth Restriction  Fetal Heart Rate A Mode: External Baseline Rate (A): 150 bpm Variability: Moderate Accelerations: 10 x 10 Decelerations: None Multiple birth?: No  Uterine Activity Mode: Palpation,Toco Contraction Frequency (min): ui Contraction Duration (sec): 10-20 Contraction Quality: Mild Resting Tone Palpated: Relaxed Resting Time: Adequate  Interpretation (Fetal Testing) Nonstress Test Interpretation: Reactive Overall Impression: Reassuring for gestational age Comments: Dr. Parke Poisson reviewed tracing

## 2020-10-05 ENCOUNTER — Encounter: Payer: Medicaid Other | Admitting: Family Medicine

## 2020-10-05 ENCOUNTER — Encounter: Payer: Self-pay | Admitting: Family Medicine

## 2020-10-05 NOTE — Progress Notes (Signed)
Patient did not keep appointment today. She will be called to reschedule.  

## 2020-10-13 ENCOUNTER — Inpatient Hospital Stay (HOSPITAL_COMMUNITY)
Admission: AD | Admit: 2020-10-13 | Discharge: 2020-10-13 | Disposition: A | Discharge status: Home / Self Care | Payer: Medicaid Other | Attending: Obstetrics and Gynecology | Admitting: Obstetrics and Gynecology

## 2020-10-13 ENCOUNTER — Other Ambulatory Visit: Payer: Self-pay

## 2020-10-13 DIAGNOSIS — K0889 Other specified disorders of teeth and supporting structures: Secondary | ICD-10-CM

## 2020-10-13 DIAGNOSIS — O26892 Other specified pregnancy related conditions, second trimester: Secondary | ICD-10-CM | POA: Insufficient documentation

## 2020-10-13 DIAGNOSIS — B009 Herpesviral infection, unspecified: Secondary | ICD-10-CM

## 2020-10-13 DIAGNOSIS — O99612 Diseases of the digestive system complicating pregnancy, second trimester: Secondary | ICD-10-CM

## 2020-10-13 DIAGNOSIS — Z8759 Personal history of other complications of pregnancy, childbirth and the puerperium: Secondary | ICD-10-CM

## 2020-10-13 DIAGNOSIS — Z3A27 27 weeks gestation of pregnancy: Secondary | ICD-10-CM | POA: Insufficient documentation

## 2020-10-13 DIAGNOSIS — O09899 Supervision of other high risk pregnancies, unspecified trimester: Secondary | ICD-10-CM

## 2020-10-13 DIAGNOSIS — O0990 Supervision of high risk pregnancy, unspecified, unspecified trimester: Secondary | ICD-10-CM

## 2020-10-13 DIAGNOSIS — O09299 Supervision of pregnancy with other poor reproductive or obstetric history, unspecified trimester: Secondary | ICD-10-CM

## 2020-10-13 MED ORDER — AMOXICILLIN-POT CLAVULANATE 875-125 MG PO TABS: 1.0000 | ORAL_TABLET | Freq: Two times a day (BID) | ORAL | 0 refills | Status: AC

## 2020-10-13 NOTE — MAU Provider Note (Signed)
History     CSN: [id number]  Arrival date and time: 10/13/20 0754   Event Date/Time   First Provider Initiated Contact with Patient 10/13/20 [phone]      Chief Complaint  Patient presents with  . Tootheache   HPI Cindy [name] is a 25 y.o. [id number] at [redacted]w[redacted]d who presents to MAU with chief complaint of tooth pain on the right lower side of her mouth. She believes it is her wisdom tooth. She endorses tenderness along her jaw. Denies fever, states she is able to eat, drink and talk without difficulty.   This is a recurrent problem, onset " 6 months ago and bothering me my whole pregnancy".  Patient reports that she does have a dentist, has already provided a pregnancy care letter from her prenatal office and was most recently evaluated by her dentist 3 months ago.  On initial entrance of CNM to room, patient verbalizes she is on phone "calling out from work and not ready to be seen yet".  She denies pregnancy-related concerns including abdominal vaginal bleeding, leaking of fluid, decreased fetal movement, fever, falls, or recent illness.   Patient receives prenatal care with [facility].   OB History    Gravida  5   Para  2   Term  1   Preterm  1   AB  2   Living  2     SAB  2   IAB  0   Ectopic  0   Multiple  0   Live Births  2           Past Medical History:  Diagnosis Date  . Asthma   . Heart murmur   . Infection    UTI  . Kidney stone   . Kidney stones   . Preeclampsia     Past Surgical History:  Procedure Laterality Date  . NO PAST SURGERIES      Family History  Problem Relation Age of Onset  . Healthy Mother   . Healthy Father     Social History   Tobacco Use  . Smoking status: Never Smoker  . Smokeless tobacco: Never Used  Vaping Use  . Vaping Use: Never used  Substance Use Topics  . Alcohol use: No  . Drug use: Not Currently    Types: Marijuana    Comment: February 2021    Allergies:  Allergies  Allergen Reactions  .  Acetaminophen Anaphylaxis and Swelling    Throat swells, couldn't breath.    Medications Prior to Admission  Medication Sig Dispense Refill Last Dose  . albuterol (VENTOLIN HFA) 108 (90 Base) MCG/ACT inhaler Inhale 1 puff into the lungs every 6 (six) hours as needed for wheezing or shortness of breath.     Marland Kitchen aspirin EC 81 MG tablet Take 1 tablet (81 mg total) by mouth daily. Swallow whole. 30 tablet 11   . Prenatal Vit-Fe Fumarate-FA (PRENATAL MULTIVITAMIN) TABS tablet Take 1 tablet by mouth daily at 12 noon.     . promethazine (PHENERGAN) 25 MG tablet Take 1 tablet (25 mg total) by mouth every 6 (six) hours as needed for nausea or vomiting. 30 tablet 2     Review of Systems  Constitutional: Negative for fever.  HENT: Positive for dental problem. Negative for facial swelling and trouble swallowing.   All other systems reviewed and are negative.  Physical Exam   Blood pressure (!) 118/50, pulse 75, temperature 98.1 F (36.7 C), temperature source Oral, resp. rate 18, height  5\' 5"  (1.651 m), weight 57.5 kg, last menstrual period 04/07/2020, SpO2 100 %, unknown if currently breastfeeding.  Physical Exam Vitals and nursing note reviewed. Exam conducted with a chaperone present.  Constitutional:      Appearance: Normal appearance. She is not ill-appearing.  HENT:     Mouth/Throat:     Lips: Pink.     Mouth: Mucous membranes are moist.     Dentition: Dental caries present. No dental abscesses.     Pharynx: Oropharynx is clear. Uvula midline.  Cardiovascular:     Rate and Rhythm: Normal rate.     Pulses: Normal pulses.  Pulmonary:     Effort: Pulmonary effort is normal.  Abdominal:     Comments: Gravid  Lymphadenopathy:     Head:     Right side of head: No submental, submandibular or tonsillar adenopathy.     Left side of head: No submental, submandibular or tonsillar adenopathy.     Cervical: No cervical adenopathy.  Skin:    Capillary Refill: Capillary refill takes less than 2  seconds.  Neurological:     Mental Status: She is alert and oriented to person, place, and time.  Psychiatric:        Mood and Affect: Mood normal.        Behavior: Behavior normal.        Thought Content: Thought content normal.        Judgment: Judgment normal.     MAU Course  Procedures  --No evidence of abscess or active acute problem on exam, VSS --Recurrent issue, onset about 6 months ago per patient --Acetominophen allergy --Will initiate standard [facility] treatment for dental concerns --Patient has existing relationship with dentist, does not need referral  Meds ordered this encounter  Medications  . amoxicillin-clavulanate (AUGMENTIN) 875-125 MG tablet    Sig: Take 1 tablet by mouth 2 (two) times daily for 14 days.    Dispense:  28 tablet    Refill:  0    Order Specific Question:   Supervising Provider    Answer:   [facility] [[id number]]   Assessment and Plan  --25 y.o. 30 at [redacted]w[redacted]d  --FHT 148 by Doppler --No ob concerns --Discharge home in stable condition  F/U: --Patient to contact dentist for assessment of tooth  [redacted]w[redacted]d, CNM 10/13/2020, 5:25 PM

## 2020-10-13 NOTE — MAU Note (Signed)
Presents with tooth ache for couple days.  States it's located on the right side and it's her wisdom tooth.  Denies VB or LOF.  Endorses +FM.  Denies any pregnancy related issues.

## 2020-10-13 NOTE — Discharge Instructions (Signed)
Dental Caries, Adult  Dental caries are spots of decay (cavities) in teeth. They are in the outer layer of your tooth (enamel). Treat them as soon as you can. If they are not treated, they can spread decay and lead to painful infection. What are the causes? This condition is caused by acid that is produced when bacteria in your mouth break down sugary foods and liquids. What increases the risk? This condition is more likely to develop in people who:  Drink a lot of sugary liquids.  Eat a lot of sweets and carbohydrates.  Drink water that does not have fluoride.  Do not clean their teeth regularly.  Take medicines that decrease saliva. What are the signs or symptoms? Symptoms of this condition include:  White, brown, or black spots on the teeth.  Pain as the decay goes deeper into the tooth.  Swelling or bleeding in the gums. How is this treated? This condition is treated with a procedure to remove decay and to restore the tooth. Follow these instructions at home:  Take good care of your mouth and teeth. This keeps them healthy. ? Brush your teeth 2 times a day. Use toothpaste with fluoride in it. ? Floss your teeth once a day.  If your dentist prescribed an antibiotic medicine, take it as told. Do not stop taking the antibiotic even if your condition gets better.  Keep all follow-up visits as told by your dentist. This is important. This includes all cleanings. How is this prevented?  To prevent dental caries: ? Brush your teeth every morning and night. Use fluoride toothpaste. ? Floss your teeth once a day. ? Get regular dental cleanings. ? If told by your dentist:  Wash your mouth with prescription mouthwash (chlorhexidine).  Put topical fluoride on your teeth. ? Drink water with fluoride in it. ? Drink water instead of sugary drinks. ? Eat healthy meals and snacks. ? Have fluoride treatments in the dentist's office and sealants put on your teeth, if told by your  dentist. Contact a doctor if:  You have symptoms of tooth decay. Summary  Dental caries are spots of decay (cavities) in teeth. It is important to treat this as soon as you see them.  This condition is caused by an acid that comes when sugar breaks down in your mouth.  To prevent this condition, brush your teeth often and have regular dental cleanings.  Take an antibiotic to treat an infection, if told by your dentist. Do not stop taking the antibiotic even if your condition gets better.  Have regular dental cleanings and keep all follow-up visits. This information is not intended to replace advice given to you by your health care provider. Make sure you discuss any questions you have with your health care provider. Document Revised: 05/28/2019 Document Reviewed: 05/28/2019 Elsevier Patient Education  2021 Elsevier Inc.  

## 2020-10-20 ENCOUNTER — Other Ambulatory Visit: Payer: Self-pay

## 2020-10-20 ENCOUNTER — Ambulatory Visit: Payer: Medicaid Other | Attending: Obstetrics and Gynecology

## 2020-10-20 ENCOUNTER — Other Ambulatory Visit: Payer: Self-pay | Admitting: Maternal & Fetal Medicine

## 2020-10-20 ENCOUNTER — Other Ambulatory Visit: Payer: Self-pay | Admitting: *Deleted

## 2020-10-20 ENCOUNTER — Encounter: Payer: Self-pay | Admitting: *Deleted

## 2020-10-20 ENCOUNTER — Ambulatory Visit: Payer: Medicaid Other | Admitting: *Deleted

## 2020-10-20 DIAGNOSIS — O99013 Anemia complicating pregnancy, third trimester: Secondary | ICD-10-CM

## 2020-10-20 DIAGNOSIS — D649 Anemia, unspecified: Secondary | ICD-10-CM

## 2020-10-20 DIAGNOSIS — O365930 Maternal care for other known or suspected poor fetal growth, third trimester, not applicable or unspecified: Secondary | ICD-10-CM

## 2020-10-20 DIAGNOSIS — B009 Herpesviral infection, unspecified: Secondary | ICD-10-CM

## 2020-10-20 DIAGNOSIS — O09299 Supervision of pregnancy with other poor reproductive or obstetric history, unspecified trimester: Secondary | ICD-10-CM | POA: Diagnosis not present

## 2020-10-20 DIAGNOSIS — O0990 Supervision of high risk pregnancy, unspecified, unspecified trimester: Secondary | ICD-10-CM | POA: Diagnosis not present

## 2020-10-20 DIAGNOSIS — Z3A28 28 weeks gestation of pregnancy: Secondary | ICD-10-CM | POA: Diagnosis not present

## 2020-10-20 DIAGNOSIS — Z8759 Personal history of other complications of pregnancy, childbirth and the puerperium: Secondary | ICD-10-CM

## 2020-10-20 DIAGNOSIS — O09893 Supervision of other high risk pregnancies, third trimester: Secondary | ICD-10-CM | POA: Diagnosis not present

## 2020-10-20 DIAGNOSIS — O365990 Maternal care for other known or suspected poor fetal growth, unspecified trimester, not applicable or unspecified: Secondary | ICD-10-CM

## 2020-10-20 DIAGNOSIS — O09899 Supervision of other high risk pregnancies, unspecified trimester: Secondary | ICD-10-CM

## 2020-10-20 DIAGNOSIS — O09293 Supervision of pregnancy with other poor reproductive or obstetric history, third trimester: Secondary | ICD-10-CM

## 2020-10-25 ENCOUNTER — Ambulatory Visit: Payer: Medicaid Other | Admitting: *Deleted

## 2020-10-25 ENCOUNTER — Encounter: Payer: Self-pay | Admitting: *Deleted

## 2020-10-25 ENCOUNTER — Ambulatory Visit: Payer: Medicaid Other | Attending: Obstetrics

## 2020-10-25 ENCOUNTER — Other Ambulatory Visit: Payer: Self-pay

## 2020-10-25 DIAGNOSIS — O09299 Supervision of pregnancy with other poor reproductive or obstetric history, unspecified trimester: Secondary | ICD-10-CM | POA: Diagnosis not present

## 2020-10-25 DIAGNOSIS — O09899 Supervision of other high risk pregnancies, unspecified trimester: Secondary | ICD-10-CM | POA: Diagnosis not present

## 2020-10-25 DIAGNOSIS — O0990 Supervision of high risk pregnancy, unspecified, unspecified trimester: Secondary | ICD-10-CM | POA: Diagnosis not present

## 2020-10-25 DIAGNOSIS — B009 Herpesviral infection, unspecified: Secondary | ICD-10-CM | POA: Diagnosis not present

## 2020-10-25 DIAGNOSIS — Z8759 Personal history of other complications of pregnancy, childbirth and the puerperium: Secondary | ICD-10-CM | POA: Diagnosis not present

## 2020-10-25 DIAGNOSIS — O365990 Maternal care for other known or suspected poor fetal growth, unspecified trimester, not applicable or unspecified: Secondary | ICD-10-CM | POA: Insufficient documentation

## 2020-11-03 ENCOUNTER — Ambulatory Visit: Payer: Medicaid Other

## 2020-11-03 ENCOUNTER — Telehealth: Payer: Self-pay

## 2020-11-03 ENCOUNTER — Other Ambulatory Visit: Payer: Medicaid Other

## 2020-11-03 NOTE — Telephone Encounter (Signed)
Mar/sw patient-she is not coming in today and will come to her appointment next week on 11/10/2020.

## 2020-11-10 ENCOUNTER — Ambulatory Visit: Payer: Medicaid Other

## 2020-11-17 ENCOUNTER — Ambulatory Visit: Payer: Medicaid Other | Admitting: *Deleted

## 2020-11-17 ENCOUNTER — Other Ambulatory Visit: Payer: Self-pay

## 2020-11-17 ENCOUNTER — Encounter: Payer: Self-pay | Admitting: *Deleted

## 2020-11-17 ENCOUNTER — Ambulatory Visit (HOSPITAL_BASED_OUTPATIENT_CLINIC_OR_DEPARTMENT_OTHER): Payer: Medicaid Other

## 2020-11-17 DIAGNOSIS — Z3A32 32 weeks gestation of pregnancy: Secondary | ICD-10-CM | POA: Diagnosis not present

## 2020-11-17 DIAGNOSIS — O09899 Supervision of other high risk pregnancies, unspecified trimester: Secondary | ICD-10-CM | POA: Insufficient documentation

## 2020-11-17 DIAGNOSIS — Z8759 Personal history of other complications of pregnancy, childbirth and the puerperium: Secondary | ICD-10-CM

## 2020-11-17 DIAGNOSIS — D649 Anemia, unspecified: Secondary | ICD-10-CM

## 2020-11-17 DIAGNOSIS — B009 Herpesviral infection, unspecified: Secondary | ICD-10-CM | POA: Insufficient documentation

## 2020-11-17 DIAGNOSIS — O365930 Maternal care for other known or suspected poor fetal growth, third trimester, not applicable or unspecified: Secondary | ICD-10-CM | POA: Diagnosis not present

## 2020-11-17 DIAGNOSIS — O0990 Supervision of high risk pregnancy, unspecified, unspecified trimester: Secondary | ICD-10-CM | POA: Insufficient documentation

## 2020-11-17 DIAGNOSIS — O99013 Anemia complicating pregnancy, third trimester: Secondary | ICD-10-CM | POA: Diagnosis not present

## 2020-11-17 DIAGNOSIS — O359XX0 Maternal care for (suspected) fetal abnormality and damage, unspecified, not applicable or unspecified: Secondary | ICD-10-CM

## 2020-11-17 DIAGNOSIS — O09293 Supervision of pregnancy with other poor reproductive or obstetric history, third trimester: Secondary | ICD-10-CM

## 2020-11-17 DIAGNOSIS — O365990 Maternal care for other known or suspected poor fetal growth, unspecified trimester, not applicable or unspecified: Secondary | ICD-10-CM | POA: Insufficient documentation

## 2020-11-17 DIAGNOSIS — O09893 Supervision of other high risk pregnancies, third trimester: Secondary | ICD-10-CM

## 2020-11-17 DIAGNOSIS — O09299 Supervision of pregnancy with other poor reproductive or obstetric history, unspecified trimester: Secondary | ICD-10-CM | POA: Insufficient documentation

## 2020-11-18 ENCOUNTER — Inpatient Hospital Stay (HOSPITAL_COMMUNITY)
Admission: AD | Admit: 2020-11-18 | Discharge: 2020-11-22 | DRG: 806 | Disposition: A | Discharge status: Home / Self Care | Payer: Medicaid Other | Attending: Obstetrics and Gynecology | Admitting: Obstetrics and Gynecology

## 2020-11-18 ENCOUNTER — Encounter (HOSPITAL_COMMUNITY): Payer: Self-pay | Admitting: Family Medicine

## 2020-11-18 ENCOUNTER — Other Ambulatory Visit: Payer: Self-pay

## 2020-11-18 DIAGNOSIS — J45909 Unspecified asthma, uncomplicated: Secondary | ICD-10-CM | POA: Diagnosis not present

## 2020-11-18 DIAGNOSIS — O42913 Preterm premature rupture of membranes, unspecified as to length of time between rupture and onset of labor, third trimester: Secondary | ICD-10-CM | POA: Diagnosis not present

## 2020-11-18 DIAGNOSIS — O09893 Supervision of other high risk pregnancies, third trimester: Secondary | ICD-10-CM | POA: Diagnosis not present

## 2020-11-18 DIAGNOSIS — B009 Herpesviral infection, unspecified: Secondary | ICD-10-CM | POA: Diagnosis present

## 2020-11-18 DIAGNOSIS — O9081 Anemia of the puerperium: Secondary | ICD-10-CM | POA: Diagnosis not present

## 2020-11-18 DIAGNOSIS — O4290 Premature rupture of membranes, unspecified as to length of time between rupture and onset of labor, unspecified weeks of gestation: Secondary | ICD-10-CM | POA: Diagnosis not present

## 2020-11-18 DIAGNOSIS — O0990 Supervision of high risk pregnancy, unspecified, unspecified trimester: Secondary | ICD-10-CM

## 2020-11-18 DIAGNOSIS — Z3A32 32 weeks gestation of pregnancy: Secondary | ICD-10-CM

## 2020-11-18 DIAGNOSIS — O365930 Maternal care for other known or suspected poor fetal growth, third trimester, not applicable or unspecified: Secondary | ICD-10-CM | POA: Diagnosis not present

## 2020-11-18 DIAGNOSIS — O358XX0 Maternal care for other (suspected) fetal abnormality and damage, not applicable or unspecified: Secondary | ICD-10-CM | POA: Diagnosis not present

## 2020-11-18 DIAGNOSIS — Z8759 Personal history of other complications of pregnancy, childbirth and the puerperium: Secondary | ICD-10-CM

## 2020-11-18 DIAGNOSIS — D62 Acute posthemorrhagic anemia: Secondary | ICD-10-CM | POA: Diagnosis not present

## 2020-11-18 DIAGNOSIS — O26893 Other specified pregnancy related conditions, third trimester: Secondary | ICD-10-CM | POA: Diagnosis not present

## 2020-11-18 DIAGNOSIS — O9952 Diseases of the respiratory system complicating childbirth: Secondary | ICD-10-CM | POA: Diagnosis present

## 2020-11-18 DIAGNOSIS — Z20822 Contact with and (suspected) exposure to covid-19: Secondary | ICD-10-CM | POA: Diagnosis present

## 2020-11-18 DIAGNOSIS — O98513 Other viral diseases complicating pregnancy, third trimester: Secondary | ICD-10-CM | POA: Diagnosis not present

## 2020-11-18 DIAGNOSIS — O09299 Supervision of pregnancy with other poor reproductive or obstetric history, unspecified trimester: Secondary | ICD-10-CM

## 2020-11-18 DIAGNOSIS — O0933 Supervision of pregnancy with insufficient antenatal care, third trimester: Secondary | ICD-10-CM

## 2020-11-18 DIAGNOSIS — O42919 Preterm premature rupture of membranes, unspecified as to length of time between rupture and onset of labor, unspecified trimester: Secondary | ICD-10-CM | POA: Diagnosis not present

## 2020-11-18 DIAGNOSIS — O09899 Supervision of other high risk pregnancies, unspecified trimester: Secondary | ICD-10-CM

## 2020-11-18 DIAGNOSIS — O42013 Preterm premature rupture of membranes, onset of labor within 24 hours of rupture, third trimester: Secondary | ICD-10-CM | POA: Diagnosis not present

## 2020-11-18 LAB — TYPE AND SCREEN
ABO/RH(D): A POS
Antibody Screen: NEGATIVE

## 2020-11-18 LAB — GLUCOSE, CAPILLARY: Glucose-Capillary: 112 mg/dL — ABNORMAL HIGH (ref 70–99)

## 2020-11-18 LAB — CBC
HCT: 28.7 % — ABNORMAL LOW (ref 36.0–46.0)
Hemoglobin: 9.1 g/dL — ABNORMAL LOW (ref 12.0–15.0)
MCH: 29.5 pg (ref 26.0–34.0)
MCHC: 31.7 g/dL (ref 30.0–36.0)
MCV: 93.2 fL (ref 80.0–100.0)
Platelets: 276 10*3/uL (ref 150–400)
RBC: 3.08 MIL/uL — ABNORMAL LOW (ref 3.87–5.11)
RDW: 13.6 % (ref 11.5–15.5)
WBC: 9.4 10*3/uL (ref 4.0–10.5)
nRBC: 0 % (ref 0.0–0.2)

## 2020-11-18 LAB — GLUCOSE TOLERANCE, 1 HOUR: Glucose, 1 Hour GTT: 81 mg/dL (ref 70–140)

## 2020-11-18 LAB — RESP PANEL BY RT-PCR (FLU A&B, COVID) ARPGX2
Influenza A by PCR: NEGATIVE
Influenza B by PCR: NEGATIVE
SARS Coronavirus 2 by RT PCR: NEGATIVE

## 2020-11-18 LAB — RPR: RPR Ser Ql: NONREACTIVE

## 2020-11-18 MED ORDER — PRENATAL MULTIVITAMIN CH
1.0000 | ORAL_TABLET | Freq: Every day | ORAL | Status: DC
  Administered 2020-11-18 – 2020-11-20 (×3): 1 via ORAL
  Filled 2020-11-18 (×3): qty 1

## 2020-11-18 MED ORDER — SODIUM CHLORIDE 0.9 % IV SOLN
2.0000 g | Freq: Four times a day (QID) | INTRAVENOUS | Status: AC
  Administered 2020-11-18 – 2020-11-20 (×8): 2 g via INTRAVENOUS
  Filled 2020-11-18 (×8): qty 2000

## 2020-11-18 MED ORDER — ASPIRIN EC 81 MG PO TBEC
81.0000 mg | DELAYED_RELEASE_TABLET | Freq: Every day | ORAL | Status: DC
  Administered 2020-11-19 – 2020-11-20 (×2): 81 mg via ORAL
  Filled 2020-11-18 (×2): qty 1

## 2020-11-18 MED ORDER — LACTATED RINGERS IV SOLN
500.0000 mL | INTRAVENOUS | Status: DC | PRN
  Administered 2020-11-20: 1000 mL via INTRAVENOUS

## 2020-11-18 MED ORDER — SOD CITRATE-CITRIC ACID 500-334 MG/5ML PO SOLN: 30.0000 mL | ORAL | Status: DC | PRN

## 2020-11-18 MED ORDER — CALCIUM CARBONATE ANTACID 500 MG PO CHEW: 2.0000 | CHEWABLE_TABLET | ORAL | Status: DC | PRN

## 2020-11-18 MED ORDER — LIDOCAINE HCL (PF) 1 % IJ SOLN: 30.0000 mL | INTRAMUSCULAR | Status: DC | PRN

## 2020-11-18 MED ORDER — NIFEDIPINE ER OSMOTIC RELEASE 30 MG PO TB24
30.0000 mg | ORAL_TABLET | Freq: Once | ORAL | Status: AC
  Administered 2020-11-18: 30 mg via ORAL
  Filled 2020-11-18: qty 1

## 2020-11-18 MED ORDER — ZOLPIDEM TARTRATE 5 MG PO TABS: 5.0000 mg | ORAL_TABLET | Freq: Every evening | ORAL | Status: DC | PRN

## 2020-11-18 MED ORDER — VALACYCLOVIR HCL 500 MG PO TABS
500.0000 mg | ORAL_TABLET | Freq: Two times a day (BID) | ORAL | Status: DC
  Administered 2020-11-18 – 2020-11-20 (×5): 500 mg via ORAL
  Filled 2020-11-18 (×5): qty 1

## 2020-11-18 MED ORDER — DOCUSATE SODIUM 100 MG PO CAPS
100.0000 mg | ORAL_CAPSULE | Freq: Every day | ORAL | Status: DC
  Administered 2020-11-18 – 2020-11-20 (×3): 100 mg via ORAL
  Filled 2020-11-18 (×3): qty 1

## 2020-11-18 MED ORDER — AZITHROMYCIN 250 MG PO TABS
1000.0000 mg | ORAL_TABLET | Freq: Once | ORAL | Status: AC
  Administered 2020-11-18: 1000 mg via ORAL
  Filled 2020-11-18: qty 4

## 2020-11-18 MED ORDER — LACTATED RINGERS IV BOLUS
1000.0000 mL | Freq: Once | INTRAVENOUS | Status: AC
  Administered 2020-11-18: 1000 mL via INTRAVENOUS

## 2020-11-18 MED ORDER — LACTATED RINGERS IV SOLN: INTRAVENOUS | Status: DC

## 2020-11-18 MED ORDER — OXYTOCIN BOLUS FROM INFUSION: 333.0000 mL | Freq: Once | INTRAVENOUS | Status: DC

## 2020-11-18 MED ORDER — ONDANSETRON HCL 4 MG/2ML IJ SOLN
4.0000 mg | Freq: Four times a day (QID) | INTRAMUSCULAR | Status: DC | PRN
  Administered 2020-11-18 – 2020-11-20 (×2): 4 mg via INTRAVENOUS
  Filled 2020-11-18 (×2): qty 2

## 2020-11-18 MED ORDER — ALBUTEROL SULFATE (2.5 MG/3ML) 0.083% IN NEBU: 3.0000 mL | INHALATION_SOLUTION | Freq: Four times a day (QID) | RESPIRATORY_TRACT | Status: DC | PRN

## 2020-11-18 MED ORDER — AMOXICILLIN 500 MG PO CAPS
500.0000 mg | ORAL_CAPSULE | Freq: Three times a day (TID) | ORAL | Status: DC
  Administered 2020-11-20 (×3): 500 mg via ORAL
  Filled 2020-11-18 (×3): qty 1

## 2020-11-18 MED ORDER — OXYTOCIN-SODIUM CHLORIDE 30-0.9 UT/500ML-% IV SOLN: 2.5000 [IU]/h | INTRAVENOUS | Status: DC

## 2020-11-18 MED ORDER — ACETAMINOPHEN 325 MG PO TABS: 650.0000 mg | ORAL_TABLET | ORAL | Status: DC | PRN

## 2020-11-18 MED ORDER — BETAMETHASONE SOD PHOS & ACET 6 (3-3) MG/ML IJ SUSP
12.0000 mg | INTRAMUSCULAR | Status: AC
  Administered 2020-11-18 – 2020-11-19 (×2): 12 mg via INTRAMUSCULAR
  Filled 2020-11-18 (×2): qty 5

## 2020-11-18 MED ORDER — OXYCODONE-ACETAMINOPHEN 5-325 MG PO TABS: 2.0000 | ORAL_TABLET | ORAL | Status: DC | PRN

## 2020-11-18 MED ORDER — FLEET ENEMA 7-19 GM/118ML RE ENEM: 1.0000 | ENEMA | RECTAL | Status: DC | PRN

## 2020-11-18 MED ORDER — OXYCODONE-ACETAMINOPHEN 5-325 MG PO TABS: 1.0000 | ORAL_TABLET | ORAL | Status: DC | PRN

## 2020-11-18 MED ORDER — VALACYCLOVIR HCL 500 MG PO TABS
500.0000 mg | ORAL_TABLET | Freq: Every day | ORAL | Status: DC
  Administered 2020-11-18: 500 mg via ORAL
  Filled 2020-11-18: qty 1

## 2020-11-18 NOTE — MAU Provider Note (Signed)
Chief Complaint:  Rupture of Membranes   Event Date/Time   First Provider Initiated Contact with Patient 11/18/20 0703     HPI: Cindy [name] is a 25 y.o. N2T5573 at 39w1dwho presents to maternity admissions reporting having a gush of fluid at 3am. Having some mild contractions.  Has missed the last several prenatal visits. Has not had glucola.  She reports good fetal movement, denies vaginal bleeding, vaginal itching/burning, urinary symptoms, h/a, dizziness, n/v, diarrhea, constipation or fever/chills.  Last OB visit was 08/30/20.    Vaginal Discharge The patient's primary symptoms include pelvic pain (very mild) and vaginal discharge. The patient's pertinent negatives include no genital itching, genital lesions, genital odor or vaginal bleeding. This is a new problem. The current episode started today. The problem occurs intermittently. The problem has been unchanged. The pain is mild. She is pregnant. Pertinent negatives include no chills, constipation, diarrhea, fever, nausea or vomiting. The vaginal discharge was clear and watery. There has been no bleeding. She has not been passing clots. She has not been passing tissue. Nothing aggravates the symptoms. She has tried nothing for the symptoms.   RN Note: Pt reports she has been leaking clear fluid since 3am. Reports some occasional mild ctx but not very painful. Good fetal movement reported.  Past Medical History: Past Medical History:  Diagnosis Date  . Asthma   . Heart murmur   . Infection    UTI  . Kidney stone   . Kidney stones   . Preeclampsia     Past obstetric history: OB History  Gravida Para Term Preterm AB Living  5 2 1 1 2 2   SAB IAB Ectopic Multiple Live Births  2 0 0 0 2    # Outcome Date GA Lbr Len/2nd Weight Sex Delivery Anes PTL Lv  5 Current           4 Preterm 09/10/19 [redacted]w[redacted]d 01:32 / 00:05 1960 g F Vag-Spont EPI  LIV     Birth Comments: severe preeclampsia, IUGR  3 SAB 2020          2 Term 05/31/16  [redacted]w[redacted]d 16:30 / 06:05 3070 g F Vag-Spont EPI  LIV  1 SAB 2016            Past Surgical History: Past Surgical History:  Procedure Laterality Date  . NO PAST SURGERIES      Family History: Family History  Problem Relation Age of Onset  . Healthy Mother   . Healthy Father     Social History: Social History   Tobacco Use  . Smoking status: Never Smoker  . Smokeless tobacco: Never Used  Vaping Use  . Vaping Use: Never used  Substance Use Topics  . Alcohol use: No  . Drug use: Not Currently    Types: Marijuana    Comment: February 2021    Allergies:  Allergies  Allergen Reactions  . Acetaminophen Anaphylaxis and Swelling    Throat swells, couldn't breath.    Meds:  Medications Prior to Admission  Medication Sig Dispense Refill Last Dose  . albuterol (VENTOLIN HFA) 108 (90 Base) MCG/ACT inhaler Inhale 1 puff into the lungs every 6 (six) hours as needed for wheezing or shortness of breath.   Past Week at Unknown time  . amoxicillin (AMOXIL) 500 MG tablet Take 500 mg by mouth 2 (two) times daily.     March 2021 aspirin EC 81 MG tablet Take 1 tablet (81 mg total) by mouth daily. Swallow whole. 30 tablet 11  11/18/2020 at Unknown time  . Prenatal Vit-Fe Fumarate-FA (PRENATAL MULTIVITAMIN) TABS tablet Take 1 tablet by mouth daily at 12 noon.   11/18/2020 at Unknown time  . promethazine (PHENERGAN) 25 MG tablet Take 1 tablet (25 mg total) by mouth every 6 (six) hours as needed for nausea or vomiting. 30 tablet 2 More than a month at Unknown time   I have reviewed patient's Past Medical Hx, Surgical Hx, Family Hx, Social Hx, medications and allergies.   ROS:  Review of Systems  Constitutional: Negative for chills and fever.  Gastrointestinal: Negative for constipation, diarrhea, nausea and vomiting.  Genitourinary: Positive for pelvic pain (very mild) and vaginal discharge.   Other systems negative  Physical Exam   Patient Vitals for the past 24 hrs:  BP Temp Pulse Resp Height  Weight  11/18/20 0630 115/64 98.2 F (36.8 C) 84 18 5\' 5"  (1.651 m) 57.6 kg   Constitutional: Well-developed, well-nourished female in no acute distress.  Cardiovascular: normal rate and rhythm Respiratory: normal effort GI: Abd soft, non-tender, gravid appropriate for gestational age.   No rebound or guarding. MS: Extremities nontender, no edema, normal ROM Neurologic: Alert and oriented x 4.  GU: Neg CVAT.  PELVIC EXAM: Grossly ruptured with clear fluid leaking, Positive Ferning  FHT:  Baseline 140 , moderate variability, accelerations present, no decelerations Contractions: q 2-5 mins Irregular    Labs:  A/Positive/-- (01/12 1002)  Imaging:    MAU Course/MDM: Dr 12-01-1999 is placing admission orders. NST reviewed, reassuring. Consult Dr Shawnie Pons with presentation, exam findings and test results.  Treatments in MAU included EFM.    Assessment: Single IUP at [redacted]w[redacted]d Preterm Premature Rupture of Membranes Irregular preterm contractions  Plan: Admit to Labor and Delivery Will get 1 hour Glucola before giving steroids Betamethasone series Antenatal PPROM antibiotics Labor team to follow  [redacted]w[redacted]d CNM, MSN Certified Nurse-Midwife 11/18/2020 7:03 AM

## 2020-11-18 NOTE — MAU Note (Signed)
Pt reports she has been leaking clear fluid since 3am. Reports some occasional mild ctx but not very painful. Good fetal movement reported.

## 2020-11-18 NOTE — Progress Notes (Signed)
Labor Progress Note Cindy [name] is a 25 y.o. [id number] at [redacted]w[redacted]d presented for PPROM  S: sitting in bed, talking on phone. Reports not feeling any contractions  O:  BP 118/61   Pulse 97   Temp 98 F (36.7 C) (Axillary)   Resp 18   Ht 5\' 5"  (1.651 m)   Wt 57.6 kg   LMP 04/07/2020 (Exact Date)   BMI 21.13 kg/m  EFM: 145//mod/+accels, no decels  CVE:  DEFERRED   A&P: 25 y.o. 30 [redacted]w[redacted]d admitted with PPROM with no labor progression. Safe to transfer to [facility] speciality.   #Labor: Not progressing  #PPROM: received azithro and ampicillin. Will continue latency abx. Discussed with patient admission to hospital until delivery or IOL at 34 weeks. She voiced understanding about prolonged admission.   #Routine prenatal care: 1 h GTT was WNL  #Pain: none currently #FWB: Cat 1. Received BMZ x1, next dose in 24 hrs.   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  [facility], MD 2:11 PM

## 2020-11-18 NOTE — Consult Note (Signed)
[facility] 11/18/2020    11:59 PM  Neonatal Medicine Consultation         Cindy [name]          MRN:  [id number]  I was called at the request of the patient's obstetrician (Dr. Shawnie Pons) to speak to this patient due to expected premature delivery as early as 32 weeks for PROM.  The patient's prenatal course includes IUGR (9% EFW as of 11/17/20).  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  Prenatal ultrasounds have revealed bilateral shortened femurs, earlier with some bowing.  Has had MFM involvement--the mother declined an amniocentesis.  Today she had PROM (32 1/7 weeks).  She is admitted to [facility] after being monitored and found to not be in labor.  She is receiving treatment that includes betamethasone and latency antibiotics.  The baby is a boy.  I reviewed expectations for a baby born at 32+ weeks, including survival, length of stay, issues such as respiratory distress, feeding delays (described how we provide respiratory and feeding support).  Mom plans to breast feed, which I encouraged as best for the baby (with supplementations for needed calories).  Plan is for her to deliver at 34 weeks unless she develops labor sooner.    I spent 20 minutes reviewing the record, speaking to the patient, and entering appropriate documentation.  More than 50% of the time was spent face to face with patient.   _____________________ Angelita Ingles, MD Attending Neonatologist

## 2020-11-18 NOTE — H&P (Signed)
Chief Complaint:  Rupture of Membranes   Event Date/Time   First Provider Initiated Contact with Patient 11/18/20 0703     HPI: Cindy [name] is a 25 y.o. [id number] at 39w1dwho presents to maternity admissions reporting having a gush of fluid at 3am. Having some mild contractions.  Has missed the last several prenatal visits. Has not had glucola.  She reports good fetal movement, denies vaginal bleeding, vaginal itching/burning, urinary symptoms, h/a, dizziness, n/v, diarrhea, constipation or fever/chills.  Last OB visit was 08/30/20.    Vaginal Discharge The patient's primary symptoms include pelvic pain (very mild) and vaginal discharge. The patient's pertinent negatives include no genital itching, genital lesions, genital odor or vaginal bleeding. This is a new problem. The current episode started today. The problem occurs intermittently. The problem has been unchanged. The pain is mild. She is pregnant. Pertinent negatives include no chills, constipation, diarrhea, fever, nausea or vomiting. The vaginal discharge was clear and watery. There has been no bleeding. She has not been passing clots. She has not been passing tissue. Nothing aggravates the symptoms. She has tried nothing for the symptoms.   RN Note: Pt reports she has been leaking clear fluid since 3am. Reports some occasional mild ctx but not very painful. Good fetal movement reported.  Past Medical History: Past Medical History:  Diagnosis Date  . Asthma   . Heart murmur   . Infection    UTI  . Kidney stone   . Kidney stones   . Preeclampsia     Past obstetric history: OB History  Gravida Para Term Preterm AB Living  5 2 1 1 2 2   SAB IAB Ectopic Multiple Live Births  2 0 0 0 2    # Outcome Date GA Lbr Len/2nd Weight Sex Delivery Anes PTL Lv  5 Current           4 Preterm 09/10/19 [redacted]w[redacted]d 01:32 / 00:05 1960 g F Vag-Spont EPI  LIV     Birth Comments: severe preeclampsia, IUGR  3 SAB 2020          2 Term 05/31/16  [redacted]w[redacted]d 16:30 / 06:05 3070 g F Vag-Spont EPI  LIV  1 SAB 2016            Past Surgical History: Past Surgical History:  Procedure Laterality Date  . NO PAST SURGERIES      Family History: Family History  Problem Relation Age of Onset  . Healthy Mother   . Healthy Father     Social History: Social History   Tobacco Use  . Smoking status: Never Smoker  . Smokeless tobacco: Never Used  Vaping Use  . Vaping Use: Never used  Substance Use Topics  . Alcohol use: No  . Drug use: Not Currently    Types: Marijuana    Comment: February 2021    Allergies:  Allergies  Allergen Reactions  . Acetaminophen Anaphylaxis and Swelling    Throat swells, couldn't breath.    Meds:  Medications Prior to Admission  Medication Sig Dispense Refill Last Dose  . albuterol (VENTOLIN HFA) 108 (90 Base) MCG/ACT inhaler Inhale 1 puff into the lungs every 6 (six) hours as needed for wheezing or shortness of breath.   Past Week at Unknown time  . amoxicillin (AMOXIL) 500 MG tablet Take 500 mg by mouth 2 (two) times daily.     March 2021 aspirin EC 81 MG tablet Take 1 tablet (81 mg total) by mouth daily. Swallow whole. 30 tablet 11  11/18/2020 at Unknown time  . Prenatal Vit-Fe Fumarate-FA (PRENATAL MULTIVITAMIN) TABS tablet Take 1 tablet by mouth daily at 12 noon.   11/18/2020 at Unknown time  . promethazine (PHENERGAN) 25 MG tablet Take 1 tablet (25 mg total) by mouth every 6 (six) hours as needed for nausea or vomiting. 30 tablet 2 More than a month at Unknown time   I have reviewed patient's Past Medical Hx, Surgical Hx, Family Hx, Social Hx, medications and allergies.   ROS:  Review of Systems  Constitutional: Negative for chills and fever.  Gastrointestinal: Negative for constipation, diarrhea, nausea and vomiting.  Genitourinary: Positive for pelvic pain (very mild) and vaginal discharge.   Other systems negative  Physical Exam   Patient Vitals for the past 24 hrs:  BP Temp Pulse Resp Height  Weight  11/18/20 0630 115/64 98.2 F (36.8 C) 84 18 5' 5" (1.651 m) 57.6 kg   Constitutional: Well-developed, well-nourished female in no acute distress.  Cardiovascular: normal rate and rhythm Respiratory: normal effort GI: Abd soft, non-tender, gravid appropriate for gestational age.   No rebound or guarding. MS: Extremities nontender, no edema, normal ROM Neurologic: Alert and oriented x 4.  GU: Neg CVAT.  PELVIC EXAM: Grossly ruptured with clear fluid leaking, Positive Ferning  FHT:  Baseline 140 , moderate variability, accelerations present, no decelerations Contractions: q 2-5 mins Irregular    Labs:  A/Positive/-- (01/12 1002)  Imaging:    MAU Course/MDM: Dr Pratt is placing admission orders. NST reviewed, reassuring. Consult Dr Pratt with presentation, exam findings and test results.  Treatments in MAU included EFM.    Assessment: Single IUP at [redacted]w[redacted]d Preterm Premature Rupture of Membranes Irregular preterm contractions  Plan: Admit to Labor and Delivery Will get 1 hour Glucola before giving steroids Betamethasone series Antenatal PPROM antibiotics Labor team to follow  Tomara Youngberg CNM, MSN Certified Nurse-Midwife 11/18/2020 7:03 AM 

## 2020-11-19 DIAGNOSIS — Z3A32 32 weeks gestation of pregnancy: Secondary | ICD-10-CM

## 2020-11-19 DIAGNOSIS — O365930 Maternal care for other known or suspected poor fetal growth, third trimester, not applicable or unspecified: Secondary | ICD-10-CM | POA: Diagnosis not present

## 2020-11-19 DIAGNOSIS — O42913 Preterm premature rupture of membranes, unspecified as to length of time between rupture and onset of labor, third trimester: Secondary | ICD-10-CM | POA: Diagnosis not present

## 2020-11-19 NOTE — Lactation Note (Signed)
Lactation Consultation Note  Patient Name: Cindy [name] Date: 11/19/2020   Age:25 y.o.   [facility] met with Mother following her request for [facility] visit to talk about breastfeeding. Mom has two previous children at breastfeed each for about 2-3 months. Her first child had a tongue tie and she declined correction. Her second child had a stay in the NICU, Mom stated she used a pacifier as a result after 3 months would no longer latch.  Today, we went over feeding cues, different positions and how to get a deep latch. We talked about flanging out the lips and ensuring the cheeks and nose are touching the breasts.  Mom was receptive and we practiced different positions and how to do hand expression to get colostrum.   All questions answered at the end of the visit.   Maternal Data    Feeding    LATCH Score                    Lactation Tools Discussed/Used    Interventions    Discharge    Consult Status      Cindy Wigger  [name] 11/19/2020, 12:55 PM

## 2020-11-19 NOTE — Progress Notes (Signed)
Patient ID: Cindy [name], female   DOB: 14-Nov-1995, 25 y.o.   MRN: [id number] FACULTY PRACTICE ANTEPARTUM(COMPREHENSIVE) NOTE  Cindy [name] is a 25 y.o. [id number] at [redacted]w[redacted]d  who is admitted for PROM.    Fetal presentation is cephalic. Length of Stay:  1  Days  Date of admission:11/18/2020  Subjective: Patient is without complaints. She reports occasional mild contractions Patient reports the fetal movement as active. Patient reports uterine contraction  activity as none. Patient reports  vaginal bleeding as none. Patient describes fluid per vagina as Clear.  Vitals:  Blood pressure 108/60, pulse 79, temperature 98.2 F (36.8 C), temperature source Oral, resp. rate 16, height  (1.651 m), weight 57.6 kg, last menstrual period 04/07/2020, SpO2 98 %, unknown if currently breastfeeding. Vitals:   11/18/20 1623 11/18/20 1928 11/18/20 2255 11/19/20 0349  BP: (!) 120/57 (!) 109/53 109/60 108/60  Pulse: 90 74 78 79  Resp: Temp: 99.2 F (37.3 C) 98.7 F (37.1 C) 98.8 F (37.1 C) 98.2 F (36.8 C)  TempSrc: Oral Oral Oral Oral  SpO2: 97% 98% 97% 98%  Weight:      Height:       Physical Examination: GENERAL: Well-developed, well-nourished female in no acute distress.  LUNGS: Clear to auscultation bilaterally.  HEART: Regular rate and rhythm. ABDOMEN: Soft, nontender, gravid PELVIC: Not performed. EXTREMITIES: No cyanosis, clubbing, or edema, 2+ distal pulses.  Fetal Monitoring:  Baseline: 140 bpm, Variability: Good {> 6 bpm), Accelerations: Reactive and Decelerations: 1 episode of a prolonged decel and a variable overnight   reactive  Labs:  Results for orders placed or performed during the hospital encounter of 11/18/20 (from the past 24 hour(s))  Resp Panel by RT-PCR (Flu A&B, Covid) Nasopharyngeal Swab   Collection Time: 11/18/20  7:17 AM   Specimen: Nasopharyngeal Swab; Nasopharyngeal(NP) swabs in vial transport medium  Result Value Ref Range   SARS  Coronavirus 2 by RT PCR NEGATIVE NEGATIVE   Influenza A by PCR NEGATIVE NEGATIVE   Influenza B by PCR NEGATIVE NEGATIVE  CBC   Collection Time: 11/18/20  7:36 AM  Result Value Ref Range   WBC 9.4 4.0 - 10.5 K/uL   RBC 3.08 (L) 3.87 - 5.11 MIL/uL   Hemoglobin 9.1 (L) 12.0 - 15.0 g/dL   HCT 14.7 (L) 82.9 - 56.2 %   MCV 93.2 80.0 - 100.0 fL   MCH 29.5 26.0 - 34.0 pg   MCHC 31.7 30.0 - 36.0 g/dL   RDW 13.0 86.5 - 78.4 %   Platelets 276 150 - 400 K/uL   nRBC 0.0 0.0 - 0.2 %  RPR   Collection Time: 11/18/20  7:36 AM  Result Value Ref Range   RPR Ser Ql NON REACTIVE NON REACTIVE  Glucose tolerance, 1 hour   Collection Time: 11/18/20  7:36 AM  Result Value Ref Range   Glucose, 1 Hour GTT 81 70 - 140 mg/dL  Type and screen [facility]   Collection Time: 11/18/20  7:36 AM  Result Value Ref Range   ABO/RH(D) A POS    Antibody Screen NEG    Sample Expiration      11/21/2020,2359 Performed at [name], [address] [place], [place] [address]   Glucose, capillary   Collection Time: 11/18/20  9:38 AM  Result Value Ref Range   Glucose-Capillary 112 (H) 70 - 99 mg/dL    Imaging Studies:    [place] MFM FETAL  BPP WO NON STRESS  Result Date: 11/17/2020 ----------------------------------------------------------------------  OBSTETRICS REPORT                       (Signed Final 11/17/2020 10:17 am) ---------------------------------------------------------------------- Patient Info  ID #:       [id number]                          D.O.B.:  Mar 05, 1996 (24 yrs)  Name:       Cindy [name] [facility]              Visit Date: 11/17/2020 08:50 am ---------------------------------------------------------------------- Performed By  Attending:        Noralee Space MD        Ref. Address:     [address]                                                             [place], [place]                                                             [address]  Performed By:     Tommie Raymond BS,        [facility]                    RDMS, RVT                                [facility]  Referred By:      [facility] ---------------------------------------------------------------------- Orders  #  Description                           Code        Ordered By  1  [place] MFM FETAL BPP WO NON               24401.02    YU FANG     STRESS  2  [place] MFM UA CORD DOPPLER  24580.99    YU FANG  3  [place] MFM OB FOLLOW UP                   [id number]    YU FANG ----------------------------------------------------------------------  #  Order #                     Accession #                Episode #  1  [id number]                   [id number]                 [id number]  2  [id number]                   [id number]                 [id number]  3  [id number]                   [id number]                 [id number] ---------------------------------------------------------------------- Indications  [redacted] weeks gestation of pregnancy                Z3A.32  Maternal care for known or suspected poor      O36.5931  fetal growth, third trimester, fetus 1 IUGR  Poor obstetric history: Previous fetal growth  O09.299  restriction (FGR)  Poor obstetric history: Previous               O09.299  preeclampsia / eclampsia/gestational HTN  Anemia during pregnancy in third trimester     O99.013  Short interval between pregancies, 3rd         O09.893  trimester  Fetal abnormality - other known or             O35.9XX0  suspected (Bowed femurs)  LOW risk NIPS, Negative AFP  Encounter for other antenatal screening        Z36.2  follow-up ---------------------------------------------------------------------- Fetal Evaluation  Num Of Fetuses:         1  Fetal Heart Rate(bpm):  144  Cardiac Activity:       Observed  Presentation:           Cephalic  Placenta:                Anterior  P. Cord Insertion:      Previously Visualized  Amniotic Fluid  AFI FV:      Within normal limits  AFI Sum(cm)     %Tile       Largest Pocket(cm)  15.6            56          5.1  RUQ(cm)       RLQ(cm)       LUQ(cm)        LLQ(cm)  5.1           3.9           3.3            3.3 ---------------------------------------------------------------------- Biophysical Evaluation  Amniotic F.V:   Pocket => 2 cm             F. Tone:        Observed  F. Movement:    Observed  Score:          8/8  F. Breathing:   Observed ---------------------------------------------------------------------- Biometry  BPD:      83.1  mm     G. Age:  33w 3d         81  %    CI:        80.92   %    70 - 86                                                          FL/HC:      17.8   %    19.1 - 21.3  HC:      291.7  mm     G. Age:  32w 1d         18  %    HC/AC:      1.05        0.96 - 1.17  AC:      277.3  mm     G. Age:  31w 6d         42  %    FL/BPD:     62.3   %    71 - 87  FL:       51.8  mm     G. Age:  27w 5d        < 1  %    FL/AC:      18.7   %    20 - 24  HUM:      50.9  mm     G. Age:  29w 6d        < 5  %  CER:      41.9  mm     G. Age:  33w 2d         62  %  LV:        4.1  mm  CM:        6.1  mm   RIGHT  HUM:      51.1  mm     G. Age:  30w 0d          6  %  ULN:        49  mm     G. Age:  31w 1d         23  %  TIB:      48.6  mm     G. Age:  29w 2d        < 5  %  RAD:      41.6  mm     G. Age:  28w 6d         31  %  FIB:        48  mm     G. Age:  29w 4d         41  %   LEFT  HUM:      52.1  mm     G. Age:  30w 3d         17  %  ULN:      42.5  mm     G. Age:  27w 3d        < 5  %  TIB:  49.9  mm     G. Age:  29w 6d         13  %  RAD:      42.1  mm     G. Age:  29w 1d         34  %  FIB:      49.3  mm     G. Age:  30w 1d         47  %  Est. FW:    1620  gm      3 lb 9 oz      9  % ---------------------------------------------------------------------- OB History  Gravidity:    5  Living:       2  ---------------------------------------------------------------------- Gestational Age  LMP:           32w 0d        Date:  04/07/20                 EDD:   01/12/21  U/S Today:     31w 2d                                        EDD:   01/17/21  Best:          32w 0d     Det. By:  LMP  (04/07/20)          EDD:   01/12/21 ---------------------------------------------------------------------- Anatomy  Cranium:               Appears normal         Aortic Arch:            Previously seen  Cavum:                 Appears normal         Ductal Arch:            Previously seen  Ventricles:            Appears normal         Diaphragm:              Appears normal  Choroid Plexus:        Previously seen        Stomach:                Appears normal, left                                                                        sided  Cerebellum:            Appears normal         Abdomen:                Previously seen  Posterior Fossa:       Appears normal         Abdominal Wall:         Previously seen  Nuchal Fold:           Previously seen        Cord Vessels:  Previously seen  Face:                  Orbits and profile     Kidneys:                Previously seen                         previously seen  Lips:                  Previously seen        Bladder:                Appears normal  Thoracic:              Previously seen        Spine:                  Previously seen  Heart:                 Appears normal         Upper Extremities:      Visualized                         (4CH, axis, and                         situs)  RVOT:                  Previously seen        Lower Extremities:      Visualized, see                                                                        comments  LVOT:                  Previously seen  Other:  Female gender previously seen. 3VV and 3VTV visualized previously.          adv ---------------------------------------------------------------------- Doppler - Fetal Vessels  Umbilical Artery    S/D     %tile      RI    %tile                             ADFV    RDFV   3.11       75    0.68       78                                No      No ---------------------------------------------------------------------- Cervix Uterus Adnexa  Cervix  Not visualized (advanced GA >24wks)  Uterus  No abnormality visualized.  Right Ovary  Within normal limits.  Left Ovary  Within normal limits.  Cul De Sac  No free fluid seen.  Adnexa  No adnexal mass visualized. ---------------------------------------------------------------------- Impression  Fetal growth restriction.  On ultrasound performed 4 weeks  ago, the estimated fetal weight was at the 5th percentile.  Blood pressure today at her office is  108/61 mmHg.  Obstetric history significant for a pregnancy complicated by  fetal growth restriction and preeclampsia.  On today's ultrasound, the estimated fetal weight is at the 9th  percentile.  Female length measurement is at the 1st  percentile.  All other long bone measurements appear normal  except tibia.  No evidence of demineralization or bowing.  Amniotic fluid is normal and good fetal activity seen.  Antenatal testing is reassuring.  BPP 8/8.  Umbilical artery  Doppler showed normal forward diastolic flow.  I reassured the patient of the findings and explained the  ultrasound protocol of monitoring fetal growth restriction.  She has not had prenatal visits for several weeks.  She has  an appointment on 11/23/2020 and I encouraged her to keep  that appointment.  We discussed screening for gestational  diabetes. ---------------------------------------------------------------------- Recommendations  -Continue weekly BPP and UA Doppler till delivery. ----------------------------------------------------------------------                  Noralee Space, MD Electronically Signed Final Report   11/17/2020 10:17 am ----------------------------------------------------------------------  [place] MFM OB FOLLOW UP  Result Date:  11/17/2020 ----------------------------------------------------------------------  OBSTETRICS REPORT                       (Signed Final 11/17/2020 10:17 am) ---------------------------------------------------------------------- Patient Info  ID #:       [id number]                          D.O.B.:  10-30-1995 (24 yrs)  Name:       KAELYN INNOCENT [facility]              Visit Date: 11/17/2020 08:50 am ---------------------------------------------------------------------- Performed By  Attending:        Noralee Space MD        Ref. Address:     [address]                                                             [place], [place]                                                             [address]  Performed By:     Tommie Raymond BS,       Location[facility]                    RDMS, RVT                                [facility]                                                             [facility]  Referred By:      [facility] ---------------------------------------------------------------------- Orders  #  Description                           Code        Ordered By  1  [place] MFM FETAL BPP WO NON               [id number]    YU FANG     STRESS  2  [place] MFM UA CORD DOPPLER                76820.02    YU FANG  3  [place] MFM OB FOLLOW UP                   [id number]    YU FANG ----------------------------------------------------------------------  #  Order #                     Accession #                Episode #  1  [id number]                   [id number]                 [id number]  2  [id number]                   [id number]                 [id number]  3  [id number]                   [id number]                 [id number] ---------------------------------------------------------------------- Indications  [redacted] weeks gestation of pregnancy                Z3A.32  Maternal care for known or suspected poor      O36.5931  fetal growth,  third trimester, fetus 1 IUGR  Poor obstetric history: Previous fetal growth  O09.299  restriction (FGR)  Poor obstetric history: Previous               O09.299  preeclampsia / eclampsia/gestational HTN  Anemia during pregnancy in third trimester     O99.013  Short interval between pregancies, 3rd         O09.893  trimester  Fetal abnormality - other known or             O35.9XX0  suspected (Bowed femurs)  LOW risk NIPS, Negative AFP  Encounter for other antenatal screening        Z36.2  follow-up ---------------------------------------------------------------------- Fetal Evaluation  Num Of Fetuses:         1  Fetal Heart Rate(bpm):  144  Cardiac Activity:       Observed  Presentation:           Cephalic  Placenta:               Anterior  P. Cord Insertion:      Previously Visualized  Amniotic Fluid  AFI FV:      Within normal limits  AFI Sum(cm)     %Tile       Largest Pocket(cm)  15.6  56          5.1  RUQ(cm)       RLQ(cm)       LUQ(cm)        LLQ(cm)  5.1           3.9           3.3            3.3 ---------------------------------------------------------------------- Biophysical Evaluation  Amniotic F.V:   Pocket => 2 cm             F. Tone:        Observed  F. Movement:    Observed                   Score:          8/8  F. Breathing:   Observed ---------------------------------------------------------------------- Biometry  BPD:      83.1  mm     G. Age:  33w 3d         81  %    CI:        80.92   %    70 - 86                                                          FL/HC:      17.8   %    19.1 - 21.3  HC:      291.7  mm     G. Age:  32w 1d         18  %    HC/AC:      1.05        0.96 - 1.17  AC:      277.3  mm     G. Age:  31w 6d         42  %    FL/BPD:     62.3   %    71 - 87  FL:       51.8  mm     G. Age:  27w 5d        < 1  %    FL/AC:      18.7   %    20 - 24  HUM:      50.9  mm     G. Age:  29w 6d        < 5  %  CER:      41.9  mm     G. Age:  33w 2d         62  %  LV:        4.1  mm  CM:         6.1  mm   RIGHT  HUM:      51.1  mm     G. Age:  30w 0d          6  %  ULN:        49  mm     G. Age:  31w 1d         23  %  TIB:      48.6  mm     G. Age:  29w 2d        < 5  %  RAD:      41.6  mm     G. Age:  28w 6d         31  %  FIB:        48  mm     G. Age:  29w 4d         41  %   LEFT  HUM:      52.1  mm     G. Age:  30w 3d         17  %  ULN:      42.5  mm     G. Age:  27w 3d        < 5  %  TIB:      49.9  mm     G. Age:  29w 6d         13  %  RAD:      42.1  mm     G. Age:  29w 1d         34  %  FIB:      49.3  mm     G. Age:  30w 1d         47  %  Est. FW:    1620  gm      3 lb 9 oz      9  % ---------------------------------------------------------------------- OB History  Gravidity:    5  Living:       2 ---------------------------------------------------------------------- Gestational Age  LMP:           32w 0d        Date:  04/07/20                 EDD:   01/12/21  U/S Today:     31w 2d                                        EDD:   01/17/21  Best:          32w 0d     Det. By:  LMP  (04/07/20)          EDD:   01/12/21 ---------------------------------------------------------------------- Anatomy  Cranium:               Appears normal         Aortic Arch:            Previously seen  Cavum:                 Appears normal         Ductal Arch:            Previously seen  Ventricles:            Appears normal         Diaphragm:              Appears normal  Choroid Plexus:        Previously seen        Stomach:                Appears normal, left  sided  Cerebellum:            Appears normal         Abdomen:                Previously seen  Posterior Fossa:       Appears normal         Abdominal Wall:         Previously seen  Nuchal Fold:           Previously seen        Cord Vessels:           Previously seen  Face:                  Orbits and profile     Kidneys:                Previously seen                         previously seen   Lips:                  Previously seen        Bladder:                Appears normal  Thoracic:              Previously seen        Spine:                  Previously seen  Heart:                 Appears normal         Upper Extremities:      Visualized                         (4CH, axis, and                         situs)  RVOT:                  Previously seen        Lower Extremities:      Visualized, see                                                                        comments  LVOT:                  Previously seen  Other:  Female gender previously seen. 3VV and 3VTV visualized previously.          adv ---------------------------------------------------------------------- Doppler - Fetal Vessels  Umbilical Artery   S/D     %tile      RI    %tile                             ADFV    RDFV   3.11       75    0.68       78  No      No ---------------------------------------------------------------------- Cervix Uterus Adnexa  Cervix  Not visualized (advanced GA >24wks)  Uterus  No abnormality visualized.  Right Ovary  Within normal limits.  Left Ovary  Within normal limits.  Cul De Sac  No free fluid seen.  Adnexa  No adnexal mass visualized. ---------------------------------------------------------------------- Impression  Fetal growth restriction.  On ultrasound performed 4 weeks  ago, the estimated fetal weight was at the 5th percentile.  Blood pressure today at her office is 108/61 mmHg.  Obstetric history significant for a pregnancy complicated by  fetal growth restriction and preeclampsia.  On today's ultrasound, the estimated fetal weight is at the 9th  percentile.  Female length measurement is at the 1st  percentile.  All other long bone measurements appear normal  except tibia.  No evidence of demineralization or bowing.  Amniotic fluid is normal and good fetal activity seen.  Antenatal testing is reassuring.  BPP 8/8.  Umbilical artery  Doppler showed normal forward  diastolic flow.  I reassured the patient of the findings and explained the  ultrasound protocol of monitoring fetal growth restriction.  She has not had prenatal visits for several weeks.  She has  an appointment on 11/23/2020 and I encouraged her to keep  that appointment.  We discussed screening for gestational  diabetes. ---------------------------------------------------------------------- Recommendations  -Continue weekly BPP and UA Doppler till delivery. ----------------------------------------------------------------------                  Noralee Space, MD Electronically Signed Final Report   11/17/2020 10:17 am ----------------------------------------------------------------------  [place] MFM UA CORD DOPPLER  Result Date: 11/17/2020 ----------------------------------------------------------------------  OBSTETRICS REPORT                       (Signed Final 11/17/2020 10:17 am) ---------------------------------------------------------------------- Patient Info  ID #:       [id number]                          D.O.B.:  1996/05/24 (24 yrs)  Name:       AUNDRAYA DRIPPS [facility]              Visit Date: 11/17/2020 08:50 am ---------------------------------------------------------------------- Performed By  Attending:        Noralee Space MD        Ref. Address:     [address]                                                             [place], [place]                                                             [address]  Performed By:     Tommie Raymond BS,       [facility]                    RDMS, RVT  [facility]  Referred By:      [facility] ---------------------------------------------------------------------- Orders  #  Description                           Code        Ordered By  1  [place] MFM  FETAL BPP WO NON               76819.01    YU FANG     STRESS  2  [place] MFM UA CORD DOPPLER                76820.02    YU FANG  3  [place] MFM OB FOLLOW UP                   16109.60    YU FANG ----------------------------------------------------------------------  #  Order #                     Accession #                Episode #  1  [id number]                   [id number]                 [id number]  2  [id number]                   [id number]                 [id number]  3  [id number]                   [id number]                 [id number] ---------------------------------------------------------------------- Indications  [redacted] weeks gestation of pregnancy                Z3A.32  Maternal care for known or suspected poor      O36.5931  fetal growth, third trimester, fetus 1 IUGR  Poor obstetric history: Previous fetal growth  O09.299  restriction (FGR)  Poor obstetric history: Previous               O09.299  preeclampsia / eclampsia/gestational HTN  Anemia during pregnancy in third trimester     O99.013  Short interval between pregancies, 3rd         O09.893  trimester  Fetal abnormality - other known or             O35.9XX0  suspected (Bowed femurs)  LOW risk NIPS,  Negative AFP  Encounter for other antenatal screening        Z36.2  follow-up ---------------------------------------------------------------------- Fetal Evaluation  Num Of Fetuses:         1  Fetal Heart Rate(bpm):  144  Cardiac Activity:       Observed  Presentation:           Cephalic  Placenta:               Anterior  P. Cord Insertion:      Previously Visualized  Amniotic Fluid  AFI FV:      Within normal limits  AFI Sum(cm)     %Tile       Largest Pocket(cm)  15.6            56          5.1  RUQ(cm)       RLQ(cm)       LUQ(cm)        LLQ(cm)  5.1           3.9           3.3            3.3 ---------------------------------------------------------------------- Biophysical Evaluation  Amniotic F.V:   Pocket => 2 cm             F. Tone:        Observed  F. Movement:     Observed                   Score:          8/8  F. Breathing:   Observed ---------------------------------------------------------------------- Biometry  BPD:      83.1  mm     G. Age:  33w 3d         81  %    CI:        80.92   %    70 - 86                                                          FL/HC:      17.8   %    19.1 - 21.3  HC:      291.7  mm     G. Age:  32w 1d         18  %    HC/AC:      1.05        0.96 - 1.17  AC:      277.3  mm     G. Age:  31w 6d         42  %    FL/BPD:     62.3   %    71 - 87  FL:       51.8  mm     G. Age:  27w 5d        < 1  %    FL/AC:      18.7   %    20 - 24  HUM:      50.9  mm     G. Age:  29w 6d        < 5  %  CER:      41.9  mm  G. Age:  33w 2d         62  %  LV:        4.1  mm  CM:        6.1  mm   RIGHT  HUM:      51.1  mm     G. Age:  30w 0d          6  %  ULN:        49  mm     G. Age:  31w 1d         23  %  TIB:      48.6  mm     G. Age:  29w 2d        < 5  %  RAD:      41.6  mm     G. Age:  28w 6d         31  %  FIB:        48  mm     G. Age:  29w 4d         41  %   LEFT  HUM:      52.1  mm     G. Age:  30w 3d         17  %  ULN:      42.5  mm     G. Age:  27w 3d        < 5  %  TIB:      49.9  mm     G. Age:  29w 6d         13  %  RAD:      42.1  mm     G. Age:  29w 1d         34  %  FIB:      49.3  mm     G. Age:  30w 1d         47  %  Est. FW:    1620  gm      3 lb 9 oz      9  % ---------------------------------------------------------------------- OB History  Gravidity:    5  Living:       2 ---------------------------------------------------------------------- Gestational Age  LMP:           32w 0d        Date:  04/07/20                 EDD:   01/12/21  U/S Today:     31w 2d                                        EDD:   01/17/21  Best:          32w 0d     Det. By:  LMP  (04/07/20)          EDD:   01/12/21 ---------------------------------------------------------------------- Anatomy  Cranium:               Appears normal         Aortic Arch:             Previously seen  Cavum:                 Appears normal  Ductal Arch:            Previously seen  Ventricles:            Appears normal         Diaphragm:              Appears normal  Choroid Plexus:        Previously seen        Stomach:                Appears normal, left                                                                        sided  Cerebellum:            Appears normal         Abdomen:                Previously seen  Posterior Fossa:       Appears normal         Abdominal Wall:         Previously seen  Nuchal Fold:           Previously seen        Cord Vessels:           Previously seen  Face:                  Orbits and profile     Kidneys:                Previously seen                         previously seen  Lips:                  Previously seen        Bladder:                Appears normal  Thoracic:              Previously seen        Spine:                  Previously seen  Heart:                 Appears normal         Upper Extremities:      Visualized                         (4CH, axis, and                         situs)  RVOT:                  Previously seen        Lower Extremities:      Visualized, see  comments  LVOT:                  Previously seen  Other:  Female gender previously seen. 3VV and 3VTV visualized previously.          adv ---------------------------------------------------------------------- Doppler - Fetal Vessels  Umbilical Artery   S/D     %tile      RI    %tile                             ADFV    RDFV   3.11       75    0.68       78                                No      No ---------------------------------------------------------------------- Cervix Uterus Adnexa  Cervix  Not visualized (advanced GA >24wks)  Uterus  No abnormality visualized.  Right Ovary  Within normal limits.  Left Ovary  Within normal limits.  Cul De Sac  No free fluid seen.  Adnexa  No adnexal mass visualized.  ---------------------------------------------------------------------- Impression  Fetal growth restriction.  On ultrasound performed 4 weeks  ago, the estimated fetal weight was at the 5th percentile.  Blood pressure today at her office is 108/61 mmHg.  Obstetric history significant for a pregnancy complicated by  fetal growth restriction and preeclampsia.  On today's ultrasound, the estimated fetal weight is at the 9th  percentile.  Female length measurement is at the 1st  percentile.  All other long bone measurements appear normal  except tibia.  No evidence of demineralization or bowing.  Amniotic fluid is normal and good fetal activity seen.  Antenatal testing is reassuring.  BPP 8/8.  Umbilical artery  Doppler showed normal forward diastolic flow.  I reassured the patient of the findings and explained the  ultrasound protocol of monitoring fetal growth restriction.  She has not had prenatal visits for several weeks.  She has  an appointment on 11/23/2020 and I encouraged her to keep  that appointment.  We discussed screening for gestational  diabetes. ---------------------------------------------------------------------- Recommendations  -Continue weekly BPP and UA Doppler till delivery. ----------------------------------------------------------------------                  Noralee Space, MD Electronically Signed Final Report   11/17/2020 10:17 am ----------------------------------------------------------------------    Medications:  Scheduled . [START ON 11/20/2020] amoxicillin  500 mg Oral TID  . aspirin EC  81 mg Oral Daily  . betamethasone acetate-betamethasone sodium phosphate  12 mg Intramuscular Q24H  . docusate sodium  100 mg Oral Daily  . prenatal multivitamin  1 tablet Oral Q1200  . valACYclovir  500 mg Oral BID   I have reviewed the patient's current medications.  ASSESSMENT:  Patient Active Problem List   Diagnosis Date Noted  . History of preterm premature rupture of membranes  (PPROM) 11/18/2020  . Supervision of high risk pregnancy, antepartum 06/26/2020  . History of pre-eclampsia in prior pregnancy, currently pregnant 06/26/2020  . Short interval between pregnancies affecting pregnancy, antepartum 06/26/2020  . History of prior pregnancy with IUGR newborn 06/26/2020  . 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 TINEA VERSICOLOR 08/21/2010  . 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 DYSPNEA ON EXERTION 08/21/2010  . ANEMIA 03/31/2007  . MURMUR 03/31/2007  . CHEST WALL PAIN, HX OF 03/31/2007  PLAN: 25 yo P1122 at [redacted]w[redacted]d with PPROM and IUGR - Patient to receive second dose of BMZ today - Continue latency antibiotics - BPP on Thursday or Friday - No sign/symptoms of chorioamnionitis - NICU consult completed. Normal 1 hour glucola - Deliver at 34 weeks or with maternal/fetal indications - Continue routine antepartum care  Aubrea Meixner 11/19/2020,6:53 AM

## 2020-11-20 ENCOUNTER — Inpatient Hospital Stay (HOSPITAL_COMMUNITY): Payer: Medicaid Other | Admitting: Anesthesiology

## 2020-11-20 ENCOUNTER — Encounter (HOSPITAL_COMMUNITY): Payer: Self-pay | Admitting: Family Medicine

## 2020-11-20 DIAGNOSIS — O09893 Supervision of other high risk pregnancies, third trimester: Secondary | ICD-10-CM | POA: Diagnosis not present

## 2020-11-20 DIAGNOSIS — O42013 Preterm premature rupture of membranes, onset of labor within 24 hours of rupture, third trimester: Secondary | ICD-10-CM | POA: Diagnosis not present

## 2020-11-20 DIAGNOSIS — B009 Herpesviral infection, unspecified: Secondary | ICD-10-CM | POA: Diagnosis not present

## 2020-11-20 DIAGNOSIS — O98513 Other viral diseases complicating pregnancy, third trimester: Secondary | ICD-10-CM | POA: Diagnosis not present

## 2020-11-20 DIAGNOSIS — Z3A32 32 weeks gestation of pregnancy: Secondary | ICD-10-CM | POA: Diagnosis not present

## 2020-11-20 DIAGNOSIS — O365930 Maternal care for other known or suspected poor fetal growth, third trimester, not applicable or unspecified: Secondary | ICD-10-CM | POA: Diagnosis not present

## 2020-11-20 DIAGNOSIS — O42913 Preterm premature rupture of membranes, unspecified as to length of time between rupture and onset of labor, third trimester: Secondary | ICD-10-CM | POA: Diagnosis not present

## 2020-11-20 DIAGNOSIS — Z3A00 Weeks of gestation of pregnancy not specified: Secondary | ICD-10-CM | POA: Diagnosis not present

## 2020-11-20 MED ORDER — DIPHENHYDRAMINE HCL 50 MG/ML IJ SOLN: 12.5000 mg | INTRAMUSCULAR | Status: DC | PRN

## 2020-11-20 MED ORDER — OXYTOCIN-SODIUM CHLORIDE 30-0.9 UT/500ML-% IV SOLN
2.5000 [IU]/h | INTRAVENOUS | Status: DC
  Filled 2020-11-20: qty 500

## 2020-11-20 MED ORDER — SIMETHICONE 80 MG PO CHEW: 80.0000 mg | CHEWABLE_TABLET | ORAL | Status: DC | PRN

## 2020-11-20 MED ORDER — LACTATED RINGERS IV SOLN
500.0000 mL | Freq: Once | INTRAVENOUS | Status: AC
  Administered 2020-11-20: 500 mL via INTRAVENOUS

## 2020-11-20 MED ORDER — ONDANSETRON HCL 4 MG/2ML IJ SOLN: 4.0000 mg | Freq: Four times a day (QID) | INTRAMUSCULAR | Status: DC | PRN

## 2020-11-20 MED ORDER — PHENYLEPHRINE 40 MCG/ML (10ML) SYRINGE FOR IV PUSH (FOR BLOOD PRESSURE SUPPORT): 80.0000 ug | PREFILLED_SYRINGE | INTRAVENOUS | Status: DC | PRN

## 2020-11-20 MED ORDER — COCONUT OIL OIL
1.0000 "application " | TOPICAL_OIL | Status: DC | PRN
  Administered 2020-11-21: 1 via TOPICAL

## 2020-11-20 MED ORDER — LACTATED RINGERS IV SOLN: 500.0000 mL | INTRAVENOUS | Status: DC | PRN

## 2020-11-20 MED ORDER — NIFEDIPINE ER OSMOTIC RELEASE 30 MG PO TB24
30.0000 mg | ORAL_TABLET | Freq: Once | ORAL | Status: AC
  Administered 2020-11-20: 30 mg via ORAL
  Filled 2020-11-20 (×2): qty 1

## 2020-11-20 MED ORDER — DIBUCAINE (PERIANAL) 1 % EX OINT: 1.0000 "application " | TOPICAL_OINTMENT | CUTANEOUS | Status: DC | PRN

## 2020-11-20 MED ORDER — FENTANYL CITRATE (PF) 100 MCG/2ML IJ SOLN
100.0000 ug | INTRAMUSCULAR | Status: DC | PRN
  Administered 2020-11-20: 100 ug via INTRAVENOUS
  Filled 2020-11-20: qty 2

## 2020-11-20 MED ORDER — OXYTOCIN BOLUS FROM INFUSION
333.0000 mL | Freq: Once | INTRAVENOUS | Status: AC
  Administered 2020-11-20: 333 mL via INTRAVENOUS

## 2020-11-20 MED ORDER — EPHEDRINE 5 MG/ML INJ: 10.0000 mg | INTRAVENOUS | Status: DC | PRN

## 2020-11-20 MED ORDER — ONDANSETRON HCL 4 MG PO TABS: 4.0000 mg | ORAL_TABLET | ORAL | Status: DC | PRN

## 2020-11-20 MED ORDER — LACTATED RINGERS IV SOLN: INTRAVENOUS | Status: DC

## 2020-11-20 MED ORDER — FERROUS SULFATE 325 (65 FE) MG PO TABS
325.0000 mg | ORAL_TABLET | ORAL | Status: DC
  Administered 2020-11-21: 325 mg via ORAL
  Filled 2020-11-20: qty 1

## 2020-11-20 MED ORDER — WITCH HAZEL-GLYCERIN EX PADS: 1.0000 "application " | MEDICATED_PAD | CUTANEOUS | Status: DC | PRN

## 2020-11-20 MED ORDER — IBUPROFEN 600 MG PO TABS
600.0000 mg | ORAL_TABLET | Freq: Four times a day (QID) | ORAL | Status: DC
  Administered 2020-11-21 – 2020-11-22 (×7): 600 mg via ORAL
  Filled 2020-11-20 (×7): qty 1

## 2020-11-20 MED ORDER — SOD CITRATE-CITRIC ACID 500-334 MG/5ML PO SOLN: 30.0000 mL | ORAL | Status: DC | PRN

## 2020-11-20 MED ORDER — FENTANYL-BUPIVACAINE-NACL 0.5-0.125-0.9 MG/250ML-% EP SOLN
12.0000 mL/h | EPIDURAL | Status: DC | PRN
  Filled 2020-11-20: qty 250

## 2020-11-20 MED ORDER — TETANUS-DIPHTH-ACELL PERTUSSIS 5-2.5-18.5 LF-MCG/0.5 IM SUSY: 0.5000 mL | PREFILLED_SYRINGE | Freq: Once | INTRAMUSCULAR | Status: DC

## 2020-11-20 MED ORDER — PRENATAL MULTIVITAMIN CH
1.0000 | ORAL_TABLET | Freq: Every day | ORAL | Status: DC
  Administered 2020-11-21 – 2020-11-22 (×2): 1 via ORAL
  Filled 2020-11-20 (×2): qty 1

## 2020-11-20 MED ORDER — LIDOCAINE HCL (PF) 1 % IJ SOLN: 30.0000 mL | INTRAMUSCULAR | Status: DC | PRN

## 2020-11-20 MED ORDER — SODIUM CHLORIDE 0.9 % IV SOLN
5.0000 10*6.[IU] | Freq: Once | INTRAVENOUS | Status: AC
  Administered 2020-11-20: 5 10*6.[IU] via INTRAVENOUS
  Filled 2020-11-20: qty 5

## 2020-11-20 MED ORDER — ONDANSETRON HCL 4 MG/2ML IJ SOLN: 4.0000 mg | INTRAMUSCULAR | Status: DC | PRN

## 2020-11-20 MED ORDER — BENZOCAINE-MENTHOL 20-0.5 % EX AERO
1.0000 "application " | INHALATION_SPRAY | CUTANEOUS | Status: DC | PRN
  Administered 2020-11-21: 1 via TOPICAL
  Filled 2020-11-20: qty 56

## 2020-11-20 MED ORDER — TRANEXAMIC ACID-NACL 1000-0.7 MG/100ML-% IV SOLN
INTRAVENOUS | Status: AC
  Administered 2020-11-20: 1000 mg
  Filled 2020-11-20: qty 100

## 2020-11-20 MED ORDER — DIPHENHYDRAMINE HCL 25 MG PO CAPS: 25.0000 mg | ORAL_CAPSULE | Freq: Four times a day (QID) | ORAL | Status: DC | PRN

## 2020-11-20 MED ORDER — PENICILLIN G POT IN DEXTROSE 60000 UNIT/ML IV SOLN: 3.0000 10*6.[IU] | INTRAVENOUS | Status: DC

## 2020-11-20 MED ORDER — SENNOSIDES-DOCUSATE SODIUM 8.6-50 MG PO TABS
2.0000 | ORAL_TABLET | Freq: Every day | ORAL | Status: DC
  Administered 2020-11-21 – 2020-11-22 (×2): 2 via ORAL
  Filled 2020-11-20 (×2): qty 2

## 2020-11-20 NOTE — Discharge Instructions (Signed)

## 2020-11-20 NOTE — Progress Notes (Signed)
FACULTY PRACTICE ANTEPARTUM(COMPREHENSIVE) NOTE  Cindy [name] is a 25 y.o. P7T0626 at [redacted]w[redacted]d by best clinical estimate who is admitted for PROM.   Fetal presentation is cephalic. Length of Stay:  2  Days  ASSESSMENT: Active Problems:   HSV-2 infection   Supervision of high risk pregnancy, antepartum   Short interval between pregnancies affecting pregnancy, antepartum   History of prior pregnancy with IUGR newborn   History of preterm premature rupture of membranes (PPROM)   PLAN: Continue inpt. Monitoring Latency Abx S/p BMZ Delivery with s/sx's of infection or worsening maternal/fetal status  Subjective: Feels well. No contractions. Patient reports the fetal movement as active. Patient reports uterine contraction  activity as none. Patient reports  vaginal bleeding as none. Patient describes fluid per vagina as Clear.  Vitals:  Blood pressure 119/61, pulse 70, temperature 98.5 F (36.9 C), temperature source Oral, resp. rate 18, height 5\' 5"  (1.651 m), weight 57.6 kg, last menstrual period 04/07/2020, SpO2 100 %, unknown if currently breastfeeding. Physical Examination:  General appearance - alert, well appearing, and in no distress Chest - normal effort Abdomen - gravid, non-tender Fundal Height:  size less than dates Extremities: Homans sign is negative, no sign of DVT  Membranes:ruptured, clear fluid  Fetal Monitoring:  Baseline: 145 bpm, Variability: Good {> 6 bpm), Accelerations: Reactive and Decelerations: Absent   Imaging Studies:      Medications:  Scheduled . amoxicillin  500 mg Oral TID  . aspirin EC  81 mg Oral Daily  . docusate sodium  100 mg Oral Daily  . prenatal multivitamin  1 tablet Oral Q1200  . valACYclovir  500 mg Oral BID   I have reviewed the patient's current medications.   04/09/2020, MD 11/20/2020,7:00 AM

## 2020-11-20 NOTE — Discharge Summary (Signed)
Postpartum Discharge Summary    Patient Name: Cindy [name] DOB: [birth year] MRN: [id number]  Date of admission: 11/18/2020 Delivery date:11/20/2020  Delivering provider: Randa Ngo  Date of discharge: 11/22/2020  Admitting diagnosis: History of preterm premature rupture of membranes (PPROM) [Z87.59] Intrauterine pregnancy: [redacted]w[redacted]d    Secondary diagnosis:  Principal Problem:   Vaginal delivery Active Problems:   HSV-2 infection   Supervision of high risk pregnancy, antepartum   Short interval between pregnancies affecting pregnancy, antepartum   History of prior pregnancy with IUGR newborn   History of preterm premature rupture of membranes (PPROM)   Preterm delivery  Additional problems: as noted above   Discharge diagnosis: Preterm Vaginal Delivery s/p PPROM                                           Post partum procedures:n/a Augmentation: none Complications: None  Hospital course: Onset of Labor With Vaginal Delivery      25y.o. yo [id number] admitted on 11/18/2020 s/p PPROM. Latency antibiotics were started on admission and she received BMZ x2 (5/28, 5/29). On 11/20/20, pt became increasingly uncomfortable with contractions. She was transferred to L&D and rapidly progressed to complete cervical dilation. She had an uncomplicated delivery s/p a brief second stage as noted below. Membrane Rupture Time/Date: 3:00 AM ,11/18/2020   Delivery Method:Vaginal, Spontaneous  Episiotomy: None  Lacerations:  None  Patient had an uncomplicated postpartum course. She is ambulating, tolerating a regular diet, passing flatus, and urinating well. Patient is discharged home in stable condition on 11/22/20. Would like outpatient Nexplanon. Undecided about circumcision.  Newborn Data: Birth date:11/20/2020  Birth time:10:17 PM  Gender:Female  Living status:Living  Apgars:9 ,9  Weight:1590 g   Magnesium Sulfate received: No BMZ received:  Yes Rhophylac:N/A MMR:N/A T-DaP:-offered prior to discharge Flu: Nooffered prior to discharge Transfusion:No  Physical exam  Vitals:   11/21/20 2133 11/21/20 2322 11/22/20 0339 11/22/20 0839  BP: 123/65 (!) 96/52 124/72 112/62  Pulse: 63 63 69 (!) 55  Resp: _0 Temp: 98.6 F (37 C) 98.5 F (36.9 C) 98.6 F (37 C) 99.1 F (37.3 C)  TempSrc: Oral Oral Oral Oral  SpO2: 100% 100% 100% 99%  Weight:      Height:       General: alert, cooperative and no distress Lochia: appropriate Uterine Fundus: firm Incision: N/A DVT Evaluation: No evidence of DVT seen on physical exam. Labs: Lab Results  Component Value Date   WBC 13.5 (H) 11/21/2020   HGB 7.6 (L) 11/21/2020   HCT 23.7 (L) 11/21/2020   MCV 92.6 11/21/2020   PLT 229 11/21/2020   CMP Latest Ref Rng & Units 07/05/2020  Glucose 65 - 99 mg/dL 85  BUN 6 - 20 mg/dL 7  Creatinine 0.57 - 1.00 mg/dL 0.72  Sodium 134 - 144 mmol/L 137  Potassium 3.5 - 5.2 mmol/L 3.6  Chloride 96 - 106 mmol/L 99  CO2 20 - 29 mmol/L 21  Calcium 8.7 - 10.2 mg/dL 9.6  Total Protein 6.0 - 8.5 g/dL 7.6  Total Bilirubin 0.0 - 1.2 mg/dL 0.3  Alkaline Phos 44 - 121 IU/L 55  AST 0 - 40 IU/L 21  ALT 0 - 32 IU/L 18   Edinburgh Score: Edinburgh Postnatal Depression Scale Screening Tool 11/20/2020  I have been able to laugh and  see the funny side of things. 0  I have looked forward with enjoyment to things. 0  I have blamed myself unnecessarily when things went wrong. 0  I have been anxious or worried for no good reason. 0  I have felt scared or panicky for no good reason. 0  Things have been getting on top of me. 0  I have been so unhappy that I have had difficulty sleeping. 0  I have felt sad or miserable. 0  I have been so unhappy that I have been crying. 0  The thought of harming myself has occurred to me. 0  Edinburgh Postnatal Depression Scale Total 0     After visit meds:  Allergies as of 11/22/2020      Reactions   Acetaminophen  Anaphylaxis, Swelling   Throat swells, couldn't breath.      Medication List    STOP taking these medications   amoxicillin 500 MG tablet Commonly known as: AMOXIL   aspirin EC 81 MG tablet   promethazine 25 MG tablet Commonly known as: PHENERGAN     TAKE these medications   albuterol 108 (90 Base) MCG/ACT inhaler Commonly known as: VENTOLIN HFA Inhale 1 puff into the lungs every 6 (six) hours as needed for wheezing or shortness of breath.   prenatal multivitamin Tabs tablet Take 1 tablet by mouth daily at 12 noon.        Discharge home in stable condition Infant Feeding: Bottle and Breast Infant Disposition:NICU Discharge instruction: per After Visit Summary and Postpartum booklet. Activity: Advance as tolerated. Pelvic rest for 6 weeks.  Diet: routine diet Future Appointments: Future Appointments  Date Time Provider [place]  11/23/2020  1:15 PM Aletha Halim, MD [facility]   Follow up Visit:  [place] [facility] [place] at [facility]. Go in 4 week(s).   Specialty: Obstetrics and Gynecology Contact information: [place] [id number] [phone]             Message sent to [facility] by Dr. Astrid Drafts.  Please schedule this patient for a In person postpartum visit in 6 weeks with the following provider: Any provider. Additional Postpartum F/U:Postpartum Depression checkup (infant in NICU) High risk pregnancy complicated by: Preterm delivery _0  s/p PPROM, IUGR, h/o HSV, Anemia Delivery mode:  Vaginal, Spontaneous  Anticipated Birth Control:  outpatient Nexplanon   11/22/2020 Sloan Leiter, MD

## 2020-11-20 NOTE — Progress Notes (Signed)
Patient ID: Cindy [name], female   DOB: 1996-03-29, 25 y.o.   MRN: [id number] Called by RN with patient becoming uncomfortable with contractions. Patient is breathing through contractions reporting them every 5 minutes  SVE: 4-5/80/-1 FHT: baseline 150, mod variability, +accels, no decels Toco: q5 minutes  A/P 25 yo P1122 at [redacted]w[redacted]d with PPROM in active labor - Patient being transferred to L&D - s/p BMZ

## 2020-11-20 NOTE — Anesthesia Preprocedure Evaluation (Deleted)
Anesthesia Evaluation  Patient identified by MRN, date of birth, ID band Patient awake    Reviewed: Allergy & Precautions, NPO status , Patient's Chart, lab work & pertinent test results  Airway Mallampati: II  TM Distance: >3 FB Neck ROM: Full    Dental no notable dental hx.    Pulmonary asthma ,    Pulmonary exam normal breath sounds clear to auscultation       Cardiovascular negative cardio ROS Normal cardiovascular exam Rhythm:Regular Rate:Normal     Neuro/Psych negative neurological ROS  negative psych ROS   GI/Hepatic negative GI ROS, Neg liver ROS,   Endo/Other  negative endocrine ROS  Renal/GU negative Renal ROS  negative genitourinary   Musculoskeletal negative musculoskeletal ROS (+)   Abdominal   Peds  Hematology  (+) Blood dyscrasia (Hgb 9.1), anemia ,   Anesthesia Other Findings Presents at [redacted]w[redacted]d with PPROM  Reproductive/Obstetrics (+) Pregnancy                             Anesthesia Physical Anesthesia Plan  ASA: III  Anesthesia Plan: Epidural   Post-op Pain Management:    Induction:   PONV Risk Score and Plan: Treatment may vary due to age or medical condition  Airway Management Planned: Natural Airway  Additional Equipment:   Intra-op Plan:   Post-operative Plan:   Informed Consent: I have reviewed the patients History and Physical, chart, labs and discussed the procedure including the risks, benefits and alternatives for the proposed anesthesia with the patient or authorized representative who has indicated his/her understanding and acceptance.       Plan Discussed with: Anesthesiologist  Anesthesia Plan Comments: (Patient identified. Risks, benefits, options discussed with patient including but not limited to bleeding, infection, nerve damage, paralysis, failed block, incomplete pain control, headache, blood pressure changes, nausea, vomiting,  reactions to medication, itching, and post partum back pain. Confirmed with bedside nurse the patient's most recent platelet count. Confirmed with the patient that they are not taking any anticoagulation, have any bleeding history or any family history of bleeding disorders. Patient expressed understanding and wishes to proceed. All questions were answered. )        Anesthesia Quick Evaluation

## 2020-11-21 ENCOUNTER — Other Ambulatory Visit: Payer: Self-pay | Admitting: *Deleted

## 2020-11-21 DIAGNOSIS — O365930 Maternal care for other known or suspected poor fetal growth, third trimester, not applicable or unspecified: Secondary | ICD-10-CM

## 2020-11-21 LAB — CBC
HCT: 23.7 % — ABNORMAL LOW (ref 36.0–46.0)
Hemoglobin: 7.6 g/dL — ABNORMAL LOW (ref 12.0–15.0)
MCH: 29.7 pg (ref 26.0–34.0)
MCHC: 32.1 g/dL (ref 30.0–36.0)
MCV: 92.6 fL (ref 80.0–100.0)
Platelets: 229 10*3/uL (ref 150–400)
RBC: 2.56 MIL/uL — ABNORMAL LOW (ref 3.87–5.11)
RDW: 13.4 % (ref 11.5–15.5)
WBC: 13.5 10*3/uL — ABNORMAL HIGH (ref 4.0–10.5)
nRBC: 0.1 % (ref 0.0–0.2)

## 2020-11-21 NOTE — Progress Notes (Signed)
OB/GYN Faculty Attending Note  Post Partum Day 1  Subjective: Patient is feeling well. She reports well controlled pain on PO pain meds. She is ambulating and denies light-headedness or dizziness. She is passing flatus. She is tolerating a regular diet without nausea/vomiting. Bleeding is minimal to moderate. She is breast/bottle feeding. Baby is in NICU and doing well.  Objective: Blood pressure 107/61, pulse 71, temperature 98.5 F (36.9 C), temperature source Oral, resp. rate 18, height 5\' 5"  (1.651 m), weight 57.6 kg, last menstrual period 04/07/2020, SpO2 100 %, unknown if currently breastfeeding. Temp:  [98.4 F (36.9 C)-99.4 F (37.4 C)] 98.5 F (36.9 C) (05/31 0740) Pulse Rate:  [68-95] 71 (05/31 0740) Resp:  [16-20] 18 (05/31 0740) BP: (106-135)/(46-68) 107/61 (05/31 0740) SpO2:  [100 %] 100 % (05/31 0740)  Physical Exam:  General: alert, oriented, cooperative Chest: normal respiratory effort Heart: RRR  Abdomen: soft, appropriately tender to palpation  Uterine Fundus: firm, 2 fingers below the umbilicus Lochia: moderate, rubra DVT Evaluation: no evidence of DVT Extremities: no edema, no calf tenderness  UOP: voiding spontaneously  Recent Labs    11/21/20 0456  HGB 7.6*  HCT 23.7*    Assessment/Plan: Patient Active Problem List   Diagnosis Date Noted  . Vaginal delivery 11/20/2020  . History of preterm premature rupture of membranes (PPROM) 11/18/2020  . Supervision of high risk pregnancy, antepartum 06/26/2020  . History of pre-eclampsia in prior pregnancy, currently pregnant 06/26/2020  . Short interval between pregnancies affecting pregnancy, antepartum 06/26/2020  . History of prior pregnancy with IUGR newborn 06/26/2020  . 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 TINEA VERSICOLOR 08/21/2010  . 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 DYSPNEA ON EXERTION 08/21/2010  . ANEMIA 03/31/2007  . MURMUR 03/31/2007  . CHEST WALL PAIN, HX OF 03/31/2007    Patient is 25 y.o.  30 PPD#1 s/p SVD at [redacted]w[redacted]d. Course complicated by PPROM on 11/18/20 with onset of labor on 11/20/20. IUGR in pregnancy. She is doing very well, recovering appropriately and no complaints. BP well controlled (h/o PEC). H/H drop but asymptomatic and tolerating PO iron well.  PO iron for acute blood loss anemia/anemia of pregnancy Continue routine post partum care Pain meds prn Regular diet Nexplanon as outpatient for birth control Still considering circ for baby Plan for discharge tomorrow   K. 11/22/20, MD, [facility] Attending Center for [facility] (Faculty Practice)  11/21/2020, 10:41 AM

## 2020-11-21 NOTE — Lactation Note (Signed)
This note was copied from a baby's chart. Lactation Consultation Note  Patient Name: Cindy [name] [id number] Date: 11/21/2020 Reason for consult: Initial assessment;Preterm <34wks Age:27 hours P3, preterm female infant in NICU. Mom has been using DEBP and expressed  5 mls of colostrum that  was taken to NICU. Mom knows to pump every 3 hours for 15 minutes on initial setting. Mom was  shown how to use DEBP & how to disassemble, clean, & reassemble parts. Mom will follow NICU infant feeding guidelines. Mom knows to call [facility] services if she has any BF questions or concerns. Mom made aware of O/P services, breastfeeding support groups, community resources, and our phone # for post-discharge questions.  Maternal Data Has patient been taught Hand Expression?: Yes Does the patient have breastfeeding experience prior to this delivery?: Yes How long did the patient breastfeed?: Per mom, she BF 1st child for one month and 2nd child who is one year for 2 months, 2nd child was in NICU.  Feeding Mother's Current Feeding Choice: Breast Milk  LATCH Score                    Lactation Tools Discussed/Used Tools: Pump Breast pump type: Double-Electric Breast Pump Pump Education: Setup, frequency, and cleaning Reason for Pumping: Infant in NICU and preterm. Pumping frequency: Mom knows to pump every 3 hours for 15 minutes on inital setting, Pumped volume: 5 mL  Interventions Interventions: Breast feeding basics reviewed;DEBP;Education;Expressed milk;Hand express  Discharge Pump: Personal;DEBP WIC Program: Yes  Consult Status Consult Status: Follow-up Date: 11/21/20 Follow-up type: In-patient    Cindy [name] 11/21/2020, 1:20 AM

## 2020-11-21 NOTE — Progress Notes (Signed)
Patient screened out for psychosocial assessment since none of the following apply:  Psychosocial stressors documented in mother or baby's chart  Gestation less than 32 weeks  Code at delivery   Infant with anomalies Please contact the Clinical Social Worker if specific needs arise, by MOB's request, or if MOB scores greater than 9/yes to question 10 on Edinburgh Postpartum Depression Screen.  Tesneem Dufrane, LCSW Clinical Social Worker [facility] Cell#: [phone]     

## 2020-11-21 NOTE — Lactation Note (Addendum)
This note was copied from a baby's chart. Lactation Consultation Note  Patient Name: Boy Leocadia Idleman RTMYT'R Date: 11/21/2020   Age:27 hours P3, Preterm female infant in NICU. LC attempted to visit mom, LC entered room mom asleep at this time. Per RN Leotis Shames) [facility] mom has been set up with DEBP, has pumped 5 mls that was taken to NICU. Mom knows to pump every 3 hours for 15 minutes on initial setting.  Maternal Data    Feeding    LATCH Score                    Lactation Tools Discussed/Used    Interventions    Discharge    Consult Status      Danelle Earthly 11/21/2020, 12:58 AM

## 2020-11-21 NOTE — Lactation Note (Signed)
This note was copied from a baby's chart. Lactation Consultation Note LC to mother's room for f/u visit. Mother is pumping q3 without difficulty. Reinforced earlier teaching.Will plan f/u visit.   Patient Name: Boy Morris Markham SKAJG'O Date: 11/21/2020 Reason for consult: NICU baby;Follow-up assessment Age:27 hours   Feeding Mother's Current Feeding Choice: Breast Milk   Lactation Tools Discussed/Used Pump Education: Setup, frequency, and cleaning  Consult Status Consult Status: Follow-up Follow-up type: In-patient   Elder Negus, MA IBCLC 11/21/2020, 5:06 PM

## 2020-11-22 LAB — SURGICAL PATHOLOGY

## 2020-11-22 NOTE — Lactation Note (Signed)
This note was copied from a baby's chart. Lactation Consultation Note LC to mother's room for f/u visit and d/c ed. Will plan f/u in NICU.  Patient Name: Boy Myrtice Lowdermilk [id number]'U Date: 11/22/2020 Reason for consult: NICU baby;Follow-up assessment;Other (Comment) (maternal discharge) Age:27 hours  Feeding Mother's Current Feeding Choice: Breast Milk and Donor Milk  Lactation Tools Discussed/Used Pump Education: Setup, frequency, and cleaning Pumping frequency: q3  Interventions Interventions: Education;DEBP Reviewed IDF Discharge Discharge Education: Engorgement and breast care Pump: Personal (symphony) WIC Program: Yes  Consult Status Consult Status: Follow-up Follow-up type: In-patient   Elder Negus, MA IBCLC 11/22/2020, 10:37 AM

## 2020-11-22 NOTE — Plan of Care (Signed)
  Problem: Education: Goal: Knowledge of condition will improve Outcome: Adequate for Discharge Goal: Individualized Educational Video(s) Outcome: Adequate for Discharge Goal: Individualized Newborn Educational Video(s) Outcome: Adequate for Discharge   Problem: Activity: Goal: Will verbalize the importance of balancing activity with adequate rest periods Outcome: Adequate for Discharge Goal: Ability to tolerate increased activity will improve Outcome: Adequate for Discharge   Problem: Coping: Goal: Ability to identify and utilize available resources and services will improve Outcome: Adequate for Discharge   Problem: Life Cycle: Goal: Chance of risk for complications during the postpartum period will decrease Outcome: Adequate for Discharge   Problem: Role Relationship: Goal: Ability to demonstrate positive interaction with newborn will improve Outcome: Adequate for Discharge   Problem: Skin Integrity: Goal: Demonstration of wound healing without infection will improve Outcome: Adequate for Discharge   

## 2020-11-23 ENCOUNTER — Encounter: Payer: Medicaid Other | Admitting: Obstetrics and Gynecology

## 2020-11-23 ENCOUNTER — Telehealth: Payer: Self-pay

## 2020-11-23 NOTE — Telephone Encounter (Signed)
Transition Care Management Follow-up Telephone Call  Date of discharge and from where: 11/22/20 from [facility]  How have you been since you were released from the hospital? Pt stated that she is feeling well and has no questions at this time.   Any questions or concerns? No  Items Reviewed:  Did the pt receive and understand the discharge instructions provided? Yes   Medications obtained and verified? Yes   Other? No   Any new allergies since your discharge? No   Dietary orders reviewed? n/a  Do you have support at home? Yes   Functional Questionnaire: (I = Independent and D = Dependent) ADLs: I  Bathing/Dressing- I  Meal Prep- I  Eating- I  Maintaining continence- I  Transferring/Ambulation- I  Managing Meds- I   Follow up appointments reviewed:   PCP Hospital f/u appt confirmed? No    Specialist Hospital f/u appt confirmed? No    Are transportation arrangements needed? No   If their condition worsens, is the pt aware to call PCP or go to the Emergency Dept.? Yes  Was the patient provided with contact information for the PCP's office or ED? Yes  Was to pt encouraged to call back with questions or concerns? Yes

## 2020-11-24 ENCOUNTER — Ambulatory Visit: Payer: Medicaid Other

## 2020-11-24 ENCOUNTER — Ambulatory Visit: Payer: Self-pay

## 2020-11-24 LAB — CULTURE, BETA STREP (GROUP B ONLY)

## 2020-11-24 NOTE — Lactation Note (Signed)
This note was copied from a baby's chart. Lactation Consultation Note  Patient Name: Cindy [name] [id number] Date: 11/24/2020 Reason for consult: Follow-up assessment;NICU baby;Preterm <34wks;Infant < 6lbs Age:27 days   LC in to assist Mom with missing pump parts.  Mom's milk coming to volume, breasts filling, not engorged.    Mom has been consistently pumping and volume increasing over 30 ml.  Mom to use maintenance setting and pump for 15-30 mins now.  Hand's free pumping band made for Mom and encouraged her to massage/compress during pumping.    Mom aware of goal of 8 times per 24 hrs for pumping.  Bins provided for washing and drying of pump parts.  Mom aware of lactation support and encouraged to ask her baby's RN for LC prn.   Lactation Tools Discussed/Used Tools: Pump;Flanges Flange Size: 24 Breast pump type: Double-Electric Breast Pump Pump Education: Setup, frequency, and cleaning Pumping frequency: Q 2hr during day and 3-4 hrs at night Pumped volume: 30 mL  Interventions Interventions: Skin to skin;Breast massage;Hand express;Education;Hand pump;DEBP (hand's free pumping band provided)   Consult Status Consult Status: Follow-up Date: 11/25/20 Follow-up type: In-patient    Judee Clara 11/24/2020, 4:14 PM

## 2020-11-27 ENCOUNTER — Other Ambulatory Visit: Payer: Medicaid Other

## 2020-12-01 ENCOUNTER — Ambulatory Visit: Payer: Medicaid Other

## 2020-12-04 ENCOUNTER — Other Ambulatory Visit: Payer: Medicaid Other

## 2020-12-08 ENCOUNTER — Ambulatory Visit: Payer: Medicaid Other

## 2020-12-15 ENCOUNTER — Ambulatory Visit: Payer: Medicaid Other

## 2021-01-04 ENCOUNTER — Ambulatory Visit: Payer: Medicaid Other | Admitting: Obstetrics and Gynecology

## 2021-01-18 ENCOUNTER — Other Ambulatory Visit: Payer: Self-pay

## 2021-01-18 ENCOUNTER — Ambulatory Visit (INDEPENDENT_AMBULATORY_CARE_PROVIDER_SITE_OTHER): Payer: Medicaid Other | Admitting: Obstetrics and Gynecology

## 2021-01-18 ENCOUNTER — Other Ambulatory Visit (HOSPITAL_COMMUNITY)
Admission: RE | Admit: 2021-01-18 | Discharge: 2021-01-18 | Disposition: A | Discharge status: Home / Self Care | Payer: Medicaid Other | Source: Ambulatory Visit | Attending: Obstetrics and Gynecology | Admitting: Obstetrics and Gynecology

## 2021-01-18 ENCOUNTER — Encounter: Payer: Self-pay | Admitting: Obstetrics and Gynecology

## 2021-01-18 VITALS — BP 116/67 | HR 83 | Ht 65.0 in | Wt 126.5 lb

## 2021-01-18 DIAGNOSIS — Z3202 Encounter for pregnancy test, result negative: Secondary | ICD-10-CM

## 2021-01-18 DIAGNOSIS — N898 Other specified noninflammatory disorders of vagina: Secondary | ICD-10-CM

## 2021-01-18 DIAGNOSIS — Z30013 Encounter for initial prescription of injectable contraceptive: Secondary | ICD-10-CM | POA: Diagnosis not present

## 2021-01-18 LAB — POCT PREGNANCY, URINE: Preg Test, Ur: NEGATIVE

## 2021-01-18 MED ORDER — MEDROXYPROGESTERONE ACETATE 150 MG/ML IM SUSP
150.0000 mg | Freq: Once | INTRAMUSCULAR | Status: AC
  Administered 2021-01-18: 150 mg via INTRAMUSCULAR

## 2021-01-18 NOTE — Progress Notes (Signed)
Post Partum Visit Note  Cindy [name] is a 25 y.o. [phone] female who presents for a postpartum visit. She is 8 weeks postpartum following a normal spontaneous vaginal delivery.  I have fully reviewed the prenatal and intrapartum course. The delivery was at 32/3 gestational weeks.  Anesthesia: IV sedation. Postpartum course has been uncomplicated. Baby is doing well. Baby is feeding by bottle - [employer] Good Start. Bleeding no bleeding. Bowel function is normal. Bladder function is normal. Patient is sexually active. Contraception method is condoms. Postpartum depression screening: negative.   The pregnancy intention screening data noted above was reviewed. Potential methods of contraception were discussed. The patient elected to proceed with No data recorded.   Edinburgh Postnatal Depression Scale - 01/18/21 1525       Edinburgh Postnatal Depression Scale:  In the Past 7 Days   I have been able to laugh and see the funny side of things. 0    I have looked forward with enjoyment to things. 0    I have blamed myself unnecessarily when things went wrong. 0    I have been anxious or worried for no good reason. 0    I have felt scared or panicky for no good reason. 0    Things have been getting on top of me. 0    I have been so unhappy that I have had difficulty sleeping. 0    I have felt sad or miserable. 0    I have been so unhappy that I have been crying. 0    The thought of harming myself has occurred to me. 0    Edinburgh Postnatal Depression Scale Total 0             Health Maintenance Due  Topic Date Due   TETANUS/TDAP  11/01/2017       Review of Systems Pertinent items noted in HPI and remainder of comprehensive ROS otherwise negative.  Objective:  BP 116/67   Pulse 83   Ht 5\' 5"  (1.651 m)   Wt 126 lb 8 oz (57.4 kg)   LMP 04/07/2020 (Exact Date)   BMI 21.05 kg/m    General:  alert, cooperative, and no distress   Breasts:  normal  Lungs: clear to  auscultation bilaterally  Heart:  regular rate and rhythm  Abdomen: soft, non-tender; bowel sounds normal; no masses,  no organomegaly   Wound   GU exam:  normal       Assessment:    There are no diagnoses linked to this encounter.  Normal postpartum exam.   Plan:   Essential components of care per ACOG recommendations:  1.  Mood and well being: Patient with negative depression screening today. Reviewed local resources for support.  - Patient tobacco use? No.   - hx of drug use? No.    2. Infant care and feeding:  -Patient currently breastmilk feeding? No.  -Social determinants of health (SDOH) reviewed in EPIC. No concerns  3. Sexuality, contraception and birth spacing - Patient does not want a pregnancy in the next year.   - Reviewed forms of contraception in tiered fashion. Patient desired Depo-Provera today.   - Discussed birth spacing of 18 months  4. Sleep and fatigue -Encouraged family/partner/community support of 4 hrs of uninterrupted sleep to help with mood and fatigue  5. Physical Recovery  - Discussed patients delivery and complications.  - Patient had a Vaginal, no problems at delivery.  - Patient has urinary incontinence? No. -  Patient is safe to resume physical and sexual activity  6.  Health Maintenance - HM due items addressed  - Last pap smear  Diagnosis  Date Value Ref Range Status  07/05/2020   Final   - Negative for intraepithelial lesion or malignancy (NILM)   Pap smear not done at today's visit.  -Breast Cancer screening indicated? No.   7. Chronic Disease/Pregnancy Condition follow up: None  - PCP follow up  Catalina Antigua, MD [facility], [facility]

## 2021-01-18 NOTE — Progress Notes (Signed)
Pt wants Depo for [employer].

## 2021-01-19 LAB — CERVICOVAGINAL ANCILLARY ONLY
Bacterial Vaginitis (gardnerella): NEGATIVE
Candida Glabrata: NEGATIVE
Candida Vaginitis: POSITIVE — AB
Chlamydia: NEGATIVE
Comment: NEGATIVE
Comment: NEGATIVE
Comment: NEGATIVE
Comment: NEGATIVE
Comment: NEGATIVE
Comment: NORMAL
Neisseria Gonorrhea: NEGATIVE
Trichomonas: NEGATIVE

## 2021-01-20 LAB — HIV ANTIBODY (ROUTINE TESTING W REFLEX): HIV Screen 4th Generation wRfx: NONREACTIVE

## 2021-01-20 LAB — HEPATITIS C ANTIBODY: Hep C Virus Ab: 0.2 s/co ratio (ref 0.0–0.9)

## 2021-01-20 LAB — HEPATITIS B SURFACE ANTIGEN: Hepatitis B Surface Ag: NEGATIVE

## 2021-01-20 LAB — RPR: RPR Ser Ql: NONREACTIVE

## 2021-01-22 MED ORDER — TERCONAZOLE 0.8 % VA CREA: 1.0000 | TOPICAL_CREAM | Freq: Every day | VAGINAL | 0 refills | Status: DC

## 2021-01-22 NOTE — Addendum Note (Signed)
Addended by: Catalina Antigua on: 01/22/2021 09:44 AM   Modules accepted: Orders

## 2021-04-09 ENCOUNTER — Ambulatory Visit (INDEPENDENT_AMBULATORY_CARE_PROVIDER_SITE_OTHER): Payer: Medicaid Other

## 2021-04-09 ENCOUNTER — Other Ambulatory Visit: Payer: Self-pay

## 2021-04-09 VITALS — BP 102/67 | HR 78 | Ht 66.0 in | Wt 121.1 lb

## 2021-04-09 DIAGNOSIS — Z3042 Encounter for surveillance of injectable contraceptive: Secondary | ICD-10-CM | POA: Diagnosis not present

## 2021-04-09 MED ORDER — MEDROXYPROGESTERONE ACETATE 150 MG/ML IM SUSP
150.0000 mg | Freq: Once | INTRAMUSCULAR | Status: AC
  Administered 2021-04-09: 150 mg via INTRAMUSCULAR

## 2021-04-09 NOTE — Progress Notes (Signed)
Cindy [name] here for Depo-Provera Injection. Injection administered without complication. Patient will return in 3 months for next injection between Jan 2 and Jan 16,2023. Next annual visit due June 2023.   Isabell Jarvis, RN 04/09/2021  9:07 AM

## 2021-04-09 NOTE — Progress Notes (Signed)
Chart reviewed for nurse visit. Agree with plan of care.   Marylene Land, CNM 04/09/2021 9:34 AM

## 2021-06-27 ENCOUNTER — Ambulatory Visit: Payer: Medicaid Other

## 2021-08-21 ENCOUNTER — Other Ambulatory Visit: Payer: Self-pay

## 2021-08-21 ENCOUNTER — Encounter (HOSPITAL_COMMUNITY): Payer: Self-pay

## 2021-08-21 ENCOUNTER — Ambulatory Visit (HOSPITAL_COMMUNITY)
Admission: EM | Admit: 2021-08-21 | Discharge: 2021-08-21 | Disposition: A | Discharge status: Home / Self Care | Payer: Medicaid Other | Attending: Nurse Practitioner | Admitting: Nurse Practitioner

## 2021-08-21 DIAGNOSIS — J302 Other seasonal allergic rhinitis: Secondary | ICD-10-CM

## 2021-08-21 DIAGNOSIS — H6123 Impacted cerumen, bilateral: Secondary | ICD-10-CM | POA: Diagnosis not present

## 2021-08-21 MED ORDER — FLUTICASONE PROPIONATE 50 MCG/ACT NA SUSP: 2.0000 | Freq: Every day | NASAL | 0 refills | Status: DC

## 2021-08-21 MED ORDER — AMOXICILLIN-POT CLAVULANATE 875-125 MG PO TABS: 1.0000 | ORAL_TABLET | Freq: Two times a day (BID) | ORAL | 0 refills | Status: DC

## 2021-08-21 MED ORDER — CETIRIZINE HCL 10 MG PO TABS: 10.0000 mg | ORAL_TABLET | Freq: Every day | ORAL | 1 refills | Status: DC

## 2021-08-21 NOTE — ED Provider Notes (Signed)
[facility]    CSN: [id number] Arrival date & time: 08/21/21  1527      History   Chief Complaint Chief Complaint  Patient presents with   Ear Drainage    HPI Cindy [name] is a 26 y.o. female.   The patient is a 26 year old female who presents with left-sided ear pain and sore throat.  Her throat started approximately 3 days ago.  She states symptoms had improved but worsened this morning.  She woke up with left-sided ear pain today.  She complains of pain that rates 10/10 in the left ear.  She also complains of muffled hearing, and pain that worsens with sound.  She reports that she has a history of cerumen impaction and had to have her ears irrigated before.  She also complains of nasal congestion, runny nose, postnasal drainage, and a mild cough.  Cough is productive of thick white sputum.  She denies fever, chills, headache, sinus pain, sinus pressure, or GI symptoms.  She did not feel like she had a history of seasonal allergies, but she feels like her symptoms are suggestive of the same.  She has not taken anything for her symptoms at this time.   Otalgia Location:  Left Quality:  Aching Severity:  Severe Onset quality:  Sudden Relieved by:  None tried Worsened by:  Coughing, position and cold air Ineffective treatments:  None tried Associated symptoms: cough, ear discharge (wax colored), rhinorrhea and sore throat   Associated symptoms: no fever, no headaches and no hearing loss   Cough:    Cough characteristics:  Productive   Sputum characteristics:  White   Severity:  Mild   Progression:  Waxing and waning Sore throat:    Severity:  Mild   Timing:  Intermittent   Progression:  Waxing and waning Risk factors: no chronic ear infection and no prior ear surgery    Past Medical History:  Diagnosis Date   Asthma    Heart murmur    Infection    UTI   Kidney stone    Kidney stones    Preeclampsia     Patient Active Problem List   Diagnosis Date  Noted   Preterm delivery 11/22/2020   Vaginal delivery 11/20/2020   History of preterm premature rupture of membranes (PPROM) 11/18/2020   Supervision of high risk pregnancy, antepartum 06/26/2020   History of pre-eclampsia in prior pregnancy, currently pregnant 06/26/2020   Short interval between pregnancies affecting pregnancy, antepartum 06/26/2020   History of prior pregnancy with IUGR newborn 06/26/2020   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 Authorizing Provider  albuterol (VENTOLIN HFA) 108 (90 Base) MCG/ACT inhaler Inhale 1 puff  into the lungs every 6 (six) hours as needed for wheezing or shortness of breath.    [provider]    Family History Family History  Problem Relation Age of Onset   Healthy Mother    Healthy Father     Social History Social History   Tobacco Use   Smoking status: Never   Smokeless tobacco: Never  Vaping Use   Vaping Use: Never used  Substance Use Topics   Alcohol use: No   Drug use: Not Currently    Types: Marijuana    Comment: 08/2020     Allergies   Acetaminophen   Review of Systems Review of Systems  Constitutional: Negative.  Negative for fever.  HENT:  Positive for ear discharge (wax colored), ear pain, rhinorrhea and sore throat. Negative for hearing loss.   Respiratory:  Positive for cough.   Cardiovascular: Negative.   Skin: Negative.   Neurological:  Negative for headaches.   Psychiatric/Behavioral: Negative.      Physical Exam Triage Vital Signs ED Triage Vitals  Enc Vitals Group     BP 08/21/21 1546 116/77     Pulse Rate 08/21/21 1546 69     Resp 08/21/21 1546 18     Temp 08/21/21 1546 99 F (37.2 C)     Temp Source 08/21/21 1546 Oral     SpO2 08/21/21 1546 99 %     Weight --      Height --      Head Circumference --      Peak Flow --      Pain Score 08/21/21 1547 8     Pain Loc --      Pain Edu? --      Excl. in GC? --    No data found.  Updated Vital Signs BP 116/77 (BP Location: Left Arm)    Pulse 69    Temp 99 F (37.2 C) (Oral)    Resp 18    SpO2 99%    Breastfeeding No   Visual Acuity Right Eye Distance:   Left Eye Distance:   Bilateral Distance:    Right Eye Near:   Left Eye Near:    Bilateral Near:     Physical Exam Constitutional:      General: Distressed: uncomfortable d/t left ear pain.     Appearance: Normal appearance. She is normal weight.  HENT:     Right Ear: External ear normal. Decreased hearing noted. There is impacted cerumen.     Left Ear: External ear normal. Decreased hearing noted. Drainage and tenderness present. There is impacted cerumen.     Ears:     Comments: Unable to visualize TMs bilaterally.    Nose: Congestion and rhinorrhea present. No nasal tenderness.     Right Turbinates: Enlarged and swollen.     Left Turbinates: Enlarged and swollen.     Right Sinus: No maxillary sinus tenderness or frontal sinus tenderness.     Left Sinus: No maxillary sinus tenderness or frontal sinus tenderness.     Mouth/Throat:     Mouth: Mucous membranes are moist.     Pharynx: Posterior oropharyngeal erythema present. No oropharyngeal exudate.  Eyes:     Extraocular Movements: Extraocular movements intact.     Pupils: Pupils are equal, round, and reactive to light.  Cardiovascular:     Rate and Rhythm: Normal rate and regular rhythm.  Pulmonary:     Effort: Pulmonary effort is normal.     Breath sounds: Normal  breath sounds.  Abdominal:     General: Bowel sounds are normal.     Palpations: Abdomen is soft.  Musculoskeletal:     Cervical back: Normal range of motion and neck supple.  Neurological:     Mental Status: She is alert and oriented to person, place, and time.  Psychiatric:        Mood and Affect: Mood normal.        Behavior: Behavior normal.        Thought Content: Thought content normal.        Judgment: Judgment normal.     UC Treatments / Results  Labs (all labs ordered are listed, but only abnormal results are displayed) Labs Reviewed - No data to display  EKG   Radiology No results found.  Procedures Ear Cerumen Removal  Date/Time: 08/21/2021 4:39 PM Performed by: Abran Cantor, NP Authorized by: Abran Cantor, NP   Consent:    Consent obtained:  Verbal   Risks discussed:  Infection, dizziness and TM perforation Universal protocol:    Procedure explained and questions answered to patient or proxy's satisfaction: yes     Patient identity confirmed:  Verbally with patient Procedure details:    Location:  L ear and R ear   Procedure type: irrigation     Procedure outcomes: cerumen removed   Post-procedure details:    Inspection:  Some cerumen remaining (left otitis media)   Hearing quality:  Improved   Procedure completion:  Tolerated (including critical care time)  Medications Ordered in UC Medications - No data to display  Initial Impression / Assessment and Plan / UC Course  I have reviewed the triage vital signs and the nursing notes.  Pertinent labs & imaging results that were available during my care of the patient were reviewed by me and considered in my medical decision making (see chart for details).  Bilateral cerumen impaction removed with ear irrigation.  Left tympanic membrane with pus appearing effusion suggestive of OM. We will also treat patient for seasonal allergies.  Will prescribe Augmentin for symptoms.  We will  also prescribe Flonase to also treat bilateral middle ear effusion.  Patient encouraged to continue use of ibuprofen or Tylenol as needed for pain, fever, or general discomfort.  Patient also recommended to use over-the-counter earwax softener and gentle irrigation to prevent future cerumen impactions.  Patient verbalizes understanding all questions answered.   Final Clinical Impressions(s) / UC Diagnoses   Final diagnoses:  None   Discharge Instructions   None    ED Prescriptions   None    PDMP not reviewed this encounter.   Abran Cantor, NP 08/21/21 1650

## 2021-08-21 NOTE — Discharge Instructions (Addendum)
Take medications as prescribed. May use warm compresses to the left ear as needed for pain and comfort. Ibuprofen or Tylenol for pain, fever, or general discomfort. Medications provided for seasonal allergies.  Take medications daily to prevent symptom exacerbation. Follow-up if symptoms worsen or do not improve.

## 2021-08-21 NOTE — ED Triage Notes (Signed)
Pt c/o sore throat since Sunday and woke up today with a severe lt earache. Denies taking anything for sx's

## 2021-10-23 ENCOUNTER — Ambulatory Visit: Payer: Medicaid Other

## 2022-04-10 DIAGNOSIS — N390 Urinary tract infection, site not specified: Secondary | ICD-10-CM | POA: Diagnosis not present

## 2022-04-10 DIAGNOSIS — Z0389 Encounter for observation for other suspected diseases and conditions ruled out: Secondary | ICD-10-CM | POA: Diagnosis not present

## 2022-04-10 DIAGNOSIS — N760 Acute vaginitis: Secondary | ICD-10-CM | POA: Diagnosis not present

## 2022-04-10 DIAGNOSIS — Z113 Encounter for screening for infections with a predominantly sexual mode of transmission: Secondary | ICD-10-CM | POA: Diagnosis not present

## 2022-04-10 DIAGNOSIS — Z114 Encounter for screening for human immunodeficiency virus [HIV]: Secondary | ICD-10-CM | POA: Diagnosis not present

## 2022-04-23 IMAGING — US US MFM OB DETAIL+14 WK
1 series · 13 of 28 positions shown · non-contrast
Comparison: none

[Series 1: us mfm ob detail+14 wk · 13 of 126 slices shown]
[im 5/126]
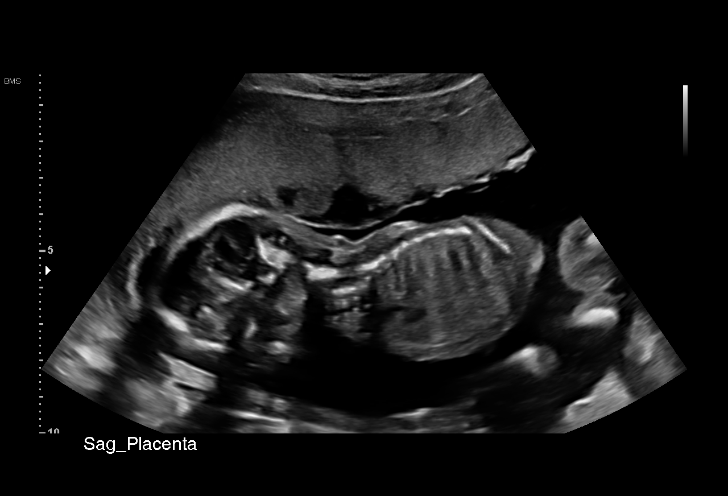
[im 14/126]
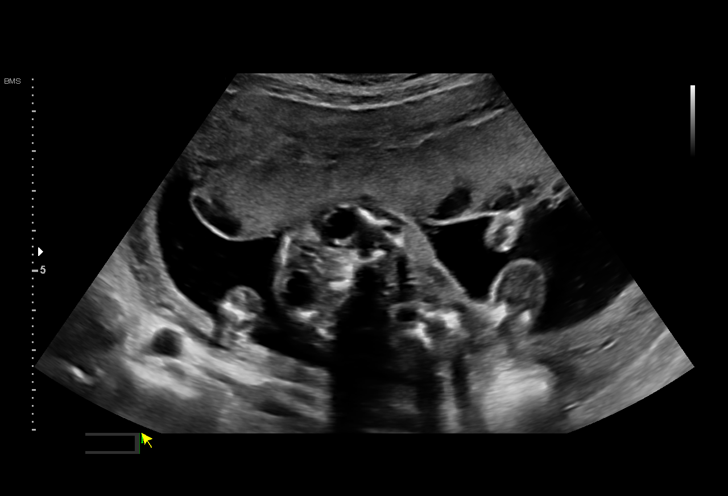
[im 24/126]
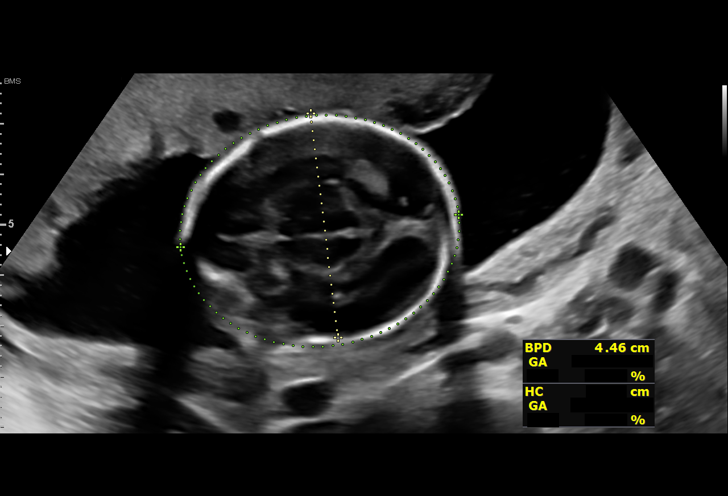
[im 33/126]
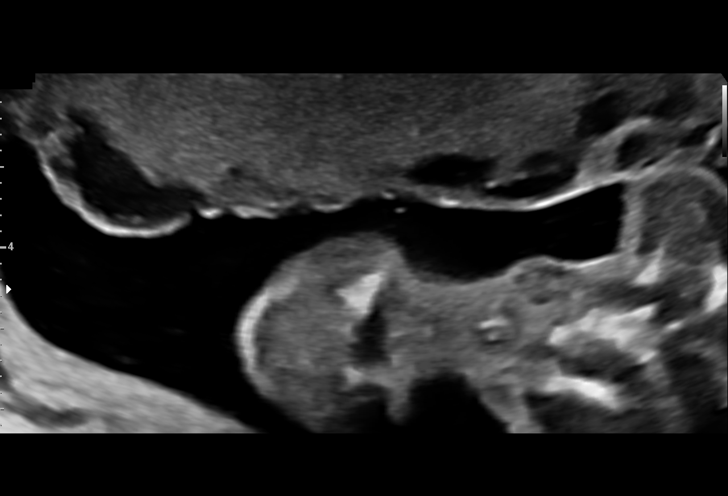
[im 42/126]
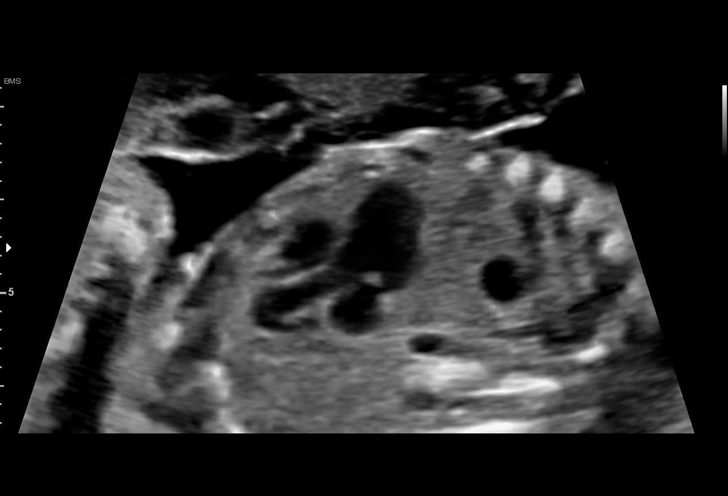
[im 51/126]
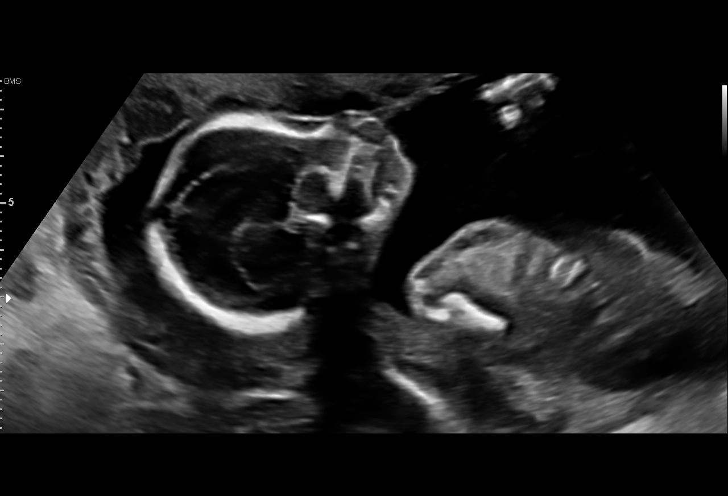
[im 65/126]
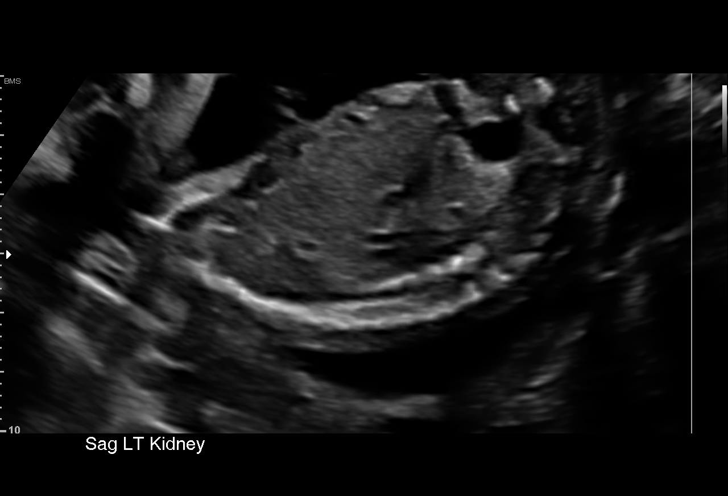
[im 75/126]
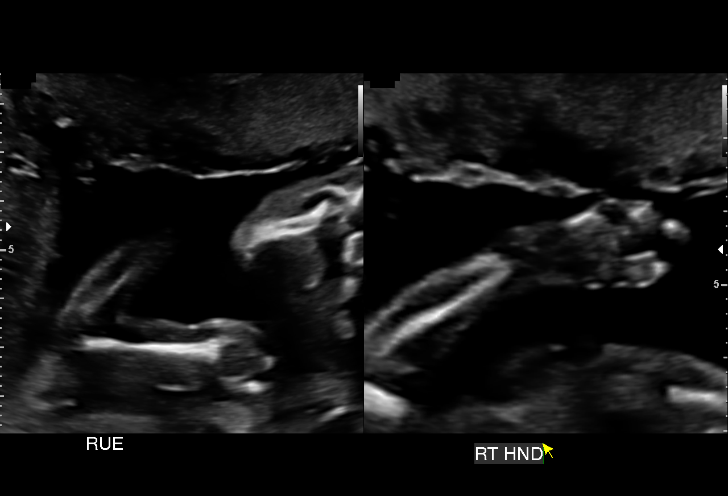
[im 84/126]
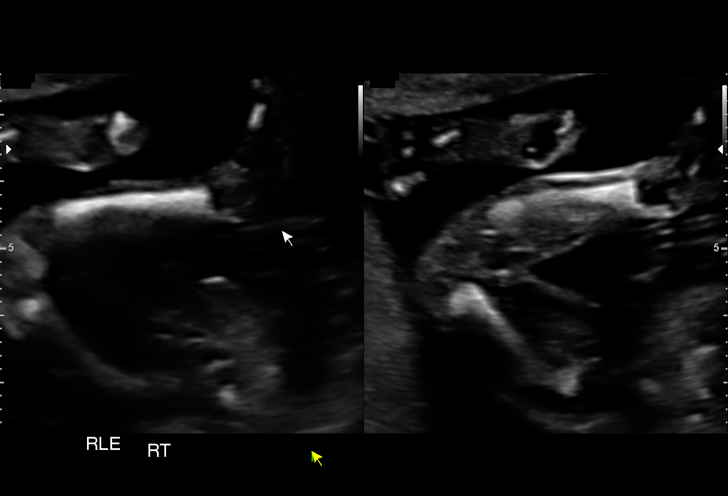
[im 93/126]
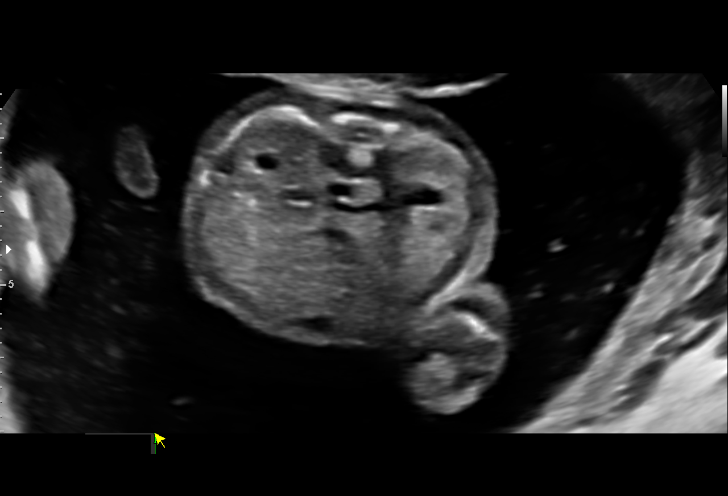
[im 102/126]
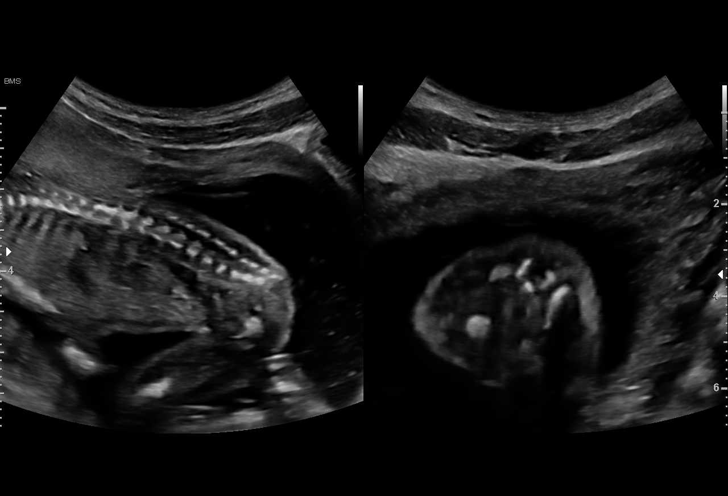
[im 112/126]
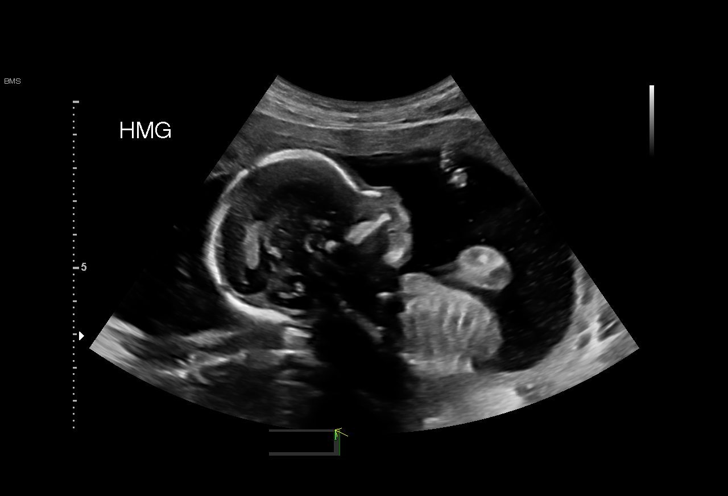
[im 121/126]
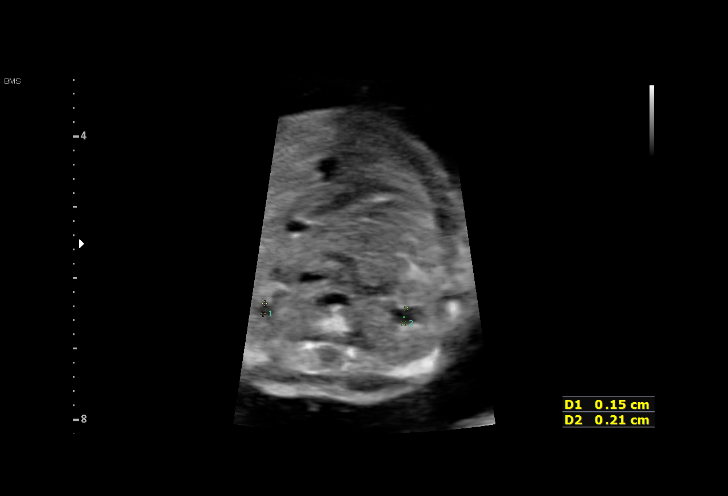

[13 of 28 positions shown; findings below may reference images not displayed]

Indications

 Encounter for antenatal screening for
 malformations (LR NIPS, Neg AFP)
 Poor obstetric history: Previous
 preeclampsia / eclampsia/gestational HTN
 Short interval between pregancies, 2nd
 trimester
 Anemia during pregnancy in second trimester
 Poor obstetric history: Previous fetal growth
 restriction (FGR)
 19 weeks gestation of pregnancy
Fetal Evaluation

 Num Of Fetuses:         1
 Fetal Heart Rate(bpm):  153
 Cardiac Activity:       Observed
 Presentation:           Breech
 Placenta:               Anterior
 P. Cord Insertion:      Visualized, central

 Amniotic Fluid
 AFI FV:      Within normal limits

                             Largest Pocket(cm)

Biometry
 BPD:      44.6  mm     G. Age:  19w 3d         71  %    CI:        79.59   %    70 - 86
                                                         FL/HC:      16.5   %    16.1 -
 HC:       158   mm     G. Age:  18w 5d         27  %    HC/AC:      1.13        1.09 -
 AC:      139.6  mm     G. Age:  19w 2d         58  %    FL/BPD:     58.5   %
 FL:       26.1  mm     G. Age:  18w 0d         11  %    FL/AC:      18.7   %    20 - 24
 HUM:      26.6  mm     G. Age:  18w 3d         34  %
 CER:      19.6  mm     G. Age:  19w 0d         36  %
 NFT:       3.9  mm

 LV:        4.6  mm
 CM:        4.8  mm

 Est. FW:     254  gm      0 lb 9 oz     30  %
OB History

 Gravidity:    5
 Living:       2
Gestational Age

 LMP:           19w 0d        Date:  04/07/20                 EDD:   01/12/21
 U/S Today:     18w 6d                                        EDD:   01/13/21
 Best:          19w 0d     Det. By:  LMP  (04/07/20)          EDD:   01/12/21
Anatomy

 Cranium:               Appears normal         Aortic Arch:            Appears normal
 Cavum:                 Appears normal         Ductal Arch:            Appears normal
 Ventricles:            Appears normal         Diaphragm:              Appears normal
 Choroid Plexus:        Appears normal         Stomach:                Appears normal, left
                                                                       sided
 Cerebellum:            Appears normal         Abdomen:                Appears normal
 Posterior Fossa:       Appears normal         Abdominal Wall:         Appears nml (cord
                                                                       insert, abd wall)
 Nuchal Fold:           Appears normal         Cord Vessels:           Appears normal (3
                                                                       vessel cord)
 Face:                  Appears normal         Kidneys:                Appear normal
                        (orbits and profile)
 Lips:                  Appears normal         Bladder:                Appears normal
 Thoracic:              Appears normal         Spine:                  Appears normal
 Heart:                 Appears normal         Upper Extremities:      Appears normal
                        (4CH, axis, and
                        situs)
 RVOT:                  Appears normal         Lower Extremities:      Appears normal
 LVOT:                  Appears normal

 Other:  Hands and feet visualized. Open hands visualized. Lenses visualized.
         Nasal bone visualized. Fetus appears to be a male.
Cervix Uterus Adnexa

 Cervix
 Length:           3.15  cm.
 Normal appearance by transabdominal scan.
 Uterus
 No abnormality visualized.

 Right Ovary
 Not visualized.

 Left Ovary
 Not visualized.

 Cul De Sac
 No free fluid seen.

 Adnexa
 No adnexal mass visualized.
Impression

 G5 P2.  Patient is here for fetal anatomy scan.  Short interval
 between pregnancies her previous pregnancy was
 complicated by fetal growth restriction.  She had two vaginal
 deliveries.

 On cell-free fetal DNA screening, the risks of fetal
 aneuploidies are not increased .MSAFP screening showed
 low risk for open-neural tube defects .

 We performed fetal anatomy scan. No makers of
 aneuploidies or fetal structural defects are seen. Fetal
 biometry is consistent with her previously-established dates.
 Amniotic fluid is normal and good fetal activity is seen.
 Patient understands the limitations of ultrasound in detecting
 fetal anomalies.
Recommendations

 -An appointment was made for her to return in 5 weeks for
 fetal growth assessment (and reevaluate 4-chamber view)
 -Fetal growth assessments every 4 weeks.
                 Adolph, Hoe

## 2022-05-04 ENCOUNTER — Other Ambulatory Visit: Payer: Self-pay

## 2022-05-04 ENCOUNTER — Encounter (HOSPITAL_COMMUNITY): Payer: Self-pay | Admitting: Emergency Medicine

## 2022-05-04 ENCOUNTER — Ambulatory Visit (HOSPITAL_COMMUNITY)
Admission: EM | Admit: 2022-05-04 | Discharge: 2022-05-04 | Disposition: A | Discharge status: Home / Self Care | Payer: Medicaid Other | Attending: Internal Medicine | Admitting: Internal Medicine

## 2022-05-04 DIAGNOSIS — F411 Generalized anxiety disorder: Secondary | ICD-10-CM

## 2022-05-04 DIAGNOSIS — R0602 Shortness of breath: Secondary | ICD-10-CM | POA: Diagnosis not present

## 2022-05-04 MED ORDER — CETIRIZINE HCL 10 MG PO TABS: 10.0000 mg | ORAL_TABLET | Freq: Every day | ORAL | 0 refills | Status: DC

## 2022-05-04 MED ORDER — BUSPIRONE HCL 5 MG PO TABS
5.0000 mg | ORAL_TABLET | Freq: Two times a day (BID) | ORAL | 0 refills | Status: AC
Start: 2022-05-04 — End: 2022-06-03

## 2022-05-04 NOTE — ED Provider Notes (Signed)
[facility]    CSN: [id number] Arrival date & time: 05/04/22  1331      History   Chief Complaint Chief Complaint  Patient presents with   Shortness of Breath    HPI Cindy [name] is a 26 y.o. female.   Patient presents urgent care for evaluation of shortness of breath that started today while she was at work.  When shortness of breath began, patient began to feel heart palpitations as well without chest pain, nausea, vomiting, dizziness, abdominal pain, left arm pain, or jaw pain.  Reports she has been increasingly anxious lately due to life stressors.  She was resting when her shortness of breath and heart palpitations began and denies recent increase or change in physical activity.  She does not have a history of thyroid problems, denies recent diarrhea or dehydration, urinary symptoms, and fever or chills.  She does not currently take any medications for anxiety or depression, although she states she experiences heart palpitations nearly every day and anxiety symptoms every day.  Denies SI/HI.  She has never been formally diagnosed with asthma but states she has been using an over-the-counter "asthma pump" to help with shortness of breath without relief.  She attempted to go to [employer] today to purchase 1 of these and they were out of stock.  She has not had a recent cough, sore throat, ear pain, or any other viral URI symptoms.  No known sick contacts.  Denies orthopnea and leg swelling.  No recent long periods of travel or sitting down without movement.  Denies smoking or drug use.  She reports some nasal congestion that has been present for the last few days.  Has not attempted any over-the-counter medications prior to arrival urgent care for symptoms.   Shortness of Breath   Past Medical History:  Diagnosis Date   Asthma    Heart murmur    Infection    UTI   Kidney stone    Kidney stones    Preeclampsia     Patient Active Problem List   Diagnosis Date  Noted   Preterm delivery 11/22/2020   Vaginal delivery 11/20/2020   History of preterm premature rupture of membranes (PPROM) 11/18/2020   Supervision of high risk pregnancy, antepartum 06/26/2020   History of pre-eclampsia in prior pregnancy, currently pregnant 06/26/2020   Short interval between pregnancies affecting pregnancy, antepartum 06/26/2020   History of prior pregnancy with IUGR newborn 06/26/2020   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 Authorizing Provider  busPIRone (BUSPAR) 5 MG tablet Take 1 tablet (5 mg total) by mouth 2 (two) times daily. 05/04/22 06/03/22 Yes Carlisle Beers, FNP  albuterol (VENTOLIN  HFA) 108 (90 Base) MCG/ACT inhaler Inhale 1 puff into the lungs every 6 (six) hours as needed for wheezing or shortness of breath.    [provider]  amoxicillin-clavulanate (AUGMENTIN) 875-125 MG tablet Take 1 tablet by mouth every 12 (twelve) hours. 08/21/21   Leath-Warren, Sadie Haber, NP  cetirizine (ZYRTEC ALLERGY) 10 MG tablet Take 1 tablet (10 mg total) by mouth daily. 05/04/22 06/03/22  Carlisle Beers, FNP  fluticasone (FLONASE) 50 MCG/ACT nasal spray Place 2 sprays into both nostrils daily for 14 days. 08/21/21 09/04/21  Leath-Warren, Sadie Haber, NP    Family History Family History  Problem Relation Age of Onset   Healthy Mother    Healthy Father     Social History Social History   Tobacco Use   Smoking status: Never    Smokeless tobacco: Never  Vaping Use   Vaping Use: Never used  Substance Use Topics   Alcohol use: No   Drug use: Not Currently    Types: Marijuana    Comment: 08/2020     Allergies   Acetaminophen   Review of Systems Review of Systems  Respiratory:  Positive for shortness of breath.   Per HPI   Physical Exam Triage Vital Signs ED Triage Vitals  Enc Vitals Group     BP 05/04/22 1427 118/83     Pulse Rate 05/04/22 1427 78     Resp 05/04/22 1427 17     Temp 05/04/22 1427 98.5 F (36.9 C)     Temp Source 05/04/22 1427 Oral     SpO2 05/04/22 1427 99 %     Weight --      Height --      Head Circumference --      Peak Flow --      Pain Score 05/04/22 1428 0     Pain Loc --      Pain Edu? --      Excl. in GC? --    No data found.  Updated Vital Signs BP 118/83 (BP Location: Left Arm)   Pulse 78   Temp 98.5 F (36.9 C) (Oral)   Resp 17   SpO2 99%   Visual Acuity Right Eye Distance:   Left Eye Distance:   Bilateral Distance:    Right Eye Near:   Left Eye Near:    Bilateral Near:     Physical Exam Vitals and nursing note reviewed.  Constitutional:      Appearance: She is not ill-appearing or toxic-appearing.  HENT:     Head: Normocephalic and atraumatic.     Right Ear: Hearing, tympanic membrane, ear canal and external ear normal.     Left Ear: Hearing, tympanic membrane, ear canal and external ear normal.     Nose: Rhinorrhea present.     Mouth/Throat:     Lips: Pink.     Mouth: Mucous membranes are moist.     Pharynx: No posterior oropharyngeal erythema.  Eyes:     General: Lids are normal. Vision grossly intact. Gaze aligned appropriately.     Extraocular Movements: Extraocular movements intact.     Conjunctiva/sclera: Conjunctivae normal.     Pupils: Pupils are equal, round, and reactive to light.  Cardiovascular:     Rate and Rhythm: Normal rate and regular rhythm.     Heart sounds: Normal heart sounds, S1 normal and S2 normal.  Pulmonary:      Effort: Pulmonary effort is normal. No respiratory distress.     Breath sounds:  Normal breath sounds and air entry.  Musculoskeletal:     Cervical back: Neck supple.  Skin:    General: Skin is warm and dry.     Capillary Refill: Capillary refill takes less than 2 seconds.     Findings: No rash.  Neurological:     General: No focal deficit present.     Mental Status: She is alert and oriented to person, place, and time. Mental status is at baseline.     Cranial Nerves: No dysarthria or facial asymmetry.  Psychiatric:        Attention and Perception: Attention normal.        Mood and Affect: Mood is anxious.        Speech: Speech normal.        Behavior: Behavior normal.        Thought Content: Thought content normal.        Judgment: Judgment normal.      UC Treatments / Results  Labs (all labs ordered are listed, but only abnormal results are displayed) Labs Reviewed - No data to display  EKG   Radiology No results found.  Procedures Procedures (including critical care time)  Medications Ordered in UC Medications - No data to display  Initial Impression / Assessment and Plan / UC Course  I have reviewed the triage vital signs and the nursing notes.  Pertinent labs & imaging results that were available during my care of the patient were reviewed by me and considered in my medical decision making (see chart for details).   1.  Anxiety state and shortness of breath EKG shows normal sinus rhythm with sinus arrhythmia with a ventricular rate of 69 bpm.  There are no ST changes or red flag signs indicating need for immediate referral to the emergency department for further work-up and evaluation.  Patient does appear to be anxious in the clinic.  I believe she would benefit from taking BuSpar twice daily to help with anxiety and therefore help with shortness of breath.  [facility] provided in clinic and PCP assistance initiated  to help patient establish care with a primary care provider who can further manage her mental health medication needs.  Continues to deny SI/HI.  She may take Zyrtec every day to help with nasal congestion/drainage as I believe that she is also suffering from some seasonal allergy symptoms.  Advised to return to urgent care if symptoms fail to improve.  Strict ER return precautions discussed.  Patient expresses understanding and agreement with plan.   Discussed physical exam and available lab work findings in clinic with patient.  Counseled patient regarding appropriate use of medications and potential side effects for all medications recommended or prescribed today. Discussed red flag signs and symptoms of worsening condition,when to call the PCP office, return to urgent care, and when to seek higher level of care in the emergency department. Patient verbalizes understanding and agreement with plan. All questions answered. Patient discharged in stable condition.    Final Clinical Impressions(s) / UC Diagnoses   Final diagnoses:  Anxiety state  Shortness of breath     Discharge Instructions      Take BuSpar twice daily as needed for anxiety.  Take Zyrtec once daily to help with nasal congestion and drainage.  To find a primary care provider go to [email or web address] and follow the prompts to schedule a new patient appointment for primary care.  Someone from [facility] will be reaching out to  you either via MyChart or phone call to help you set up a primary care provider appointment as well.  It is very important to have a primary care provider to manage your overall health and prevent/manage chronic medical conditions.   You may go to the [facility] behavioral health center for further evaluation of your anxiety symptoms.  I would like for you to establish care with a primary care provider to help further manage this in the future.  Your EKG looks great today (heart  test).   If you develop any new or worsening symptoms or do not improve in the next 2 to 3 days, please return.  If your symptoms are severe, please go to the emergency room.  Follow-up with your primary care provider for further evaluation and management of your symptoms as well as ongoing wellness visits.  I hope you feel better!   ED Prescriptions     Medication Sig Dispense Auth. Provider   busPIRone (BUSPAR) 5 MG tablet Take 1 tablet (5 mg total) by mouth 2 (two) times daily. 60 tablet Carlisle Beers, FNP   cetirizine (ZYRTEC ALLERGY) 10 MG tablet Take 1 tablet (10 mg total) by mouth daily. 30 tablet Carlisle Beers, FNP      PDMP not reviewed this encounter.   Carlisle Beers, [place] 05/04/22 [phone]

## 2022-05-04 NOTE — ED Triage Notes (Signed)
Pt reports SOB beginning today. States she sometimes has "spells" but hasn't had any lately. Reports normally getting an OTC asthma pump from [employer] that normally helps but [employer] was out of stock.

## 2022-05-04 NOTE — Discharge Instructions (Signed)
Take BuSpar twice daily as needed for anxiety.  Take Zyrtec once daily to help with nasal congestion and drainage.  To find a primary care provider go to [email or web address] and follow the prompts to schedule a new patient appointment for primary care.  Someone from [facility] health will be reaching out to you either via MyChart or phone call to help you set up a primary care provider appointment as well.  It is very important to have a primary care provider to manage your overall health and prevent/manage chronic medical conditions.   You may go to the [facility] behavioral health center for further evaluation of your anxiety symptoms.  I would like for you to establish care with a primary care provider to help further manage this in the future.  Your EKG looks great today (heart test).   If you develop any new or worsening symptoms or do not improve in the next 2 to 3 days, please return.  If your symptoms are severe, please go to the emergency room.  Follow-up with your primary care provider for further evaluation and management of your symptoms as well as ongoing wellness visits.  I hope you feel better!

## 2022-05-28 IMAGING — US US MFM OB FOLLOW-UP
1 series · 12 of 28 positions shown · non-contrast
Comparison: none

[Series 1: us mfm ob follow-up · 111 acquisitions, 12 frames shown]
[im 5/111]
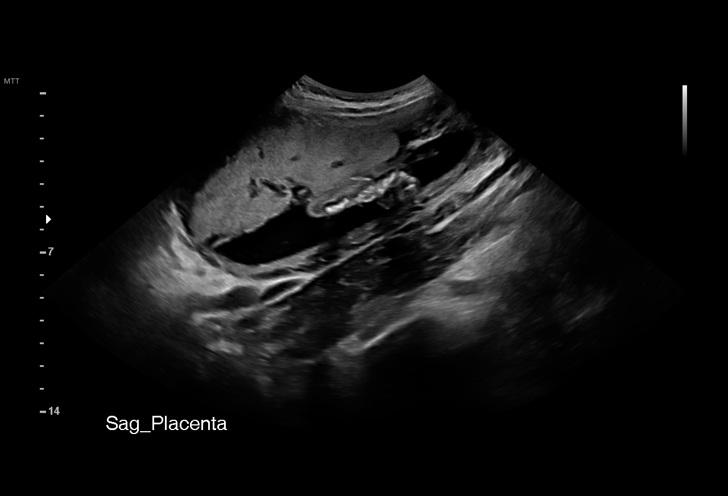
[im 13/111]
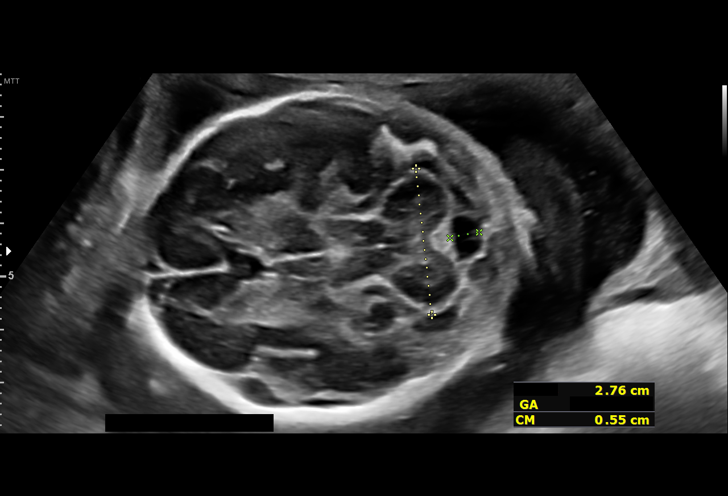
[im 21/111]
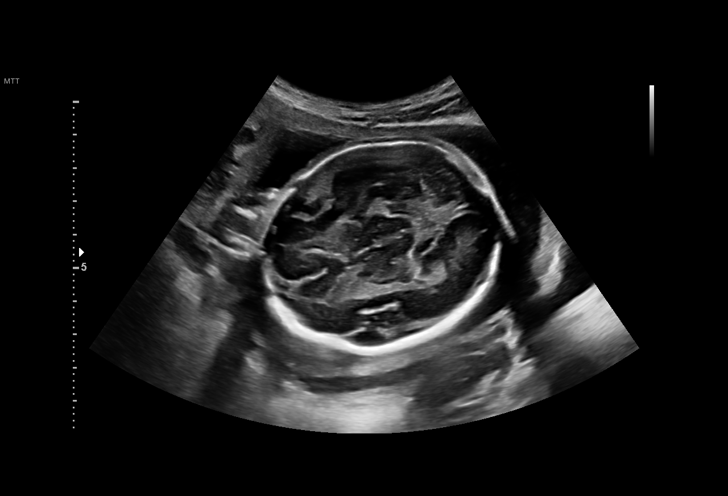
[im 33/111]
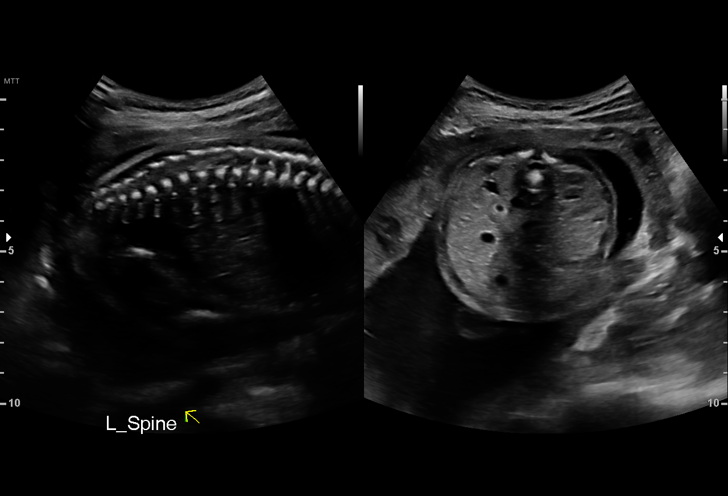
[im 41/111]
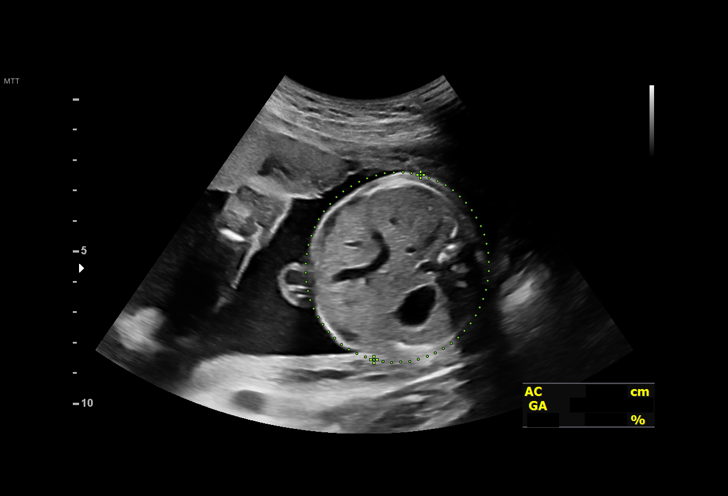
[im 49/111]
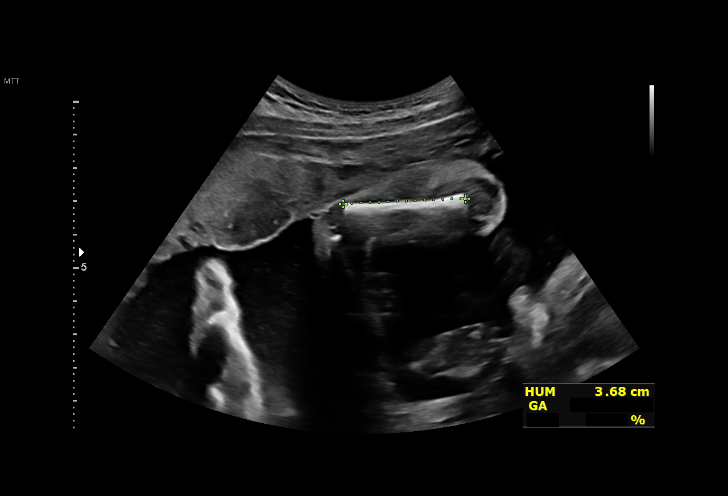
[im 62/111]
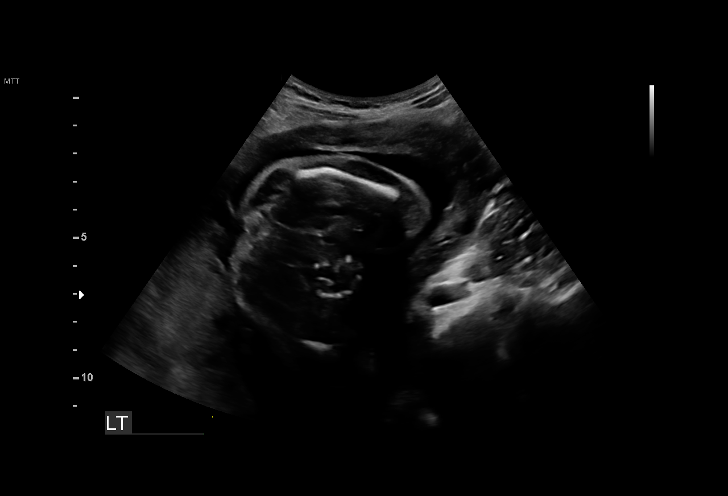
[im 70/111]
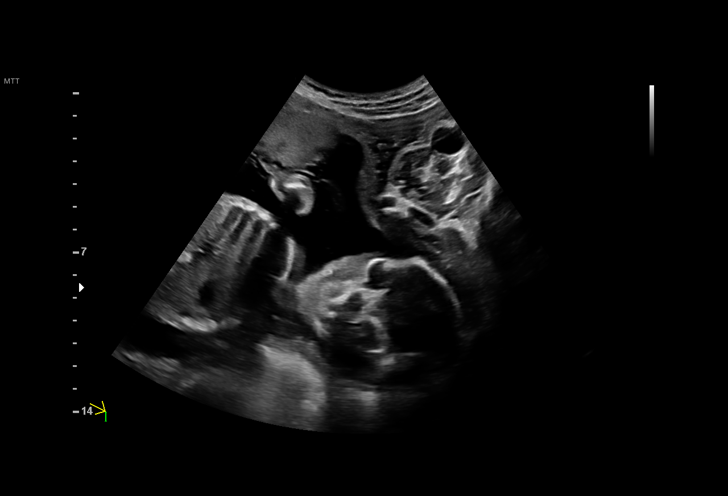
[im 78/111]
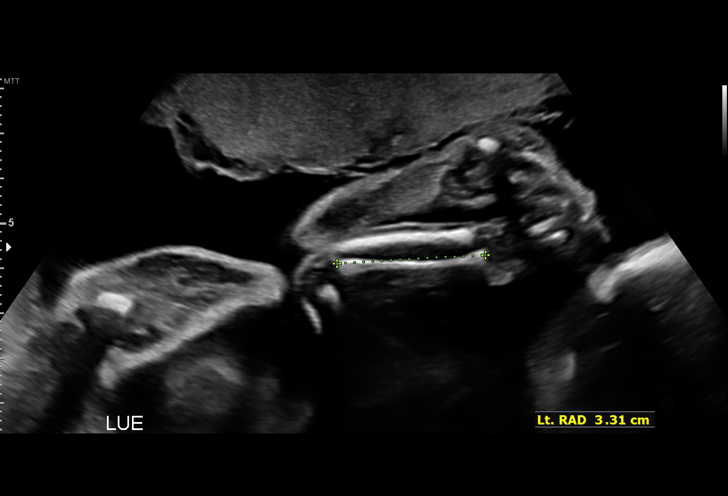
[im 90/111]
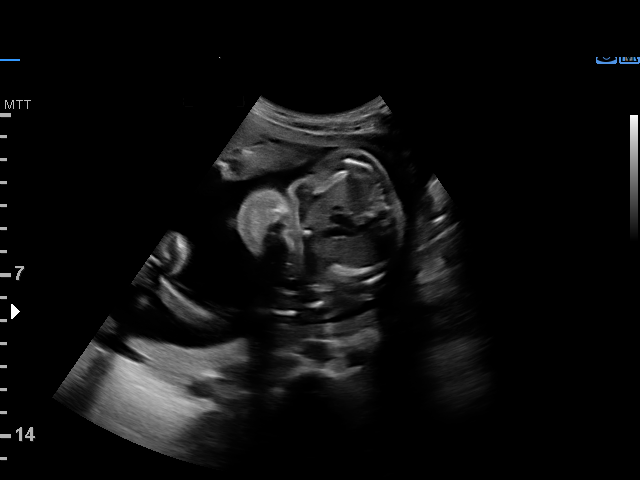
[im 98/111]
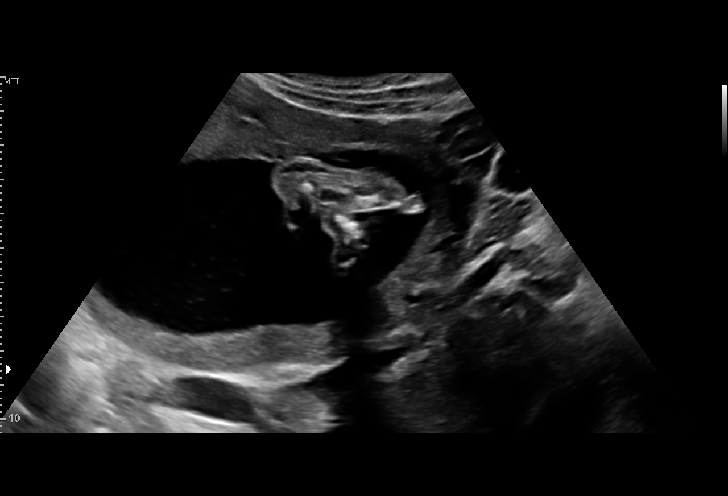
[im 106/111]
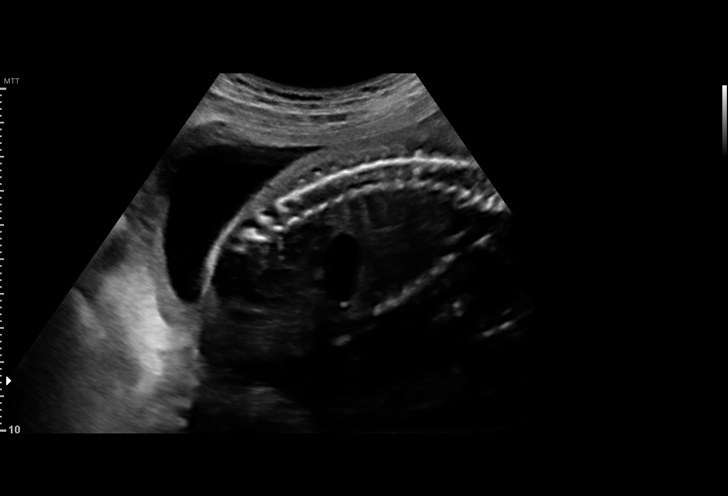

[12 of 28 positions shown; findings below may reference images not displayed]

Indications

 24 weeks gestation of pregnancy
 Encounter for antenatal screening for
 malformations (LR NIPS, Neg AFP)
 Poor obstetric history: Previous
 preeclampsia / eclampsia/gestational HTN
 Short interval between pregancies, 2nd
 trimester
 Anemia during pregnancy in second trimester
 Poor obstetric history: Previous fetal growth
 restriction (FGR)
Fetal Evaluation

 Num Of Fetuses:         1
 Fetal Heart Rate(bpm):  132
 Cardiac Activity:       Observed
 Presentation:           Cephalic
 Placenta:               Anterior
 P. Cord Insertion:      Previously Visualized

 Amniotic Fluid
 AFI FV:      Within normal limits

                             Largest Pocket(cm)

Biometry

 BPD:      59.7  mm     G. Age:  24w 3d         57  %    CI:         77.8   %    70 - 86
                                                         FL/HC:      17.3   %    18.7 -
 HC:      214.2  mm     G. Age:  23w 3d         15  %    HC/AC:      1.12        1.05 -
 AC:      190.9  mm     G. Age:  23w 6d         35  %    FL/BPD:     62.0   %    71 - 87
 FL:         37  mm     G. Age:  21w 5d        1.2  %    FL/AC:      19.4   %    20 - 24
 HUM:      36.7  mm     G. Age:  22w 6d         13  %
 CER:      27.6  mm     G. Age:  24w 4d         77  %

 LV:        4.8  mm
 CM:        5.5  mm
  RIGHT
 FL:       37.2  mm     G. Age:  21w 6d        1.4  %
 ULN:      35.3  mm     G. Age:  23w 5d         31  %
 TIB:      34.1  mm     G. Age:  22w 5d         17  %
 RAD:      31.1  mm     G. Age:  22w 1d         31  %
 FIB:      33.9  mm     G. Age:  22w 3d         45  %
 Foot:       41  mm     G. Age:  22w 5d         10  %
  LEFT
 FL:       33.9  mm     G. Age:  20w 5d        < 1  %
 ULN:      37.9  mm     G. Age:  25w 0d         58  %
 TIB:      35.7  mm     G. Age:  23w 3d         35  %
 RAD:      33.1  mm     G. Age:  23w 4d         44  %
 FIB:      35.8  mm     G. Age:  23w 3d         56  %
 Foot:     44.6  mm     G. Age:  24w 1d         46  %

 Est. FW:     558  gm      1 lb 4 oz      9  %
OB History

 Gravidity:    5
 Living:       2
Gestational Age

 LMP:           24w 0d        Date:  04/07/20                 EDD:   01/12/21
 U/S Today:     23w 3d                                        EDD:   01/16/21
 Best:          24w 0d     Det. By:  LMP  (04/07/20)          EDD:   01/12/21
Anatomy

 Cranium:               Appears normal         LVOT:                   Appears normal
 Cavum:                 Appears normal         Aortic Arch:            Appears normal
 Ventricles:            Appears normal         Ductal Arch:            Previously seen
 Choroid Plexus:        Appears normal         Diaphragm:              Appears normal
 Cerebellum:            Appears normal         Stomach:                Appears normal, left
                                                                       sided
 Posterior Fossa:       Appears normal         Abdomen:                Appears normal
 Nuchal Fold:           Previously seen        Abdominal Wall:         Appears nml (cord
                                                                       insert, abd wall)
 Face:                  Orbits and profile     Cord Vessels:           Appears normal (3
                        previously seen                                vessel cord)
 Lips:                  Previously seen        Kidneys:                Appear normal
 Thoracic:              Appears normal         Bladder:                Appears normal
 Heart:                 Appears normal         Spine:                  Appears normal
                        (4CH, axis, and
                        situs)
 RVOT:                  Appears normal         Upper Extremities:      Appears normal
 Other:  Fetus appears to be a male. Technically difficult due to fetal position.
Doppler - Fetal Vessels

 Umbilical Artery
  S/D     %tile      RI    %tile      PI    %tile     PSV    ADFV    RDFV
                                                    (cm/s)
  3.37       44     0.7       45    0.[REDACTED]      No      No

Cervix Uterus Adnexa

 Cervix
 Length:           3.39  cm.
 Normal appearance by transabdominal scan.

 Uterus
 No abnormality visualized.

 Right Ovary
 Within normal limits.

 Left Ovary
 Within normal limits.

 Cul De Sac
 No free fluid seen.

 Adnexa
 No abnormality visualized.
Impression

 Ms. Aristizabal is G5P2 at 24 w 0 d. She is her for a follow up
 growth. She had prior pregnancies complicated by fetal
 growth restriction.
 Today we observed fetal growth restriction with an EFW 9th%
 and also shortened and isolated bowed femurs bilaterally.
 There was normal amniotic fluid, normal cranium, no
 fractures, chest was normal and not small, there were no
 fractures noted. The femurs appear slightly hypocalcified.

 The femur to foot ratio is 0.9 which is < 1 which is more
 indicative of a skeletal dysplasia.
 She has a known LR NIPS

 There was good fetal movement and normal UA dopplers,
 without evidence of AEDF or REDF.

 NST was preformed and demonstrated moderate variablity
 but no accelarations. There were two variable decelerations
 and on deceleration that appeared late. However, given the
 gestational age and no coinciding uterine contraction, it is not
 fully certain.

 I discussed with Ms. Georgiana study and findings. I reviewed
 the diagnosis, evaluation and management of shortenend
 femurs especially those supsicious for skeletal dysplasia.
 I explained that today's finding may be isolated vs apart of a
 syndrome. I explained that no additional markers for skeletal
 dysplasia was obsereved other than that was previously
 mentioned. I explained that skeletal dysplasia is a term that
 represent a host of diagnosis from more mild to severe and
 even lethal diagnosis. I explained that this did not appear
 lethal in nature. We discussed the recommendations for
 additional testing, including non -invasive but limited testing
 focused on the most common causes of skeletal dysplasias
 to invasive testing with possible whole exome sequencing.
 We discussed the risk to diagnostic testing being [DATE]-
 [DATE] risk for perinatal loss due to vaginal bleeding,
 premature rupture of membranes to infection. In addition, we
 offered genetic counseling. At this she declined further
 testing as it would not change her care for the pregnancy.
 She did accept meeting with our genetic counselor to discuss
 non-invasive testing.

 I explained as the pregnancy continues more features may
 present giving more clarity around the diagnosis.

 Lastly, given her NST today we asked her to go to [facility] for
 further monitoring.
Recommendations

 She will return in 1 week for NST/UA dopplers and genetic
 counseling
 Repeat growth in 3 weeks.

## 2022-07-01 ENCOUNTER — Encounter (HOSPITAL_COMMUNITY): Payer: Self-pay

## 2022-07-01 ENCOUNTER — Other Ambulatory Visit: Payer: Self-pay

## 2022-07-01 ENCOUNTER — Inpatient Hospital Stay (HOSPITAL_COMMUNITY)
Admission: AD | Admit: 2022-07-01 | Discharge: 2022-07-01 | Discharge status: Against Medical Advice | Payer: Medicaid Other | Attending: Family Medicine | Admitting: Family Medicine

## 2022-07-01 DIAGNOSIS — Z5329 Procedure and treatment not carried out because of patient's decision for other reasons: Secondary | ICD-10-CM | POA: Diagnosis not present

## 2022-07-01 LAB — POCT PREGNANCY, URINE: Preg Test, Ur: POSITIVE — AB

## 2022-07-01 NOTE — MAU Note (Signed)
Cindy [name] is a 27 y.o. at [redacted]w[redacted]d here in MAU reporting: cramping and spotting for the past 2 days. States it is intermittent. No bleeding since yesterday.  LMP: 05/19/22  Onset of complaint: ongoing  Pain score: 0/10  Vitals:   07/01/22 1803  BP: 130/68  Pulse: 82  Resp: 16  Temp: 99.1 F (37.3 C)  SpO2: 99%     FHT:NA  Lab orders placed from triage: none

## 2022-07-01 NOTE — MAU Note (Signed)
Per registration pt states she was leaving and they watched her walk out. [facility] CNM informed.

## 2022-09-03 DIAGNOSIS — Z114 Encounter for screening for human immunodeficiency virus [HIV]: Secondary | ICD-10-CM | POA: Diagnosis not present

## 2022-09-03 DIAGNOSIS — Z113 Encounter for screening for infections with a predominantly sexual mode of transmission: Secondary | ICD-10-CM | POA: Diagnosis not present

## 2022-10-04 DIAGNOSIS — Z30013 Encounter for initial prescription of injectable contraceptive: Secondary | ICD-10-CM | POA: Diagnosis not present

## 2022-10-04 DIAGNOSIS — Z7251 High risk heterosexual behavior: Secondary | ICD-10-CM | POA: Diagnosis not present

## 2022-10-04 DIAGNOSIS — Z113 Encounter for screening for infections with a predominantly sexual mode of transmission: Secondary | ICD-10-CM | POA: Diagnosis not present

## 2022-10-04 DIAGNOSIS — Z3202 Encounter for pregnancy test, result negative: Secondary | ICD-10-CM | POA: Diagnosis not present

## 2022-10-14 DIAGNOSIS — Z114 Encounter for screening for human immunodeficiency virus [HIV]: Secondary | ICD-10-CM | POA: Diagnosis not present

## 2022-10-14 DIAGNOSIS — Z113 Encounter for screening for infections with a predominantly sexual mode of transmission: Secondary | ICD-10-CM | POA: Diagnosis not present

## 2022-10-14 DIAGNOSIS — B3731 Acute candidiasis of vulva and vagina: Secondary | ICD-10-CM | POA: Diagnosis not present

## 2022-12-22 ENCOUNTER — Encounter (HOSPITAL_COMMUNITY): Payer: Self-pay

## 2022-12-22 ENCOUNTER — Ambulatory Visit (HOSPITAL_COMMUNITY)
Admission: EM | Admit: 2022-12-22 | Discharge: 2022-12-22 | Disposition: A | Discharge status: Home / Self Care | Payer: Medicaid Other | Attending: Physician Assistant | Admitting: Physician Assistant

## 2022-12-22 DIAGNOSIS — S61011A Laceration without foreign body of right thumb without damage to nail, initial encounter: Secondary | ICD-10-CM | POA: Diagnosis not present

## 2022-12-22 DIAGNOSIS — Z23 Encounter for immunization: Secondary | ICD-10-CM

## 2022-12-22 MED ORDER — TETANUS-DIPHTH-ACELL PERTUSSIS 5-2.5-18.5 LF-MCG/0.5 IM SUSY
0.5000 mL | PREFILLED_SYRINGE | Freq: Once | INTRAMUSCULAR | Status: AC
Start: 2022-12-22 — End: 2022-12-22
  Administered 2022-12-22: 0.5 mL via INTRAMUSCULAR

## 2022-12-22 MED ORDER — TETANUS-DIPHTH-ACELL PERTUSSIS 5-2.5-18.5 LF-MCG/0.5 IM SUSY
PREFILLED_SYRINGE | INTRAMUSCULAR | Status: AC
  Filled 2022-12-22: qty 0.5

## 2022-12-22 MED ORDER — LIDOCAINE HCL (PF) 1 % IJ SOLN
INTRAMUSCULAR | Status: AC
  Filled 2022-12-22: qty 30

## 2022-12-22 NOTE — Discharge Instructions (Addendum)
They have placed a 6 sutures.  Please return in 10 to 14 days to have these removed.  Keep this area clean with soap and water.  Try to avoid prolonged water exposure until the sutures are removed.  If you have any signs of infection including redness, swelling, pain, drainage you need to be seen immediately.  Your tetanus was updated today.

## 2022-12-22 NOTE — ED Provider Notes (Addendum)
[facility]    CSN: [id number] Arrival date & time: 12/22/22  1409      History   Chief Complaint No chief complaint on file.   HPI Cindy [name] is a 27 y.o. female.   Patient presents today 18-hour history of laceration to her right thumb/hand.  She was working in the kitchen when she cut herself on a broken mug last night.  She does not have any concern for retained foreign body.  She is right-handed.  Denies any numbness or paresthesias.  She is unsure when her last tetanus was.  She did not clean it with anything specific and instead was presented to our clinic.  She is confident that she is not pregnant.    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   TINEA VERSICOLOR 08/21/2010   ACNE VULGARIS, 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   DYSPNEA ON EXERTION 08/21/2010   ANEMIA 03/31/2007   MURMUR 03/31/2007   CHEST WALL PAIN, HX OF 03/31/2007    Past Surgical History:  Procedure Laterality Date   NO PAST SURGERIES      OB History     Gravida  6   Para  3   Term  1   Preterm  2   AB  2   Living  3      SAB  2   IAB  0   Ectopic  0   Multiple  0   Live Births  3            Home Medications    Prior to Admission medications   Not on File    Family History Family History  Problem Relation Age of Onset   Healthy Mother    Healthy Father     Social  History Social History   Tobacco Use   Smoking status: Every Day    Packs/day: .15    Types: Cigarettes   Smokeless tobacco: Never  Vaping Use   Vaping Use: Never used  Substance Use Topics   Alcohol use: No   Drug use: Yes    Types: Marijuana    Comment: 08/2020     Allergies   Acetaminophen   Review of Systems Review of Systems  Constitutional:  Positive for activity change. Negative for appetite change, fatigue and fever.  Musculoskeletal:  Negative for arthralgias and myalgias.  Skin:  Positive for wound. Negative for color change.  Neurological:  Negative for weakness and numbness.     Physical Exam Triage Vital Signs ED Triage Vitals  Enc Vitals Group     BP 12/22/22 1548 120/80     Pulse Rate 12/22/22 1548 90     Resp 12/22/22 1548 16     Temp 12/22/22 1548 99.1 F (37.3 C)     Temp Source 12/22/22 1548 Oral     SpO2 12/22/22 1548 99 %  Weight --      Height --      Head Circumference --      Peak Flow --      Pain Score 12/22/22 1550 8     Pain Loc --      Pain Edu? --      Excl. in GC? --    No data found.  Updated Vital Signs BP 120/80 (BP Location: Left Arm)   Pulse 90   Temp 99.1 F (37.3 C) (Oral)   Resp 16   LMP 12/16/2022 (Approximate)   SpO2 99%   Breastfeeding Unknown   Visual Acuity Right Eye Distance:   Left Eye Distance:   Bilateral Distance:    Right Eye Near:   Left Eye Near:    Bilateral Near:     Physical Exam Vitals reviewed.  Constitutional:      General: She is awake. She is not in acute distress.    Appearance: Normal appearance. She is well-developed. She is not ill-appearing.     Comments: Very pleasant female appears stated age in no acute distress sitting comfortably in exam room  HENT:     Head: Normocephalic and atraumatic.  Pulmonary:     Effort: Pulmonary effort is normal. No tachypnea, accessory muscle usage or respiratory distress.  Musculoskeletal:     Right hand: Laceration present. No  tenderness. Normal range of motion. Normal sensation. There is no disruption of two-point discrimination.     Comments: Right hand: 4 cm laceration noted right first MTP joint.  Normal active range of motion of the thumb.  Thumb is neurovascularly intact.  Psychiatric:        Behavior: Behavior is cooperative.      UC Treatments / Results  Labs (all labs ordered are listed, but only abnormal results are displayed) Labs Reviewed - No data to display  EKG   Radiology No results found.  Procedures Laceration Repair  Date/Time: 12/22/2022 4:41 PM  Performed by: Jeani Hawking, PA-C Authorized by: Jeani Hawking, PA-C   Consent:    Consent obtained:  Verbal   Consent given by:  Patient   Risks discussed:  Infection, poor cosmetic result and poor wound healing   Alternatives discussed:  Observation and delayed treatment Universal protocol:    Procedure explained and questions answered to patient or proxy's satisfaction: yes     Patient identity confirmed:  Verbally with patient Anesthesia:    Anesthesia method:  Local infiltration   Local anesthetic:  Lidocaine 1% w/o epi Laceration details:    Location:  Finger   Finger location:  R thumb   Length (cm):  4 Pre-procedure details:    Preparation:  Patient was prepped and draped in usual sterile fashion Exploration:    Limited defect created (wound extended): no     Hemostasis achieved with:  Direct pressure   Imaging outcome: foreign body not noted     Wound exploration: entire depth of wound visualized     Contaminated: no   Treatment:    Area cleansed with:  Chlorhexidine   Amount of cleaning:  Standard   Irrigation solution:  Tap water   Irrigation method:  Syringe   Visualized foreign bodies/material removed: no     Debridement:  None Skin repair:    Repair method:  Sutures   Suture size:  6-0   Suture material:  Prolene   Suture technique:  Simple interrupted   Number of sutures:  6 Approximation:  Approximation:  Close Repair type:    Repair type:  Simple Post-procedure details:    Dressing:  Non-adherent dressing   Procedure completion:  Tolerated  (including critical care time)  Medications Ordered in UC Medications  Tdap (BOOSTRIX) injection 0.5 mL (0.5 mLs Intramuscular Given 12/22/22 1636)    Initial Impression / Assessment and Plan / UC Course  I have reviewed the triage vital signs and the nursing notes.  Pertinent labs & imaging results that were available during my care of the patient were reviewed by me and considered in my medical decision making (see chart for details).     Laceration was cleaned and repaired in clinic today.  See procedure note above.  Tetanus was updated.  Discussed wound care procedure.  Patient is to return in 10 to 14 days to have sutures removed.  Recommend that she avoid strenuous activity including regular use of the thumb given this increases the risk of dehiscence based on location.  Discussed that if she has any dehiscence or signs of infection including swelling, redness, pain, drainage she needs to be seen immediately.  Strict return precautions given.  Final Clinical Impressions(s) / UC Diagnoses   Final diagnoses:  Laceration of right thumb without foreign body without damage to nail, initial encounter     Discharge Instructions      They have placed a 6 sutures.  Please return in 10 to 14 days to have these removed.  Keep this area clean with soap and water.  Try to avoid prolonged water exposure until the sutures are removed.  If you have any signs of infection including redness, swelling, pain, drainage you need to be seen immediately.  Your tetanus was updated today.     ED Prescriptions   None    PDMP not reviewed this encounter.   Jeani Hawking, PA-C 12/22/22 1640    RaspetNoberto Retort, PA-C 12/22/22 1642

## 2022-12-22 NOTE — ED Triage Notes (Signed)
Patient states she cut her right thumb on something in the kitchen approx 1900 yesterday. Patient had a band aid over th laceration. No bleeding at this time. Patient does not know when she had a tetanus shot last.

## 2023-01-12 ENCOUNTER — Encounter (HOSPITAL_COMMUNITY): Payer: Self-pay | Admitting: Emergency Medicine

## 2023-01-12 ENCOUNTER — Ambulatory Visit (HOSPITAL_COMMUNITY)
Admission: EM | Admit: 2023-01-12 | Discharge: 2023-01-12 | Disposition: A | Discharge status: Home / Self Care | Payer: Medicaid Other

## 2023-01-12 DIAGNOSIS — Z4802 Encounter for removal of sutures: Secondary | ICD-10-CM | POA: Diagnosis not present

## 2023-01-12 NOTE — ED Triage Notes (Signed)
Pt had sutures in right thumb placed here on 6/30. Denies any s/s of infection. Reports some pain at times.

## 2023-02-25 DIAGNOSIS — Z30013 Encounter for initial prescription of injectable contraceptive: Secondary | ICD-10-CM | POA: Diagnosis not present

## 2023-02-25 DIAGNOSIS — Z3202 Encounter for pregnancy test, result negative: Secondary | ICD-10-CM | POA: Diagnosis not present

## 2023-03-11 ENCOUNTER — Ambulatory Visit (HOSPITAL_COMMUNITY)
Admission: EM | Admit: 2023-03-11 | Discharge: 2023-03-11 | Disposition: A | Discharge status: Home / Self Care | Payer: Medicaid Other | Attending: Physician Assistant | Admitting: Physician Assistant

## 2023-03-11 ENCOUNTER — Other Ambulatory Visit: Payer: Self-pay

## 2023-03-11 ENCOUNTER — Encounter (HOSPITAL_COMMUNITY): Payer: Self-pay | Admitting: Emergency Medicine

## 2023-03-11 DIAGNOSIS — K0889 Other specified disorders of teeth and supporting structures: Secondary | ICD-10-CM | POA: Diagnosis not present

## 2023-03-11 DIAGNOSIS — K047 Periapical abscess without sinus: Secondary | ICD-10-CM | POA: Diagnosis not present

## 2023-03-11 MED ORDER — LIDOCAINE VISCOUS HCL 2 % MT SOLN: 15.0000 mL | Freq: Four times a day (QID) | OROMUCOSAL | 0 refills | Status: DC | PRN

## 2023-03-11 MED ORDER — AMOXICILLIN-POT CLAVULANATE 875-125 MG PO TABS: 1.0000 | ORAL_TABLET | Freq: Two times a day (BID) | ORAL | 0 refills | Status: DC

## 2023-03-11 MED ORDER — IBUPROFEN 800 MG PO TABS: 800.0000 mg | ORAL_TABLET | Freq: Three times a day (TID) | ORAL | 0 refills | Status: DC

## 2023-03-11 NOTE — ED Triage Notes (Signed)
Left side dental pain for 3 days.

## 2023-03-11 NOTE — ED Provider Notes (Signed)
[facility]    CSN: [id number] Arrival date & time: 03/11/23  1412      History   Chief Complaint Chief Complaint  Patient presents with   Dental Pain    HPI Cindy [name] is a 27 y.o. female.   Patient presents today with a 3-day history of worsening left upper molar pain.  She reports pain is rated 9 on a 0-10 pain scale, described as throbbing, worse with mastication, no alleviating factors identified.  She has not seen a dentist recently denies any recent dental procedure.  She has tried ibuprofen with temporary improvement of symptoms.  She has not been eating and drinking as this exacerbates pain.  She denies any dysphagia, muffled voice, swelling of her throat, shortness of breath.  She is calm for that she is not pregnant.  Denies any recent antibiotics in the past 90 days; did have 1 dose of amoxicillin leftover from a previous prescription that she took yesterday without change in her symptoms.  She does not currently follow with a dentist.    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 Authorizing Provider  amoxicillin-clavulanate (AUGMENTIN) 875-125 MG tablet Take 1 tablet by mouth every 12 (twelve) hours. 03/11/23  Yes Lavern Maslow K, PA-C  ibuprofen (ADVIL) 800 MG tablet Take 1 tablet (800 mg total) by mouth 3 (three) times daily. 03/11/23  Yes Jacinta Penalver K, PA-C  lidocaine (XYLOCAINE) 2 % solution Use as directed 15 mLs in the mouth or throat every 6 (six) hours as needed for mouth pain. Swish and spit 03/11/23  Yes Ajai Terhaar, Noberto Retort, PA-C    Family History Family History  Problem Relation Age of Onset   Healthy Mother    Healthy Father     Social History Social History   Tobacco Use   Smoking status: Every Day    Current packs/day: 0.15    Types: Cigarettes   Smokeless tobacco: Never  Vaping Use   Vaping status: Never Used  Substance Use Topics  Alcohol use: No   Drug use: Yes    Types: Marijuana    Comment: 08/2020     Allergies   Acetaminophen   Review of Systems Review of Systems  Constitutional:  Positive for activity change. Negative for appetite change, fatigue and fever.  HENT:  Positive for dental problem. Negative for sore throat, trouble swallowing and voice change.   Respiratory:  Negative for shortness of breath.   Cardiovascular:  Negative for chest pain.  Gastrointestinal:  Negative for abdominal pain, diarrhea, nausea and vomiting.     Physical Exam Triage Vital Signs ED Triage Vitals [03/11/23 1453]  Encounter Vitals Group     BP 115/79     Systolic BP Percentile      Diastolic BP Percentile      Pulse Rate 80     Resp 18     Temp 98.6 F (37 C)     Temp Source Oral     SpO2 97 %     Weight      Height      Head Circumference      Peak Flow      Pain Score 7     Pain Loc       Pain Education      Exclude from Growth Chart    No data found.  Updated Vital Signs BP 115/79 (BP Location: Right Arm)   Pulse 80   Temp 98.6 F (37 C) (Oral)   Resp 18   LMP 05/26/2022   SpO2 97%   Visual Acuity Right Eye Distance:   Left Eye Distance:   Bilateral Distance:    Right Eye Near:   Left Eye Near:    Bilateral Near:     Physical Exam Vitals reviewed.  Constitutional:      General: She is awake. She is not in acute distress.    Appearance: Normal appearance. She is well-developed. She is not ill-appearing.     Comments: Very pleasant female appears stated age in no acute distress sitting comfortably in exam room  HENT:     Head: Normocephalic and atraumatic.     Right Ear: External ear normal.     Left Ear: External ear normal.     Mouth/Throat:     Dentition: Gingival swelling and dental abscesses present.     Pharynx: Uvula midline. No oropharyngeal exudate or posterior oropharyngeal erythema.      Comments: Tenderness with percussion and swelling surrounding left upper third molar (#16).  No evidence of Ludwig angina. Cardiovascular:     Rate and Rhythm: Normal rate and regular rhythm.     Heart sounds: Normal heart sounds, S1 normal and S2 normal. No murmur heard. Pulmonary:     Effort: Pulmonary effort is normal.     Breath sounds: Normal breath sounds. No wheezing, rhonchi or rales.     Comments: Clear to auscultation bilaterally Psychiatric:        Behavior: Behavior is cooperative.      UC Treatments / Results  Labs (all labs ordered are listed, but only abnormal results are displayed) Labs Reviewed - No data to display  EKG   Radiology No results found.  Procedures Procedures (including critical care time)  Medications Ordered in UC Medications - No data to display  Initial Impression / Assessment and Plan / UC Course  I have reviewed the triage vital signs and the nursing notes.  Pertinent labs & imaging results that were  available during my care  of the patient were reviewed by me and considered in my medical decision making (see chart for details).     Patient is well-appearing, afebrile, nontoxic, nontachycardic.  No indication for emergent evaluation or imaging.  Patient was treated for dental infection with Augmentin twice daily for 10 days.  She was given ibuprofen 800 mg for pain relief with instruction to take this with food to prevent GI upset.  She is to avoid use of additional NSAIDs.   Recommend she gargle with warm salt water for additional symptom relief.  She can use viscous lidocaine for additional symptom relief and recommend that she swish and spit this up to 4 times per day.  Discussed that she is not to eat or drink immediately after using this medication as it increases the risk of choking.  Discussed that ultimately she will need to see dentist to address underlying tooth.  She was provided low-cost dental resources in the area with after visit summary.  If she develops any worsening symptoms including fever, nausea, vomiting, swelling of her throat, muffled voice, dysphagia she needs to go to the emergency room to which she expressed understanding.  Work excuse note declined.   Final Clinical Impressions(s) / UC Diagnoses   Final diagnoses:  Dental infection  Pain, dental     Discharge Instructions      Start Augmentin twice daily for 10 days.  Use ibuprofen 800 mg up to 3 times a day for pain relief.  You should avoid NSAIDs including aspirin, ibuprofen/Advil, naproxen/Aleve with this medication as it causes stomach bleeding.  Gargle with warm salt water for additional symptom relief.  Use viscous lidocaine up to 4 times a day.  Do not eat or drink immediately after using this medication as it increases your risk of choking.  You should follow-up with dentist; call to schedule an appointment.  If you develop any worsening symptoms including difficulty swallowing, difficulty speaking, swelling  of your throat, high fever, change in your voice you need to be seen immediately.      ED Prescriptions     Medication Sig Dispense Auth. Provider   amoxicillin-clavulanate (AUGMENTIN) 875-125 MG tablet Take 1 tablet by mouth every 12 (twelve) hours. 20 tablet Leoncio Hansen K, PA-C   ibuprofen (ADVIL) 800 MG tablet Take 1 tablet (800 mg total) by mouth 3 (three) times daily. 21 tablet Latoyna Hird K, PA-C   lidocaine (XYLOCAINE) 2 % solution Use as directed 15 mLs in the mouth or throat every 6 (six) hours as needed for mouth pain. Swish and spit 100 mL Charidy Cappelletti K, PA-C      PDMP not reviewed this encounter.   Jeani Hawking, PA-C 03/11/23 1516

## 2023-03-11 NOTE — Discharge Instructions (Signed)
Start Augmentin twice daily for 10 days.  Use ibuprofen 800 mg up to 3 times a day for pain relief.  You should avoid NSAIDs including aspirin, ibuprofen/Advil, naproxen/Aleve with this medication as it causes stomach bleeding.  Gargle with warm salt water for additional symptom relief.  Use viscous lidocaine up to 4 times a day.  Do not eat or drink immediately after using this medication as it increases your risk of choking.  You should follow-up with dentist; call to schedule an appointment.  If you develop any worsening symptoms including difficulty swallowing, difficulty speaking, swelling of your throat, high fever, change in your voice you need to be seen immediately.

## 2023-07-17 DIAGNOSIS — Z3202 Encounter for pregnancy test, result negative: Secondary | ICD-10-CM | POA: Diagnosis not present

## 2023-07-17 DIAGNOSIS — N760 Acute vaginitis: Secondary | ICD-10-CM | POA: Diagnosis not present

## 2023-07-17 DIAGNOSIS — Z114 Encounter for screening for human immunodeficiency virus [HIV]: Secondary | ICD-10-CM | POA: Diagnosis not present

## 2023-07-17 DIAGNOSIS — Z113 Encounter for screening for infections with a predominantly sexual mode of transmission: Secondary | ICD-10-CM | POA: Diagnosis not present

## 2023-11-21 DIAGNOSIS — Z30011 Encounter for initial prescription of contraceptive pills: Secondary | ICD-10-CM | POA: Diagnosis not present

## 2024-01-21 DIAGNOSIS — N760 Acute vaginitis: Secondary | ICD-10-CM | POA: Diagnosis not present

## 2024-01-21 DIAGNOSIS — Z113 Encounter for screening for infections with a predominantly sexual mode of transmission: Secondary | ICD-10-CM | POA: Diagnosis not present

## 2024-01-21 DIAGNOSIS — N390 Urinary tract infection, site not specified: Secondary | ICD-10-CM | POA: Diagnosis not present

## 2024-02-26 DIAGNOSIS — Z3201 Encounter for pregnancy test, result positive: Secondary | ICD-10-CM | POA: Diagnosis not present

## 2024-03-25 ENCOUNTER — Other Ambulatory Visit: Payer: Self-pay | Admitting: Nurse Practitioner

## 2024-03-25 DIAGNOSIS — R011 Cardiac murmur, unspecified: Secondary | ICD-10-CM | POA: Diagnosis not present

## 2024-03-25 DIAGNOSIS — A6000 Herpesviral infection of urogenital system, unspecified: Secondary | ICD-10-CM | POA: Diagnosis not present

## 2024-03-25 DIAGNOSIS — Z62810 Personal history of physical and sexual abuse in childhood: Secondary | ICD-10-CM | POA: Diagnosis not present

## 2024-03-25 DIAGNOSIS — Z3481 Encounter for supervision of other normal pregnancy, first trimester: Secondary | ICD-10-CM | POA: Diagnosis not present

## 2024-03-25 DIAGNOSIS — O99019 Anemia complicating pregnancy, unspecified trimester: Secondary | ICD-10-CM | POA: Diagnosis not present

## 2024-03-25 DIAGNOSIS — Z8759 Personal history of other complications of pregnancy, childbirth and the puerperium: Secondary | ICD-10-CM | POA: Diagnosis not present

## 2024-03-25 DIAGNOSIS — R112 Nausea with vomiting, unspecified: Secondary | ICD-10-CM | POA: Diagnosis not present

## 2024-03-25 DIAGNOSIS — Z8751 Personal history of pre-term labor: Secondary | ICD-10-CM | POA: Diagnosis not present

## 2024-03-25 DIAGNOSIS — Z36 Encounter for antenatal screening for chromosomal anomalies: Secondary | ICD-10-CM

## 2024-03-25 DIAGNOSIS — Z113 Encounter for screening for infections with a predominantly sexual mode of transmission: Secondary | ICD-10-CM | POA: Diagnosis not present

## 2024-03-25 DIAGNOSIS — F17200 Nicotine dependence, unspecified, uncomplicated: Secondary | ICD-10-CM | POA: Diagnosis not present

## 2024-03-25 DIAGNOSIS — Z01419 Encounter for gynecological examination (general) (routine) without abnormal findings: Secondary | ICD-10-CM | POA: Diagnosis not present

## 2024-03-25 LAB — HBV PRENATAL SCREEN
Hep B Core Total Ab: NONREACTIVE
Hep B S Ab: NONREACTIVE
Hepatitis B Surface Ag: NEGATIVE

## 2024-03-25 LAB — OB RESULTS CONSOLE PLATELET COUNT: Platelets: 317

## 2024-03-25 LAB — HEPATITIS C ANTIBODY: HCV Ab: NEGATIVE

## 2024-03-25 LAB — OB RESULTS CONSOLE ABO/RH: RH Type: POSITIVE

## 2024-03-25 LAB — OB RESULTS CONSOLE RUBELLA ANTIBODY, IGM: Rubella: IMMUNE

## 2024-03-25 LAB — CYTOLOGY - PAP: Pap: ABNORMAL — AB

## 2024-03-25 LAB — OB RESULTS CONSOLE GC/CHLAMYDIA
Chlamydia: NEGATIVE
Neisseria Gonorrhea: NEGATIVE

## 2024-03-25 LAB — OB RESULTS CONSOLE HIV ANTIBODY (ROUTINE TESTING): HIV: NONREACTIVE

## 2024-03-25 LAB — OB RESULTS CONSOLE HGB/HCT, BLOOD
HCT: 31 (ref 29–41)
Hemoglobin: 10.3

## 2024-03-25 LAB — OB RESULTS CONSOLE RPR: RPR: NONREACTIVE

## 2024-03-25 LAB — OB RESULTS CONSOLE ANTIBODY SCREEN: Antibody Screen: NEGATIVE

## 2024-04-08 DIAGNOSIS — F1290 Cannabis use, unspecified, uncomplicated: Secondary | ICD-10-CM | POA: Insufficient documentation

## 2024-04-08 DIAGNOSIS — R87612 Low grade squamous intraepithelial lesion on cytologic smear of cervix (LGSIL): Secondary | ICD-10-CM | POA: Insufficient documentation

## 2024-04-08 DIAGNOSIS — F17200 Nicotine dependence, unspecified, uncomplicated: Secondary | ICD-10-CM | POA: Insufficient documentation

## 2024-04-11 NOTE — Progress Notes (Deleted)
 History:   Cindy [name] is a 28 y.o. H2E8776 at [redacted]w[redacted]d by {Ob dating:14516} being seen today for her first obstetrical visit.  Her obstetrical history is significant for {ob risk factors:10154}. Patient {does/does not:19097} intend to breast feed. Pregnancy history fully reviewed.  Patient reports {sx:14538}.      HISTORY: OB History  Gravida Para Term Preterm AB Living  7 3 1 2 2 3   SAB IAB Ectopic Multiple Live Births  1 0 1 0 3    # Outcome Date GA Lbr Len/2nd Weight Sex Type Anes PTL Lv  7 Current           6 SAB 07/2022          5 Preterm 11/20/20 [redacted]w[redacted]d  3 lb 8.1 oz (1.59 kg) M Vag-Spont None  LIV     Name: MARIECLAIRE, BETTENHAUSEN     Apgar1: 9  Apgar5: 9  4 Preterm 09/10/19 [redacted]w[redacted]d 01:32 / 00:05 4 lb 5.1 oz (1.96 kg) F Vag-Spont EPI  LIV     Birth Comments: severe preeclampsia, IUGR     Name: [facility]     Apgar1: 8  Apgar5: 8  3 Term 05/31/16 [redacted]w[redacted]d 16:30 / 06:05 6 lb 12.3 oz (3.07 kg) F Vag-Spont EPI  LIV     Name: Portal,GIRL Kaizlee     Apgar1: 9  Apgar5: 9  2 Ectopic 2016          1 Gravida             Last pap smear was done *** and was {Normal/Abnormal Appearance:21344::normal}  Past Medical History:  Diagnosis Date   Asthma    Heart murmur    Infection    UTI   Kidney stone    Kidney stones    Preeclampsia    Past Surgical History:  Procedure Laterality Date   NO PAST SURGERIES     Family History  Problem Relation Age of Onset   Healthy Mother    Healthy Father    Social History   Tobacco Use   Smoking status: Every Day    Current packs/day: 0.15    Types: Cigarettes   Smokeless tobacco: Never  Vaping Use   Vaping status: Never Used  Substance Use Topics   Alcohol use: No   Drug use: Yes    Types: Marijuana    Comment: 08/2020   Allergies  Allergen Reactions   Acetaminophen  Anaphylaxis and Swelling    Throat swells, couldn't breath.   No current outpatient medications on file prior to visit.   No current  facility-administered medications on file prior to visit.    *** Indications for ASA therapy (per uptodate) One of the following: Previous pregnancy with preeclampsia, especially early onset and with an adverse outcome {yes/no:20286} Multifetal gestation {yes/no:20286} Chronic hypertension {yes/no:20286} Type 1 or 2 diabetes mellitus {yes/no:20286} Chronic kidney disease {yes/no:20286} Autoimmune disease (antiphospholipid syndrome, systemic lupus erythematosus) {yes/no:20286}  *** Two or more of the following: Nulliparity {yes/no:20286} Obesity (body mass index >30 kg/m2) {yes/no:20286} Family history of preeclampsia in mother or sister {yes/no:20286} Age >=35 years {yes/no:20286} Sociodemographic characteristics (African American race, low socioeconomic level) {yes/no:20286} Personal risk factors (eg, previous pregnancy with low birth weight or small for gestational age infant, previous adverse pregnancy outcome [eg, stillbirth], interval >10 years between pregnancies) {yes/no:20286}  *** Indications for early 1 hour GTT (per uptodate)  BMI >25 (>23 in Asian women) AND one of the following  Gestational diabetes mellitus in a previous pregnancy {  yes/no:20286} Glycated hemoglobin >=5.7 percent (39 mmol/mol), impaired glucose tolerance, or impaired fasting glucose on previous testing {yes/no:20286} First-degree relative with diabetes {yes/no:20286} High-risk race/ethnicity (eg, African American, Latino, Native American, [place] American, [place] Islander) {yes/no:20286} History of cardiovascular disease {yes/no:20286} Hypertension or on therapy for hypertension {yes/no:20286} High-density lipoprotein cholesterol level <35 mg/dL (9.09 mmol/L) and/or a triglyceride level >250 mg/dL (7.17 mmol/L) {bzd/wn:79713} Polycystic ovary syndrome {yes/no:20286} Physical inactivity {yes/no:20286} Other clinical condition associated with insulin resistance (eg, severe obesity, acanthosis nigricans)  {yes/no:20286} Previous birth of an infant weighing >=4000 g {yes/no:20286} Previous stillbirth of unknown cause {yes/no:20286}  Review of Systems Pertinent items noted in HPI and remainder of comprehensive ROS otherwise negative. Physical Exam:  There were no vitals filed for this visit.    Constitutional: Well-developed, well-nourished pregnant female in no acute distress.  HEENT: PERRLA Skin: normal color and turgor, no rash Cardiovascular: normal rate & rhythm, warm and well perfused Respiratory: normal effort, no problems with respiration noted GI: Abd soft, non-distended MS: Extremities nontender, no edema, normal ROM Neurologic: Alert and oriented x 4.  GU: no CVA tenderness Pelvic: NEFG, physiologic discharge, no blood, cervix clean. 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 Supervision of high risk pregnancy in first trimester (Primary) ***  2. History of gestational hypertension ***  3. History of preterm premature rupture of membranes (PPROM) ***  4. History of prior pregnancy with IUGR newborn ***  5. HSV-2 infection ***  6. Hx Preterm delivery ***  7. Marijuana use ***  8. Smoker ***  9. LGSIL on Pap smear of cervix ***  10. [redacted] weeks gestation of pregnancy ***   - Initial labs drawn. - Continue prenatal vitamins. - Problem list reviewed and updated. - Genetic Screening discussed, {Blank multiple:19196::First trimester screen,Quad screen,NIPS}:  {requests/ordered/declines:14581}. - Ultrasound discussed; fetal anatomic survey: {requests/ordered/declines:14581}. - Anticipatory guidance about prenatal visits given including labs, ultrasounds, and testing. - Discussed usage of Babyscripts and virtual visits as additional source of managing and completing prenatal visits in midst of coronavirus and pandemic.   - Encouraged to complete MyChart Registration for her ability to review results, send requests, and have questions addressed.  - The nature of [place] [facility] with multiple MDs and Advanced Practice Providers was explained to patient; also emphasized that residents, students are part of our team. - Routine obstetric precautions reviewed. Encouraged to seek out care at office or emergency room [facility] preferred) for urgent and/or emergent concerns.  No follow-ups on file.    Future Appointments  Date Time Provider Department Center  04/12/2024  9:55 AM Wallace Joesph LABOR, PA [facility]    Joesph LABOR Wallace, [place]

## 2024-04-12 ENCOUNTER — Encounter: Admitting: Family Medicine

## 2024-04-12 DIAGNOSIS — F1290 Cannabis use, unspecified, uncomplicated: Secondary | ICD-10-CM

## 2024-04-12 DIAGNOSIS — Z8759 Personal history of other complications of pregnancy, childbirth and the puerperium: Secondary | ICD-10-CM

## 2024-04-12 DIAGNOSIS — F17200 Nicotine dependence, unspecified, uncomplicated: Secondary | ICD-10-CM

## 2024-04-12 DIAGNOSIS — B009 Herpesviral infection, unspecified: Secondary | ICD-10-CM

## 2024-04-12 DIAGNOSIS — O0991 Supervision of high risk pregnancy, unspecified, first trimester: Secondary | ICD-10-CM

## 2024-04-12 DIAGNOSIS — Z3A15 15 weeks gestation of pregnancy: Secondary | ICD-10-CM

## 2024-04-12 DIAGNOSIS — R87612 Low grade squamous intraepithelial lesion on cytologic smear of cervix (LGSIL): Secondary | ICD-10-CM

## 2024-04-20 ENCOUNTER — Encounter: Payer: Self-pay | Admitting: Family Medicine

## 2024-04-20 ENCOUNTER — Ambulatory Visit: Admitting: Family Medicine

## 2024-04-20 ENCOUNTER — Other Ambulatory Visit: Payer: Self-pay

## 2024-04-20 VITALS — BP 104/68 | HR 92 | Wt 132.0 lb

## 2024-04-20 DIAGNOSIS — Z3A16 16 weeks gestation of pregnancy: Secondary | ICD-10-CM | POA: Diagnosis not present

## 2024-04-20 DIAGNOSIS — Z8759 Personal history of other complications of pregnancy, childbirth and the puerperium: Secondary | ICD-10-CM

## 2024-04-20 DIAGNOSIS — F17200 Nicotine dependence, unspecified, uncomplicated: Secondary | ICD-10-CM

## 2024-04-20 DIAGNOSIS — R011 Cardiac murmur, unspecified: Secondary | ICD-10-CM

## 2024-04-20 DIAGNOSIS — R87612 Low grade squamous intraepithelial lesion on cytologic smear of cervix (LGSIL): Secondary | ICD-10-CM | POA: Diagnosis not present

## 2024-04-20 DIAGNOSIS — B009 Herpesviral infection, unspecified: Secondary | ICD-10-CM

## 2024-04-20 DIAGNOSIS — Z3480 Encounter for supervision of other normal pregnancy, unspecified trimester: Secondary | ICD-10-CM | POA: Diagnosis not present

## 2024-04-20 DIAGNOSIS — Z3482 Encounter for supervision of other normal pregnancy, second trimester: Secondary | ICD-10-CM

## 2024-04-20 MED ORDER — ASPIRIN 81 MG PO CHEW: 81.0000 mg | CHEWABLE_TABLET | Freq: Every day | ORAL | 3 refills | Status: AC

## 2024-04-20 NOTE — Progress Notes (Signed)
 Subjective:   Cindy [name] is a 28 y.o. [id number] at [redacted]w[redacted]d by LMP being seen today for her first obstetrical visit.  Her obstetrical history is significant for pre-eclampsia, smoker, and h/o PPROM with 32 week delivery, h/o IUGR.  Pregnancy history fully reviewed.  Patient reports no complaints.  HISTORY: OB History  Gravida Para Term Preterm AB Living  7 3 1 2 3 3   SAB IAB Ectopic Multiple Live Births  2 0 1 0 3    # Outcome Date GA Lbr Len/2nd Weight Sex Type Anes PTL Lv  7 Current           6 SAB 07/2022          5 Preterm 11/20/20 [redacted]w[redacted]d  3 lb 8.1 oz (1.59 kg) M Vag-Spont None  LIV     Name: Cindy, [name]     Apgar1: 9  Apgar5: 9  4 Preterm 09/10/19 [redacted]w[redacted]d 01:32 / 00:05 4 lb 5.1 oz (1.96 kg) F Vag-Spont EPI  LIV     Birth Comments: severe preeclampsia, IUGR     Name: Cindy [name]     Apgar1: 8  Apgar5: 8  3 Term 05/31/16 [redacted]w[redacted]d 16:30 / 06:05 6 lb 12.3 oz (3.07 kg) F Vag-Spont EPI  LIV     Name: Cindy [name]     Apgar1: 9  Apgar5: 9  2 Ectopic 2016          1 SAB            Last pap smear was  04/12/2024 and was abnormal - LGSIL Past Medical History:  Diagnosis Date   Asthma    Heart murmur    Infection    UTI   Kidney stone    Kidney stones    Preeclampsia    Past Surgical History:  Procedure Laterality Date   NO PAST SURGERIES     Family History  Problem Relation Age of Onset   Healthy Mother    Healthy Father    Hypertension Maternal Grandmother    Social History   Tobacco Use   Smoking status: Every Day    Current packs/day: 0.15    Types: Cigarettes   Smokeless tobacco: Never  Vaping Use   Vaping status: Never Used  Substance Use Topics   Alcohol use: No   Drug use: Yes    Types: Marijuana    Comment: 08/2020   Allergies  Allergen Reactions   Acetaminophen  Anaphylaxis and Swelling    Throat swells, couldn't breath.   Current Outpatient Medications on File Prior to Visit  Medication Sig Dispense Refill    Prenatal Vit-Fe Fumarate-FA (MULTIVITAMIN-PRENATAL) 27-0.8 MG TABS tablet Take 1 tablet by mouth daily at 12 noon.     No current facility-administered medications on file prior to visit.     Exam   Vitals:   04/20/24 1509  BP: 104/68  Pulse: 92  Weight: 132 lb (59.9 kg)   Fetal Heart Rate (bpm): 157  System: General: well-developed, well-nourished female in no acute distress   Skin: normal coloration and turgor, no rashes   Neurologic: oriented, normal, negative, normal mood   Extremities: normal strength, tone, and muscle mass, ROM of all joints is normal   HEENT PERRLA, extraocular movement intact and sclera clear, anicteric   Mouth/Teeth mucous membranes moist, pharynx normal without lesions and dental hygiene good   Neck supple and no masses   Cardiovascular: regular rate and rhythm 2/6 SEM   Respiratory:  no  respiratory distress, normal breath sounds   Abdomen: soft, non-tender; bowel sounds normal; no masses,  no organomegaly     Assessment:   Pregnancy: [id number] Patient Active Problem List   Diagnosis Date Noted   Supervision of other normal pregnancy, antepartum 04/20/2024   LGSIL on Pap smear of cervix 04/08/2024   Smoker 04/08/2024   Marijuana use 04/08/2024   Hx Preterm delivery 11/22/2020   History of preterm premature rupture of membranes (PPROM) 11/18/2020   History of gestational hypertension 06/26/2020   History of prior pregnancy with IUGR newborn 06/26/2020   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 Supervision of other normal pregnancy, antepartum (Primary) AFP and A1C today Needs anatomy u/s - AFP, Serum, Open Spina Bifida - US  MFM OB DETAIL +14 WK; Future - Hemoglobin A1c  2. History of preterm premature rupture of membranes (PPROM) In short interval pregnancy CL with anatomy u/s  3. History of gestational hypertension Baseline labs and ASA daily - Protein / creatinine ratio, urine -  Comprehensive metabolic panel with GFR - aspirin  81 MG chewable tablet; Chew 1 tablet (81 mg total) by mouth daily.  Dispense: 90 tablet; Refill: 3  4. History of prior pregnancy with IUGR newborn Discussed smoking and decreasing this  5. HSV-2 infection No frequent outbreaks, will needppx 34-36 weeks  6. LGSIL on Pap smear of cervix Needs colpo  7. Smoker Cessation advised  8. MURMUR Noted on heart exam. No symptoms. Consider cardio ob referral or ECHO.   Initial labs drawn. Continue prenatal vitamins. Genetic Screening discussed, NIPS: results reviewed. Ultrasound discussed; fetal anatomic survey: ordered. Problem list reviewed and updated.  Routine obstetric precautions reviewed. Return in 4 weeks (on 05/18/2024) for [facility], needs colpo.

## 2024-04-23 LAB — COMPREHENSIVE METABOLIC PANEL WITH GFR
ALT: 8 IU/L (ref 0–32)
AST: 14 IU/L (ref 0–40)
Albumin: 3.6 g/dL — ABNORMAL LOW (ref 4.0–5.0)
Alkaline Phosphatase: 59 IU/L (ref 41–116)
BUN/Creatinine Ratio: 14 (ref 9–23)
BUN: 8 mg/dL (ref 6–20)
Bilirubin Total: 0.2 mg/dL (ref 0.0–1.2)
CO2: 22 mmol/L (ref 20–29)
Calcium: 8.9 mg/dL (ref 8.7–10.2)
Chloride: 102 mmol/L (ref 96–106)
Creatinine, Ser: 0.57 mg/dL (ref 0.57–1.00)
Globulin, Total: 2.4 g/dL (ref 1.5–4.5)
Glucose: 79 mg/dL (ref 70–99)
Potassium: 4.5 mmol/L (ref 3.5–5.2)
Sodium: 135 mmol/L (ref 134–144)
Total Protein: 6 g/dL (ref 6.0–8.5)
eGFR: 125 mL/min/1.73 (ref >59)

## 2024-04-23 LAB — AFP, SERUM, OPEN SPINA BIFIDA
AFP MoM: 1.04
AFP Value: 41.3 ng/mL
Gest. Age on Collection Date: 16.1 wk
Maternal Age At EDD: 30.7 a
OSBR Risk 1 IN: 10000
Test Results:: NEGATIVE
Weight: 132 [lb_av]

## 2024-04-23 LAB — HEMOGLOBIN A1C
Est. average glucose Bld gHb Est-mCnc: 97 mg/dL
Hgb A1c MFr Bld: 5 % (ref 4.8–5.6)

## 2024-05-13 ENCOUNTER — Encounter: Payer: Self-pay | Admitting: Family Medicine

## 2024-05-14 ENCOUNTER — Ambulatory Visit

## 2024-05-14 ENCOUNTER — Other Ambulatory Visit

## 2024-05-25 ENCOUNTER — Encounter: Payer: Self-pay | Admitting: Obstetrics and Gynecology

## 2024-05-25 ENCOUNTER — Telehealth: Admitting: Obstetrics and Gynecology

## 2024-05-25 DIAGNOSIS — O98812 Other maternal infectious and parasitic diseases complicating pregnancy, second trimester: Secondary | ICD-10-CM | POA: Diagnosis not present

## 2024-05-25 DIAGNOSIS — Z3A21 21 weeks gestation of pregnancy: Secondary | ICD-10-CM | POA: Diagnosis not present

## 2024-05-25 DIAGNOSIS — Z3480 Encounter for supervision of other normal pregnancy, unspecified trimester: Secondary | ICD-10-CM

## 2024-05-25 DIAGNOSIS — Z8759 Personal history of other complications of pregnancy, childbirth and the puerperium: Secondary | ICD-10-CM

## 2024-05-25 DIAGNOSIS — B009 Herpesviral infection, unspecified: Secondary | ICD-10-CM | POA: Diagnosis not present

## 2024-05-25 DIAGNOSIS — O282 Abnormal cytological finding on antenatal screening of mother: Secondary | ICD-10-CM

## 2024-05-25 DIAGNOSIS — R87612 Low grade squamous intraepithelial lesion on cytologic smear of cervix (LGSIL): Secondary | ICD-10-CM

## 2024-05-25 DIAGNOSIS — O09292 Supervision of pregnancy with other poor reproductive or obstetric history, second trimester: Secondary | ICD-10-CM | POA: Diagnosis not present

## 2024-05-25 NOTE — Progress Notes (Signed)
    TELEHEALTH OBSTETRICS VISIT ENCOUNTER NOTE  Provider location: [facility] for [facility]   Patient location: Home  I connected with Cindy [name] on 05/25/24 at 10:15 AM EST by telephone at home and verified that I am speaking with the correct person using two identifiers. Of note, unable to do video encounter due to technical difficulties.    I discussed the limitations, risks, security and privacy concerns of performing an evaluation and management service by telephone and the availability of in person appointments. I also discussed with the patient that there may be a patient responsible charge related to this service. The patient expressed understanding and agreed to proceed.  Subjective:  Cindy [name] is a 28 y.o. [id number] at 106w1d being followed for ongoing prenatal care.  She is currently monitored for the following issues for this high-risk pregnancy and has MURMUR; TINEA VERSICOLOR; HSV-2 infection; History of gestational hypertension; History of prior pregnancy with IUGR newborn; History of preterm premature rupture of membranes (PPROM); Hx Preterm delivery; LGSIL on Pap smear of cervix; Smoker; Marijuana use; and Supervision of other normal pregnancy, antepartum on their problem list.  Patient reports nausea. Reports fetal movement. Denies any contractions, bleeding or leaking of fluid.   The following portions of the patient's history were reviewed and updated as appropriate: allergies, current medications, past family history, past medical history, past social history, past surgical history and problem list.   Objective:  Last menstrual period 12/29/2023, unknown if currently breastfeeding. General:  Alert, oriented and cooperative.   Mental Status: Normal mood and affect perceived. Normal judgment and thought content.  Rest of physical exam deferred due to type of encounter  Assessment and Plan:  Pregnancy: [id number] at [redacted]w[redacted]d  1. LGSIL  on Pap smear of cervix (Primary) Reviewed colpo, pt declines during pregnancy Will need f/u after delivery  2. Hx Preterm delivery  3. History of preterm premature rupture of membranes (PPROM) 32 weeks delivery  4. History of prior pregnancy with IUGR newborn  5. History of gestational hypertension No BP cuff with her today, encouraged her to take it and send via mychart  6. HSV-2 infection Will need ppx 34 weeks  7. Supervision of other normal pregnancy, antepartum Anatomy scheduledf ro 12/23  8. [redacted] weeks gestation of pregnancy    Preterm labor symptoms and general obstetric precautions including but not limited to vaginal bleeding, contractions, leaking of fluid and fetal movement were reviewed in detail with the patient.  I discussed the assessment and treatment plan with the patient. The patient was provided an opportunity to ask questions and all were answered. The patient agreed with the plan and demonstrated an understanding of the instructions. The patient was advised to call back or seek an in-person office evaluation/go to [facility] at [facility] for any urgent or concerning symptoms. Please refer to After Visit Summary for other counseling recommendations.   I provided 12 minutes of non-face-to-face time during this encounter.  Return in about 1 month (around 06/25/2024) for high OB.  Future Appointments  Date Time Provider Department Center  06/15/2024  7:00 AM [facility] PROVIDER 1 [facility]  06/15/2024  7:30 AM [facility] US4 [facility]  06/28/2024  4:15 PM Zina Jerilynn LABOR, MD [facility]    Burnard CHRISTELLA Moats, MD Center for [facility]

## 2024-06-15 ENCOUNTER — Ambulatory Visit: Attending: Family Medicine

## 2024-06-15 ENCOUNTER — Other Ambulatory Visit: Payer: Self-pay | Admitting: *Deleted

## 2024-06-15 ENCOUNTER — Ambulatory Visit: Admitting: Obstetrics and Gynecology

## 2024-06-15 ENCOUNTER — Other Ambulatory Visit: Payer: Self-pay | Admitting: Family Medicine

## 2024-06-15 VITALS — BP 106/56

## 2024-06-15 DIAGNOSIS — R87612 Low grade squamous intraepithelial lesion on cytologic smear of cervix (LGSIL): Secondary | ICD-10-CM | POA: Insufficient documentation

## 2024-06-15 DIAGNOSIS — O09899 Supervision of other high risk pregnancies, unspecified trimester: Secondary | ICD-10-CM | POA: Diagnosis not present

## 2024-06-15 DIAGNOSIS — F1290 Cannabis use, unspecified, uncomplicated: Secondary | ICD-10-CM | POA: Diagnosis not present

## 2024-06-15 DIAGNOSIS — Z3A24 24 weeks gestation of pregnancy: Secondary | ICD-10-CM

## 2024-06-15 DIAGNOSIS — O4452 Low lying placenta with hemorrhage, second trimester: Secondary | ICD-10-CM

## 2024-06-15 DIAGNOSIS — O09292 Supervision of pregnancy with other poor reproductive or obstetric history, second trimester: Secondary | ICD-10-CM | POA: Diagnosis not present

## 2024-06-15 DIAGNOSIS — O09892 Supervision of other high risk pregnancies, second trimester: Secondary | ICD-10-CM

## 2024-06-15 DIAGNOSIS — Z3480 Encounter for supervision of other normal pregnancy, unspecified trimester: Secondary | ICD-10-CM

## 2024-06-15 DIAGNOSIS — O99322 Drug use complicating pregnancy, second trimester: Secondary | ICD-10-CM | POA: Diagnosis not present

## 2024-06-15 DIAGNOSIS — O09212 Supervision of pregnancy with history of pre-term labor, second trimester: Secondary | ICD-10-CM | POA: Diagnosis not present

## 2024-06-15 DIAGNOSIS — O4442 Low lying placenta NOS or without hemorrhage, second trimester: Secondary | ICD-10-CM

## 2024-06-15 DIAGNOSIS — F1210 Cannabis abuse, uncomplicated: Secondary | ICD-10-CM | POA: Diagnosis not present

## 2024-06-15 DIAGNOSIS — O358XX0 Maternal care for other (suspected) fetal abnormality and damage, not applicable or unspecified: Secondary | ICD-10-CM

## 2024-06-15 DIAGNOSIS — Z363 Encounter for antenatal screening for malformations: Secondary | ICD-10-CM | POA: Diagnosis not present

## 2024-06-15 DIAGNOSIS — F17200 Nicotine dependence, unspecified, uncomplicated: Secondary | ICD-10-CM | POA: Insufficient documentation

## 2024-06-15 NOTE — Progress Notes (Signed)
 Maternal-Fetal Medicine Consultation  Name: Cindy [name]  MRN: [id number]  GA: [id number] [redacted]w[redacted]d   Patient is here for fetal anatomy scan.  On cell-free fetal DNA screening, the risks of fetal aneuploidies are not increased.  MSAFP screening showed low risk for open neural tube defects. Obstetric history - 2017: Term vaginal delivery of a female infant weighing 6 pounds 12 ounces at birth. - 2021: Preterm vaginal delivery at [redacted] weeks gestation of a female infant weighing 4 pounds and 5 ounces at birth.  Her pregnancy was complicated by gestational hypertension. - 2022: Patient had PPROM at [redacted] weeks gestation and delivered a female infant weighing 3 pounds and 8 ounces at birth. All her children are in good health. She does not have any chronic medical conditions including diabetes or hypertension.  Ultrasound Fetal biometry is consistent with the previously established dates.  Normal amniotic fluid.  No markers of aneuploidies or obvious fetal structural defects are seen. Because of her history of preterm deliveries, we performed a transvaginal ultrasound to evaluate the cervix.  The cervix measures 4.6 cm, which is normal.  No shortening or funneling was seen on transfundal pressure.  The placental edge was 5 millimeters from the internal os (low-lying placenta).  History of preterm deliveries I counseled the patient that history of preterm deliveries is the most common cause of recurrent preterm delivery.  History of PPROM is associated with about 13% likelihood of recurrent preterm (as opposed to 4%).  I reassured the patient of normal cervical length measurements.  We do not recommend vaginal progesterone with normal cervical length measurements.  17 alpha hydroxyprogesterone has been withdrawn from the market after FDA disapproval.  Low-lying placenta I reassured the patient that low-lying placenta resolves with advancing gestation.  Patient does not give history of vaginal bleeding.   We will reassess the placental position in 8 weeks.  History of gestational hypertension Patient takes low-dose aspirin  prophylaxis.  I counseled the patient that low-dose aspirin  delays or prevents preeclampsia.  Recurrence of preeclampsia is seen after 25 to 50%.  Patient has a different problem.   Recommendations -An appointment was made for her to return in 8 weeks for fetal growth assessment and evaluate the placental position.    Consultation including face-to-face (more than 50%) counseling 35 minutes.

## 2024-06-21 ENCOUNTER — Ambulatory Visit: Payer: Self-pay | Admitting: Family Medicine

## 2024-06-21 DIAGNOSIS — O4440 Low lying placenta NOS or without hemorrhage, unspecified trimester: Secondary | ICD-10-CM | POA: Insufficient documentation

## 2024-06-21 DIAGNOSIS — Z3480 Encounter for supervision of other normal pregnancy, unspecified trimester: Secondary | ICD-10-CM

## 2024-06-28 ENCOUNTER — Encounter: Admitting: Obstetrics and Gynecology

## 2024-07-15 ENCOUNTER — Other Ambulatory Visit: Payer: Self-pay

## 2024-07-15 DIAGNOSIS — Z3480 Encounter for supervision of other normal pregnancy, unspecified trimester: Secondary | ICD-10-CM

## 2024-07-19 ENCOUNTER — Other Ambulatory Visit: Payer: Self-pay

## 2024-07-19 ENCOUNTER — Encounter: Payer: Self-pay | Admitting: Obstetrics and Gynecology

## 2024-07-22 ENCOUNTER — Encounter: Payer: Self-pay | Admitting: Family Medicine

## 2024-07-27 ENCOUNTER — Other Ambulatory Visit

## 2024-07-27 ENCOUNTER — Encounter: Admitting: Obstetrics and Gynecology

## 2024-07-30 ENCOUNTER — Other Ambulatory Visit

## 2024-07-30 ENCOUNTER — Encounter: Admitting: Obstetrics and Gynecology

## 2024-07-30 ENCOUNTER — Other Ambulatory Visit: Admitting: Obstetrics and Gynecology

## 2024-08-10 ENCOUNTER — Ambulatory Visit

## 2024-08-10 DIAGNOSIS — F1290 Cannabis use, unspecified, uncomplicated: Secondary | ICD-10-CM

## 2024-08-10 DIAGNOSIS — O4440 Low lying placenta NOS or without hemorrhage, unspecified trimester: Secondary | ICD-10-CM

## 2024-08-10 DIAGNOSIS — Z3480 Encounter for supervision of other normal pregnancy, unspecified trimester: Secondary | ICD-10-CM

## 2024-08-10 DIAGNOSIS — R87612 Low grade squamous intraepithelial lesion on cytologic smear of cervix (LGSIL): Secondary | ICD-10-CM

## 2024-08-10 DIAGNOSIS — F17200 Nicotine dependence, unspecified, uncomplicated: Secondary | ICD-10-CM

## 2024-08-26 ENCOUNTER — Encounter: Payer: Self-pay | Admitting: Family Medicine

## 2024-09-09 ENCOUNTER — Encounter: Payer: Self-pay | Admitting: Obstetrics and Gynecology

## 2024-09-23 ENCOUNTER — Encounter: Payer: Self-pay | Admitting: Obstetrics and Gynecology

## 2024-09-30 ENCOUNTER — Encounter: Payer: Self-pay | Admitting: Obstetrics and Gynecology

## 2024-10-07 ENCOUNTER — Encounter: Payer: Self-pay | Admitting: Obstetrics & Gynecology
